# Patient Record
Sex: Female | Born: 1935 | Race: White | Hispanic: No | Marital: Single | Smoking: Never smoker
Health system: Southern US, Community
[De-identification: ages and names within clinical notes are randomized; demographics above are authoritative.]

## PROBLEM LIST (undated history)

## (undated) DIAGNOSIS — Z7901 Long term (current) use of anticoagulants: Secondary | ICD-10-CM

## (undated) DIAGNOSIS — I739 Peripheral vascular disease, unspecified: Secondary | ICD-10-CM

## (undated) DIAGNOSIS — M1990 Unspecified osteoarthritis, unspecified site: Secondary | ICD-10-CM

## (undated) DIAGNOSIS — E871 Hypo-osmolality and hyponatremia: Secondary | ICD-10-CM

## (undated) DIAGNOSIS — I10 Essential (primary) hypertension: Secondary | ICD-10-CM

## (undated) DIAGNOSIS — F0150 Vascular dementia without behavioral disturbance: Secondary | ICD-10-CM

## (undated) DIAGNOSIS — F411 Generalized anxiety disorder: Secondary | ICD-10-CM

## (undated) DIAGNOSIS — E876 Hypokalemia: Secondary | ICD-10-CM

## (undated) DIAGNOSIS — N183 Chronic kidney disease, stage 3 unspecified: Secondary | ICD-10-CM

## (undated) DIAGNOSIS — I2699 Other pulmonary embolism without acute cor pulmonale: Secondary | ICD-10-CM

## (undated) DIAGNOSIS — E785 Hyperlipidemia, unspecified: Secondary | ICD-10-CM

## (undated) DIAGNOSIS — Z86711 Personal history of pulmonary embolism: Secondary | ICD-10-CM

## (undated) DIAGNOSIS — K219 Gastro-esophageal reflux disease without esophagitis: Secondary | ICD-10-CM

## (undated) DIAGNOSIS — I252 Old myocardial infarction: Secondary | ICD-10-CM

## (undated) DIAGNOSIS — E039 Hypothyroidism, unspecified: Secondary | ICD-10-CM

## (undated) DIAGNOSIS — F329 Major depressive disorder, single episode, unspecified: Secondary | ICD-10-CM

## (undated) DIAGNOSIS — Z9181 History of falling: Secondary | ICD-10-CM

## (undated) HISTORY — DX: Unspecified osteoarthritis, unspecified site: M19.90

## (undated) HISTORY — DX: Gastro-esophageal reflux disease without esophagitis: K21.9

## (undated) HISTORY — DX: Major depressive disorder, single episode, unspecified: F32.9

## (undated) HISTORY — DX: Generalized anxiety disorder: F41.1

## (undated) HISTORY — DX: Vascular dementia without behavioral disturbance: F01.50

## (undated) HISTORY — DX: Vascular dementia, unspecified severity, without behavioral disturbance, psychotic disturbance, mood disturbance, and anxiety: F01.50

## (undated) HISTORY — DX: Chronic kidney disease, stage 3 (moderate): N18.3

## (undated) HISTORY — DX: Hypo-osmolality and hyponatremia: E87.1

## (undated) HISTORY — DX: Hyperlipidemia, unspecified: E78.5

## (undated) HISTORY — PX: JOINT REPLACEMENT: SHX530

## (undated) HISTORY — DX: Old myocardial infarction: I25.2

## (undated) HISTORY — DX: Peripheral vascular disease, unspecified: I73.9

## (undated) HISTORY — DX: Personal history of pulmonary embolism: Z86.711

## (undated) HISTORY — DX: Long term (current) use of anticoagulants: Z79.01

## (undated) HISTORY — DX: Other pulmonary embolism without acute cor pulmonale: I26.99

## (undated) HISTORY — DX: Chronic kidney disease, stage 3 unspecified: N18.30

## (undated) HISTORY — DX: Essential (primary) hypertension: I10

## (undated) HISTORY — DX: History of falling: Z91.81

## (undated) HISTORY — DX: Hypokalemia: E87.6

## (undated) HISTORY — DX: Hypothyroidism, unspecified: E03.9

---

## 2009-12-08 ENCOUNTER — Emergency Department (HOSPITAL_BASED_OUTPATIENT_CLINIC_OR_DEPARTMENT_OTHER)
Admission: EM | Admit: 2009-12-08 | Discharge: 2009-12-08 | Payer: Self-pay | Source: Home / Self Care | Admitting: Emergency Medicine

## 2010-04-04 ENCOUNTER — Ambulatory Visit: Payer: Self-pay | Admitting: Diagnostic Radiology

## 2010-04-04 ENCOUNTER — Emergency Department (HOSPITAL_BASED_OUTPATIENT_CLINIC_OR_DEPARTMENT_OTHER): Admission: EM | Admit: 2010-04-04 | Discharge: 2010-04-04 | Payer: Self-pay | Admitting: Emergency Medicine

## 2010-07-22 ENCOUNTER — Emergency Department (HOSPITAL_BASED_OUTPATIENT_CLINIC_OR_DEPARTMENT_OTHER)
Admission: EM | Admit: 2010-07-22 | Discharge: 2010-07-22 | Payer: Self-pay | Source: Home / Self Care | Admitting: Emergency Medicine

## 2010-08-28 ENCOUNTER — Emergency Department (HOSPITAL_BASED_OUTPATIENT_CLINIC_OR_DEPARTMENT_OTHER)
Admission: EM | Admit: 2010-08-28 | Discharge: 2010-08-29 | Disposition: A | Discharge status: Home / Self Care | Payer: MEDICARE | Attending: Emergency Medicine | Admitting: Emergency Medicine

## 2010-08-28 ENCOUNTER — Emergency Department (INDEPENDENT_AMBULATORY_CARE_PROVIDER_SITE_OTHER): Payer: MEDICARE

## 2010-08-28 DIAGNOSIS — E876 Hypokalemia: Secondary | ICD-10-CM | POA: Insufficient documentation

## 2010-08-28 DIAGNOSIS — K219 Gastro-esophageal reflux disease without esophagitis: Secondary | ICD-10-CM | POA: Insufficient documentation

## 2010-08-28 DIAGNOSIS — E785 Hyperlipidemia, unspecified: Secondary | ICD-10-CM | POA: Insufficient documentation

## 2010-08-28 DIAGNOSIS — E8779 Other fluid overload: Secondary | ICD-10-CM | POA: Insufficient documentation

## 2010-08-28 DIAGNOSIS — M810 Age-related osteoporosis without current pathological fracture: Secondary | ICD-10-CM | POA: Insufficient documentation

## 2010-08-28 DIAGNOSIS — E039 Hypothyroidism, unspecified: Secondary | ICD-10-CM | POA: Insufficient documentation

## 2010-08-28 DIAGNOSIS — I10 Essential (primary) hypertension: Secondary | ICD-10-CM | POA: Insufficient documentation

## 2010-08-28 DIAGNOSIS — F0390 Unspecified dementia without behavioral disturbance: Secondary | ICD-10-CM | POA: Insufficient documentation

## 2010-08-28 LAB — BASIC METABOLIC PANEL
Calcium: 9.6 mg/dL (ref 8.4–10.5)
Creatinine, Ser: 1 mg/dL (ref 0.4–1.2)
GFR calc non Af Amer: 54 mL/min — ABNORMAL LOW (ref >60)
Glucose, Bld: 89 mg/dL (ref 70–99)
Sodium: 138 mEq/L (ref 135–145)

## 2010-10-04 LAB — APTT: aPTT: 37 seconds (ref 24–37)

## 2010-10-04 LAB — URINALYSIS, ROUTINE W REFLEX MICROSCOPIC
Bilirubin Urine: NEGATIVE
Leukocytes, UA: NEGATIVE
Nitrite: NEGATIVE
Specific Gravity, Urine: 1.011 (ref 1.005–1.030)
Urobilinogen, UA: 0.2 mg/dL (ref 0.0–1.0)
pH: 6.5 (ref 5.0–8.0)

## 2010-10-04 LAB — DIFFERENTIAL
Basophils Absolute: 0.1 10*3/uL (ref 0.0–0.1)
Basophils Relative: 3 % — ABNORMAL HIGH (ref 0–1)
Eosinophils Absolute: 0 10*3/uL (ref 0.0–0.7)
Monocytes Absolute: 0.4 10*3/uL (ref 0.1–1.0)
Monocytes Relative: 9 % (ref 3–12)
Neutro Abs: 2.9 10*3/uL (ref 1.7–7.7)
Neutrophils Relative %: 66 % (ref 43–77)

## 2010-10-04 LAB — URINE MICROSCOPIC-ADD ON

## 2010-10-04 LAB — BASIC METABOLIC PANEL
BUN: 23 mg/dL (ref 6–23)
CO2: 30 mEq/L (ref 19–32)
Calcium: 9.5 mg/dL (ref 8.4–10.5)
Creatinine, Ser: 0.8 mg/dL (ref 0.4–1.2)
GFR calc non Af Amer: >60 mL/min (ref >60)
Glucose, Bld: 104 mg/dL — ABNORMAL HIGH (ref 70–99)

## 2010-10-04 LAB — URINE CULTURE

## 2010-10-04 LAB — CBC
MCH: 31.1 pg (ref 26.0–34.0)
MCHC: 34.8 g/dL (ref 30.0–36.0)
RDW: 13.7 % (ref 11.5–15.5)

## 2010-10-04 LAB — PROTIME-INR: INR: 1.83 — ABNORMAL HIGH (ref 0.00–1.49)

## 2010-10-08 LAB — CBC
HCT: 40.2 % (ref 36.0–46.0)
Hemoglobin: 14.1 g/dL (ref 12.0–15.0)
MCHC: 34.9 g/dL (ref 30.0–36.0)
Platelets: 275 10*3/uL (ref 150–400)
RDW: 13.1 % (ref 11.5–15.5)

## 2010-10-08 LAB — DIFFERENTIAL
Basophils Absolute: 0.1 10*3/uL (ref 0.0–0.1)
Basophils Relative: 1 % (ref 0–1)
Eosinophils Relative: 2 % (ref 0–5)
Lymphocytes Relative: 18 % (ref 12–46)
Monocytes Absolute: 0.5 10*3/uL (ref 0.1–1.0)

## 2011-01-15 IMAGING — CT CT HEAD W/O CM
4 of 7 series · 15 of 47 positions shown, 16 images · non-contrast
Comparison: None.

CT HEAD

CLINICAL DATA: Fall, pain

CT HEAD WITHOUT CONTRAST
CT CERVICAL SPINE WITHOUT CONTRAST
TECHNIQUE: Multidetector CT imaging of the head and cervical spine
was performed following the standard protocol without intravenous
contrast.  Multiplanar CT image reconstructions of the cervical
spine were also generated.

[Series 3: head 4.8 h37s · axial · 0.46mm/px · z∈[-134,+25]mm · 3 of 32 slices shown, 4 images]
[im 1/32  brain]
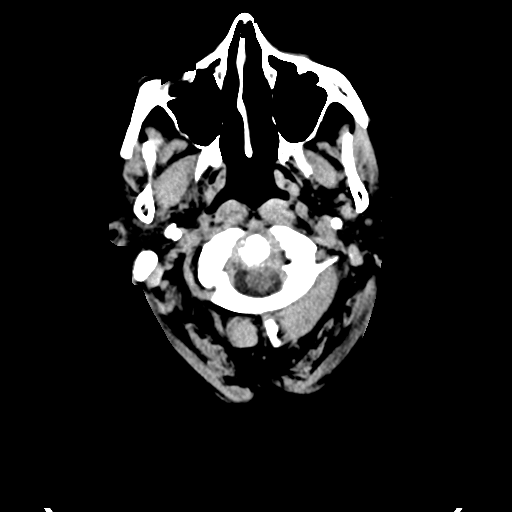
[im 1/32  bone]
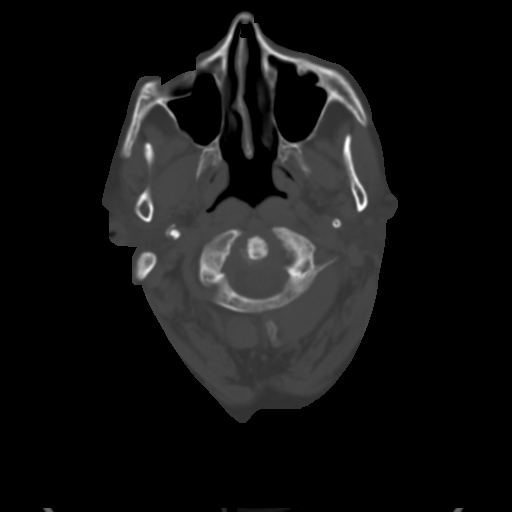
[im 16/32  brain]
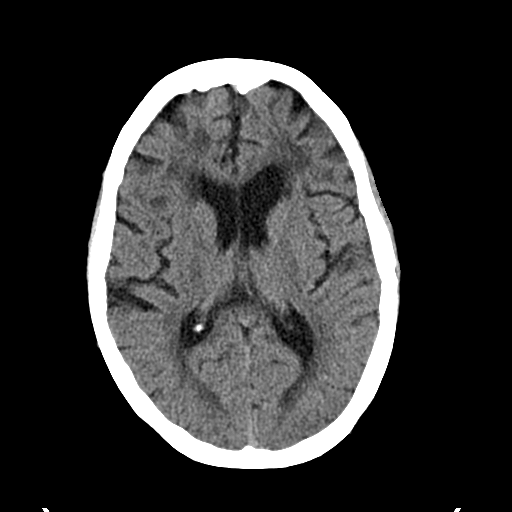
[im 32/32  brain]
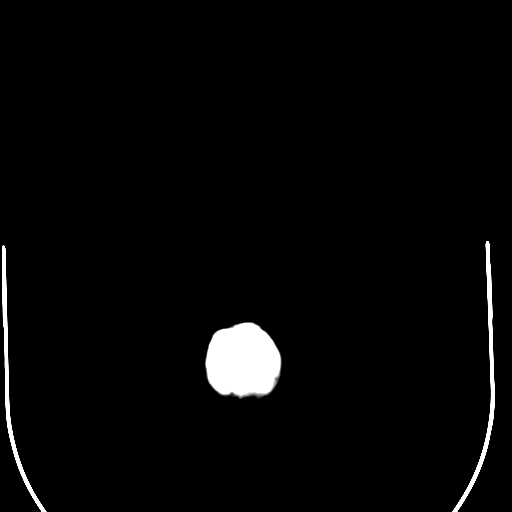

[Series 4: head 2.4 h60s bone · axial · 0.46mm/px · z∈[-112,+3]mm · 6 of 64 slices shown]
[im 10/64  bone]
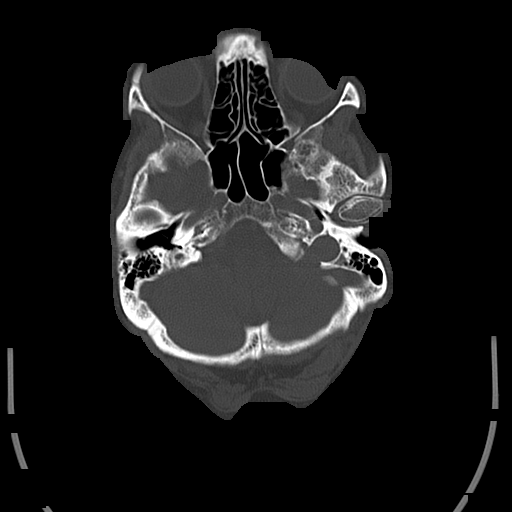
[im 19/64  bone]
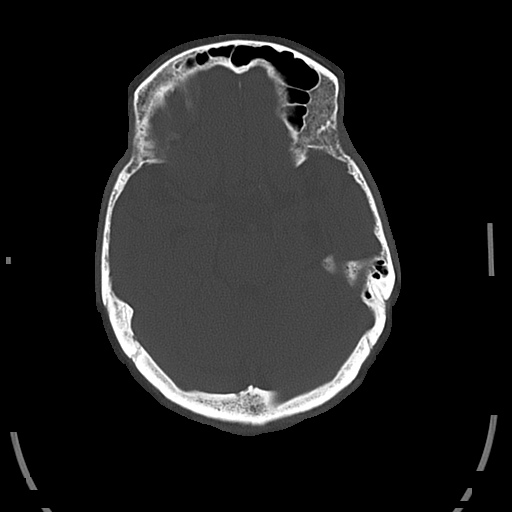
[im 28/64  bone]
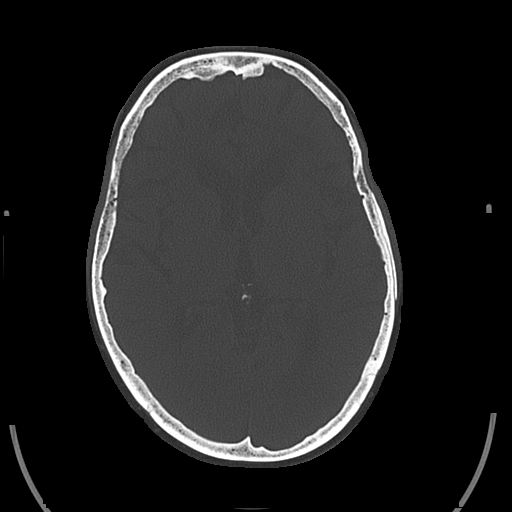
[im 37/64  bone]
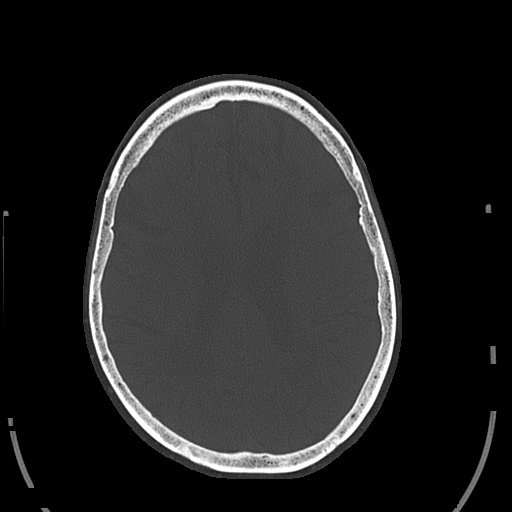
[im 46/64  bone]
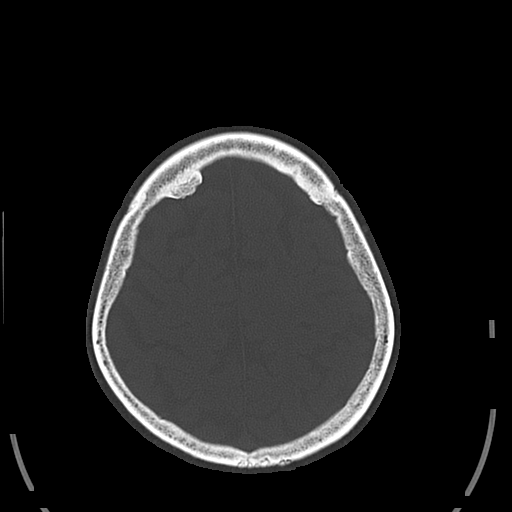
[im 55/64  bone]
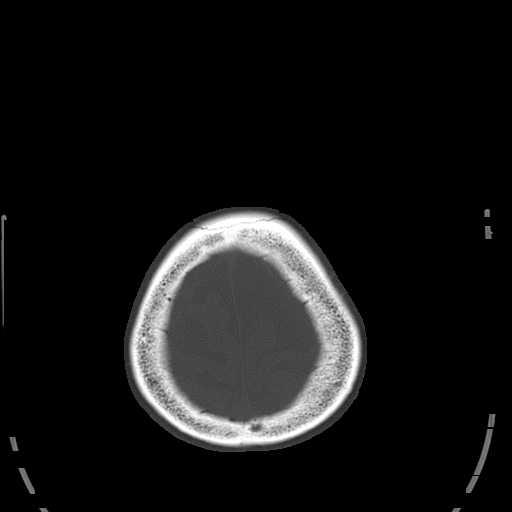

[Series 9: c_spine 2.0 sagittal · sagittal · 0.26mm/px · 3 of 44 slices shown]
[im 15/44  brain]
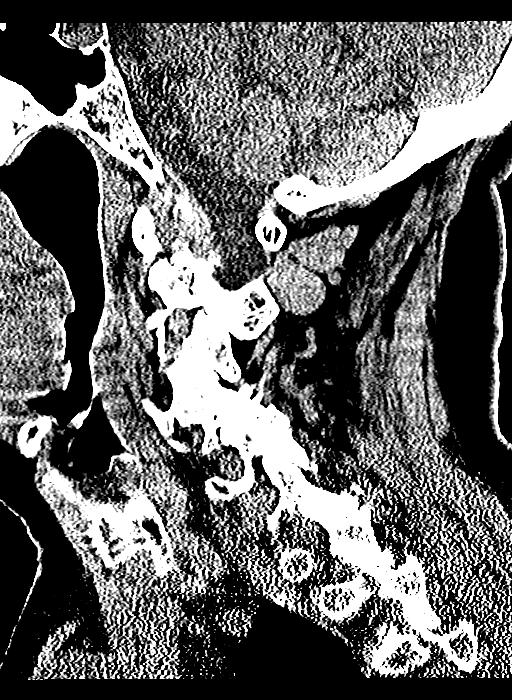
[im 22/44  brain]
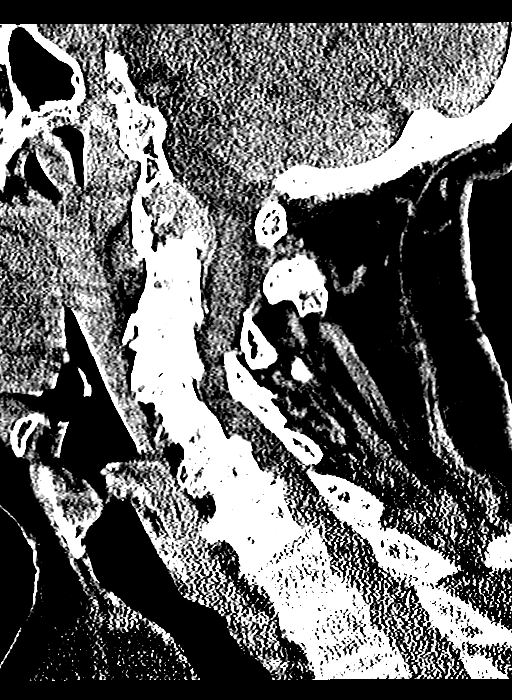
[im 29/44  brain]
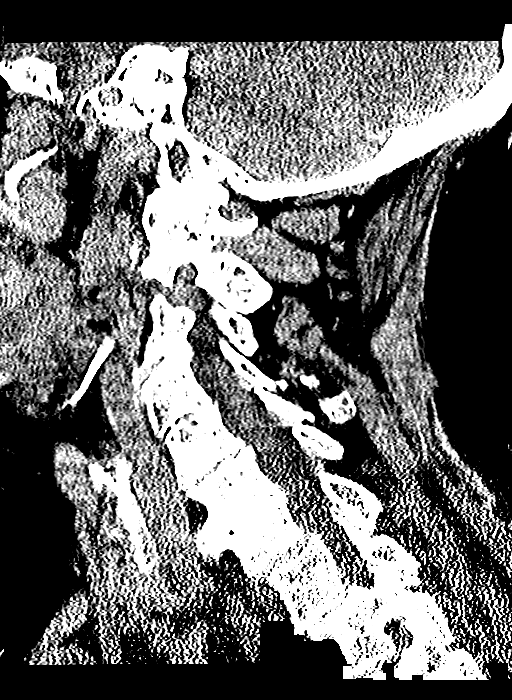

[Series 11: c_spine 2.0 (id) · coronal · 0.24mm/px · 3 of 54 slices shown]
[im 11/54  brain]
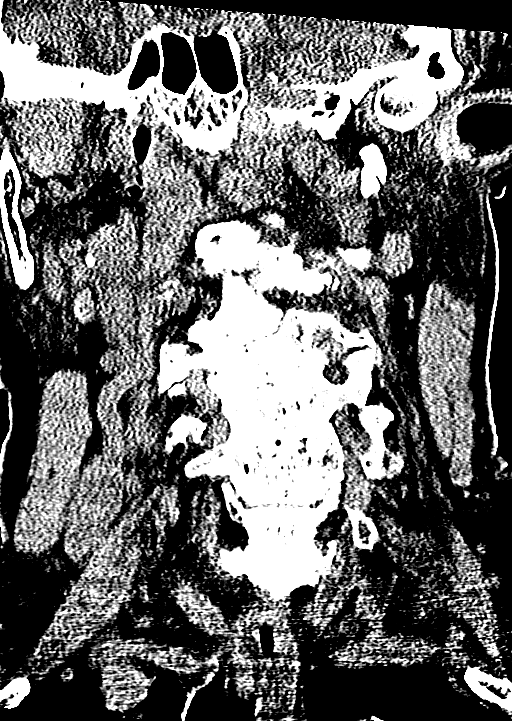
[im 22/54  brain]
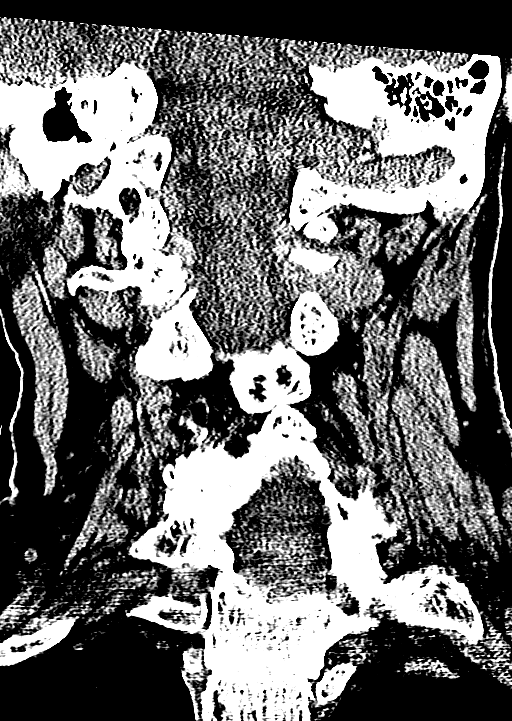
[im 32/54  brain]
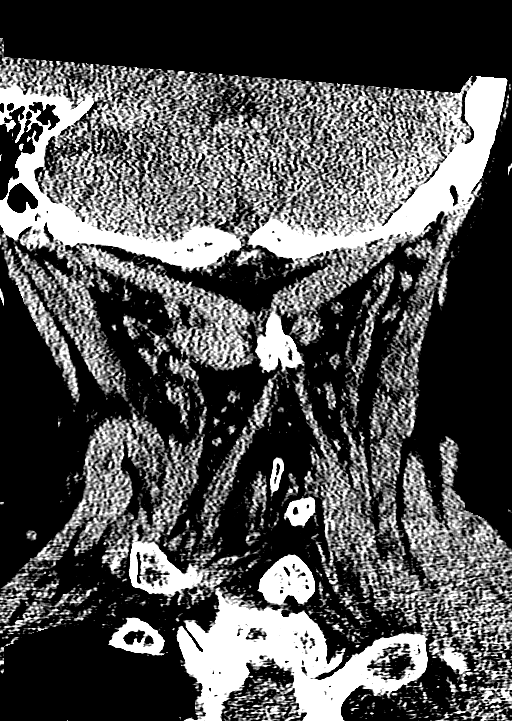

[15 of 47 positions shown; findings below may reference images not displayed]

FINDINGS: Cortical volume loss noted with proportional ventricular
prominence.  Periventricular white matter hypodensity likely
indicates small vessel ischemic change.  No acute hemorrhage, acute
infarction, or mass lesion is identified.  Tiny punctate hypodense
areas within the posterior limbs of the bilateral internal capsules
could reflect small vessel ischemic change or tiny lacunar
infarcts.  No midline shift.  Orbits and paranasal sinuses are
normal in appearance.
IMPRESSION: No acute intracranial finding.

CT CERVICAL SPINE
FINDINGS: C1 through the cervical thoracic junction is visualized
in its entirety.  Minimal loss of vertebral body height is noted at
C7 without definite overt acute compression fracture.  Marked
multilevel disc degenerative change is noted, with leftward
curvature centered at C5.  3 mm retrolisthesis of C3 on C4 noted.
3 mm retrolisthesis of T1 on C7 noted.  Diffuse loss of
intervertebral disc space noted.  Multilevel facet osteoarthritic
change is present. No precervical soft tissue widening is present.
IMPRESSION: Multilevel severe degenerative change, no acute osseous
abnormality.

## 2011-02-21 IMAGING — US US ABDOMEN COMPLETE
1 series · 14 of 25 positions shown · non-contrast
Comparison: None.

CLINICAL DATA: The

ABDOMINAL ULTRASOUND COMPLETE

[Series 1: us abdomen complete · 0.24mm/px · 14 of 66 slices shown]
[im 1/66]
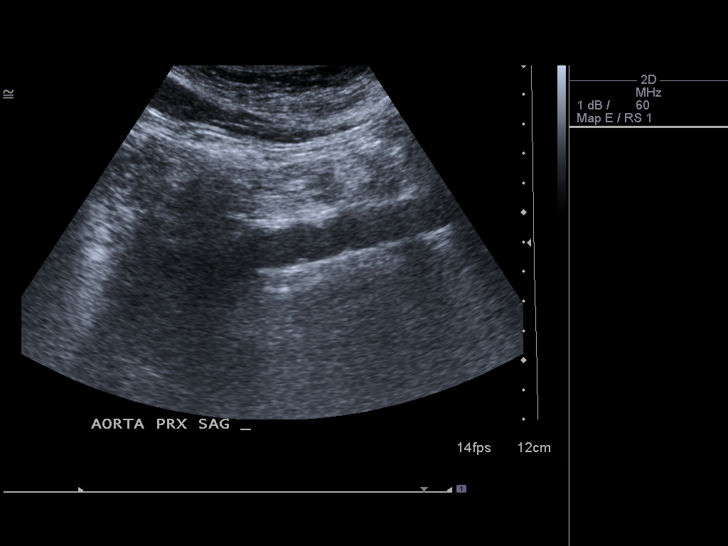
[im 6/66]
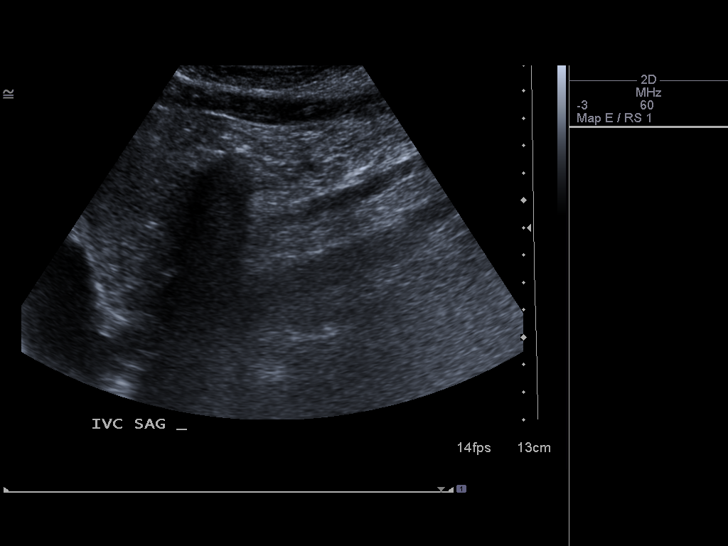
[im 11/66]
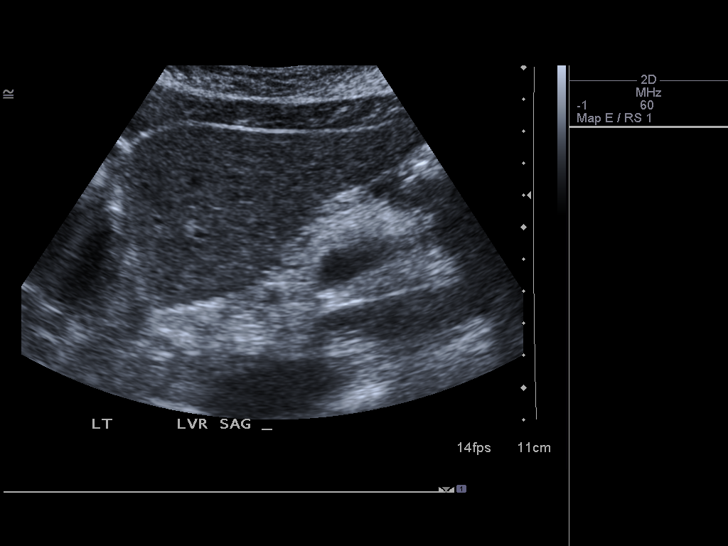
[im 17/66]
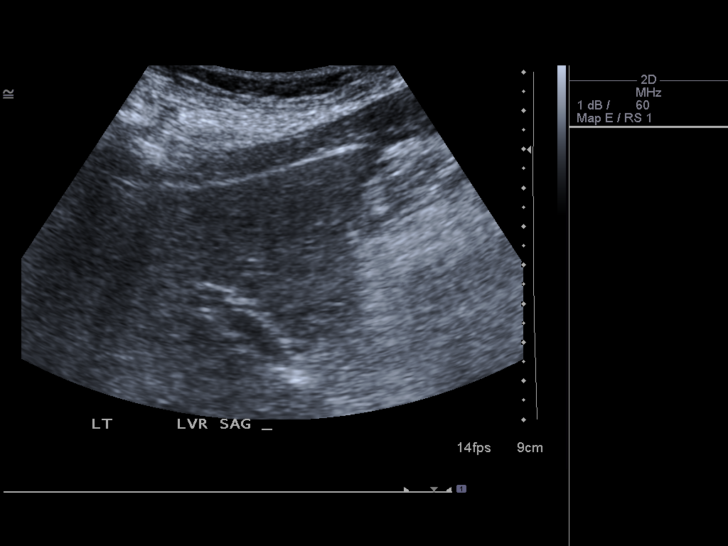
[im 22/66]
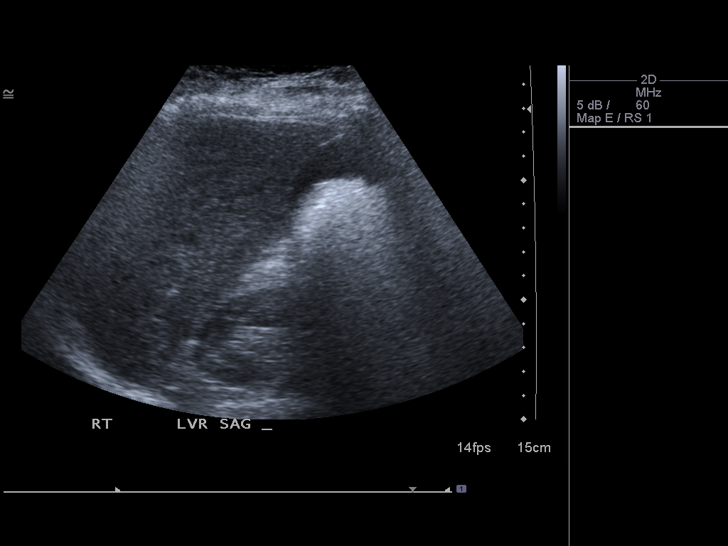
[im 25/66]
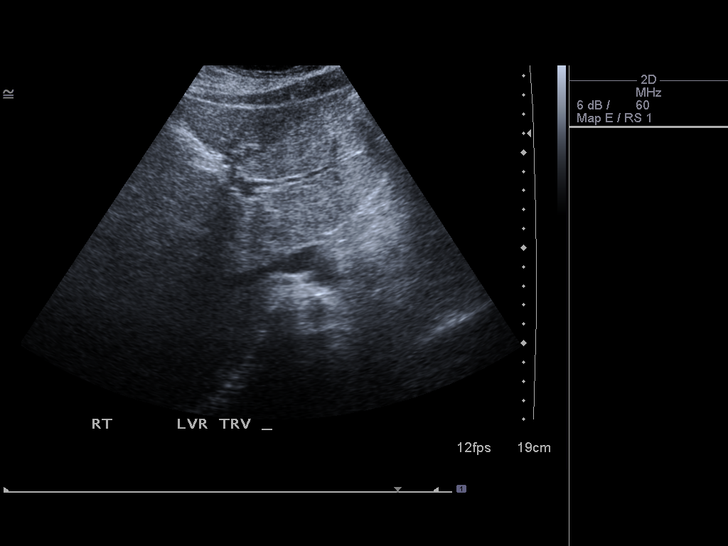
[im 30/66]
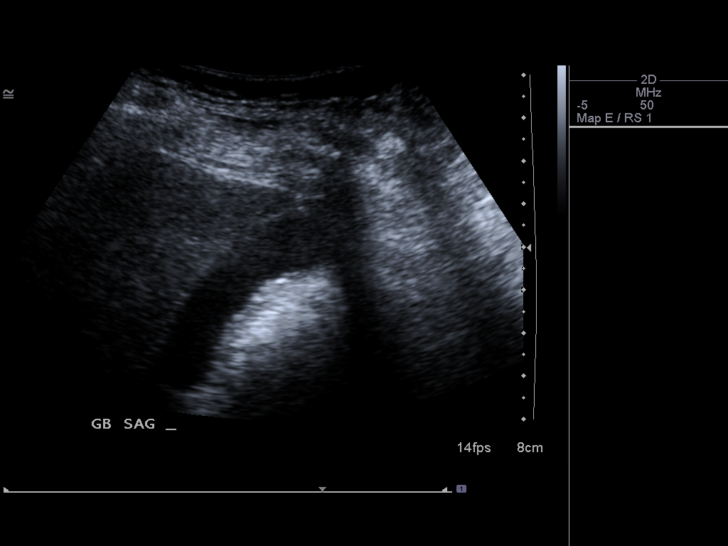
[im 36/66]
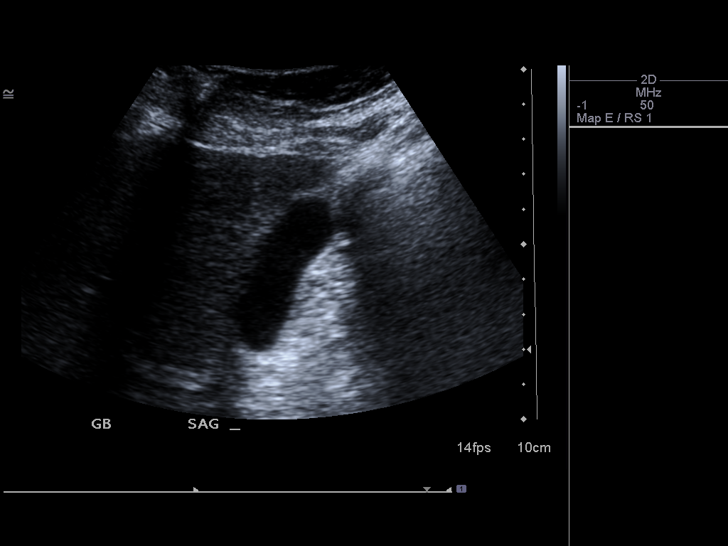
[im 41/66]
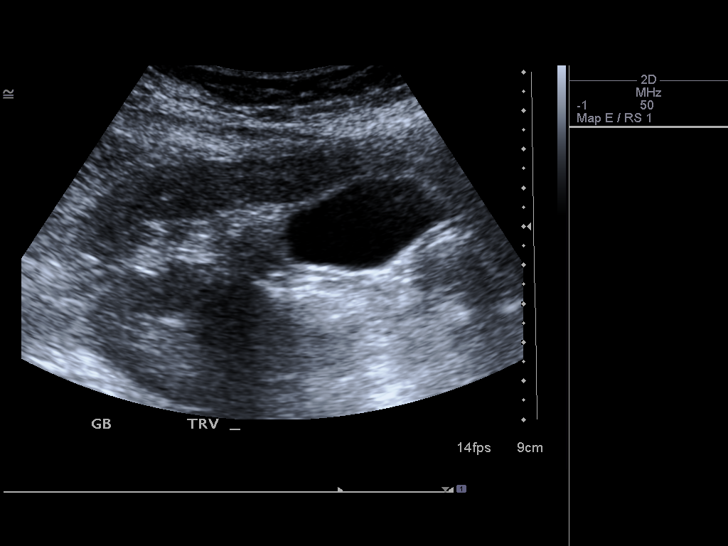
[im 44/66]
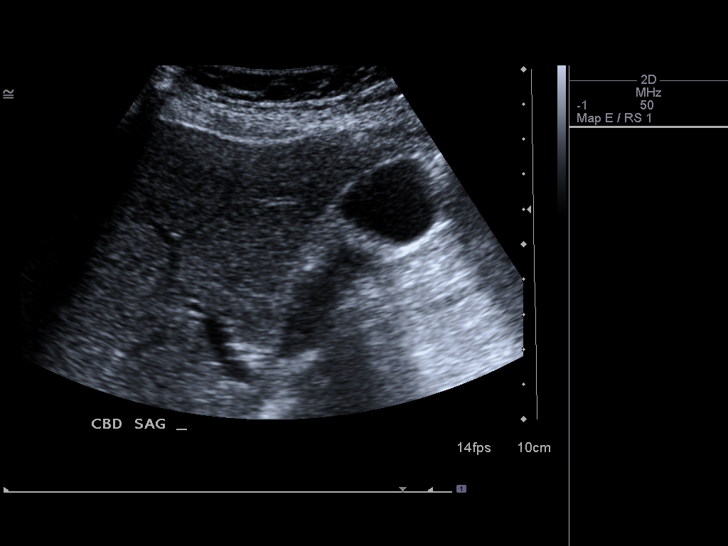
[im 49/66]
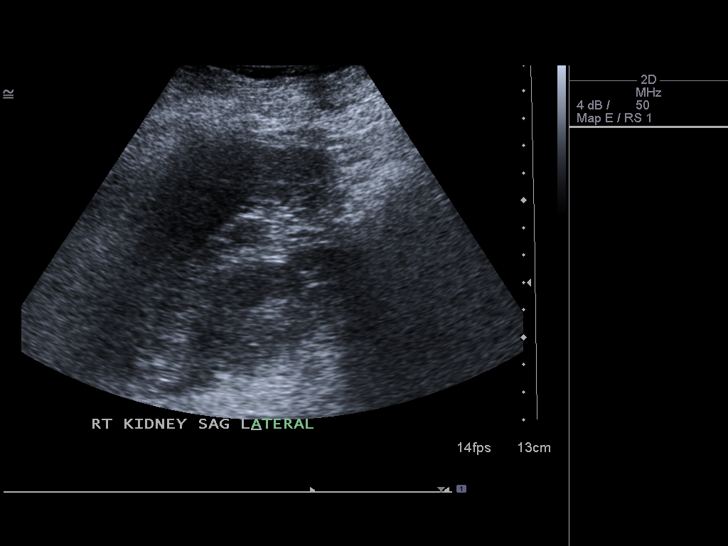
[im 55/66]
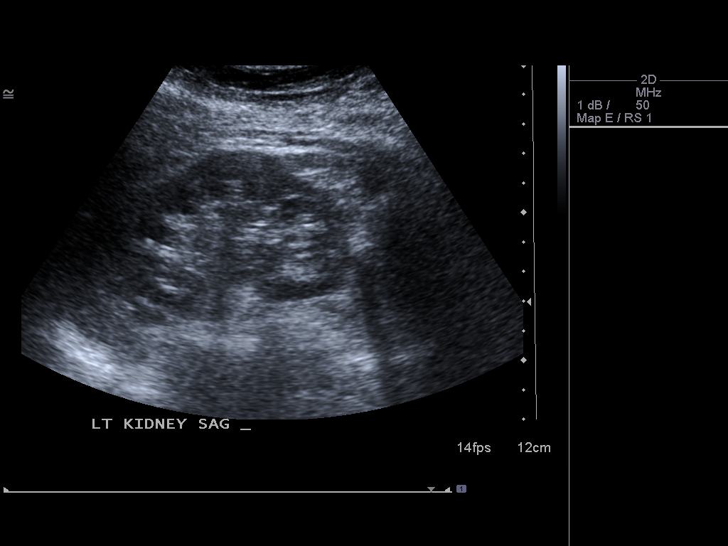
[im 60/66]
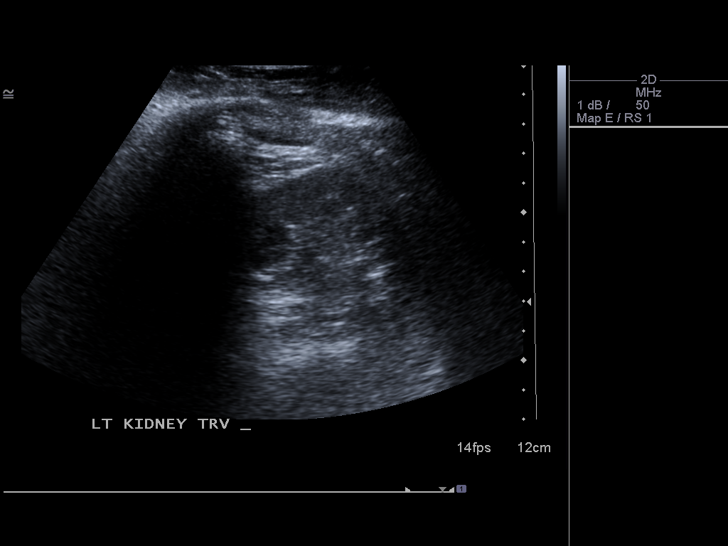
[im 66/66]
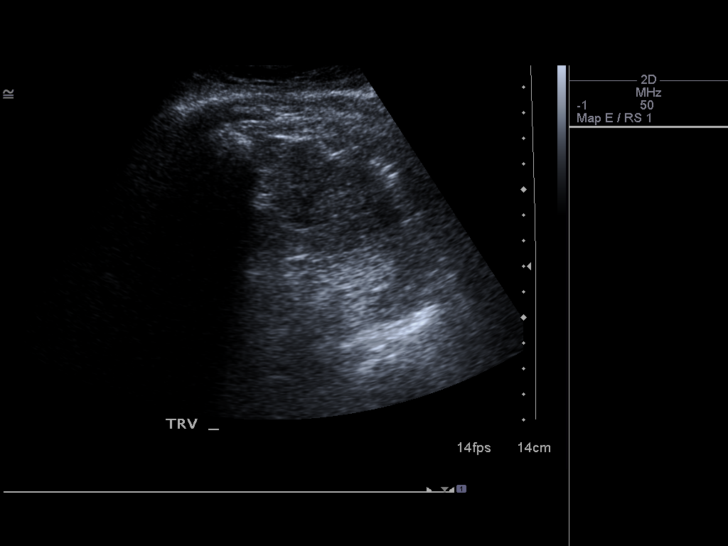

[14 of 25 positions shown; findings below may reference images not displayed]

FINDINGS: Gallbladder:  No gallstones, gallbladder wall thickening, or
pericholecystic fluid.

Common Bile Duct:  Within normal limits in caliber.1.2 mm.

Liver: No focal mass lesion identified.  Within normal limits in
parenchymal echogenicity.

IVC:  Limited visualization due to bowel gas.

Pancreas:  Limited visualization due to bowel gas.Visualized
portion shows no abnormality.

Spleen:  Within normal limits in size and echotexture.

Right kidney:  Normal in size and parenchymal echogenicity.  No
evidence of mass or hydronephrosis.

Left kidney:  Normal in size and parenchymal echogenicity.  No
evidence of mass or hydronephrosis.

Abdominal Aorta:  No aneurysm identified.

No ascites is identified.
IMPRESSION: Negative abdominal ultrasound.

Evaluation of pancreas and inferior vena cava is limited by bowel
gas.

## 2012-10-08 ENCOUNTER — Non-Acute Institutional Stay (SKILLED_NURSING_FACILITY): Payer: Medicare Other | Admitting: Internal Medicine

## 2012-10-08 DIAGNOSIS — Z7901 Long term (current) use of anticoagulants: Secondary | ICD-10-CM

## 2012-10-08 DIAGNOSIS — I2699 Other pulmonary embolism without acute cor pulmonale: Secondary | ICD-10-CM

## 2012-10-12 ENCOUNTER — Non-Acute Institutional Stay (SKILLED_NURSING_FACILITY): Payer: Medicare Other | Admitting: Adult Health

## 2012-10-12 ENCOUNTER — Encounter: Payer: Self-pay | Admitting: Adult Health

## 2012-10-12 DIAGNOSIS — I10 Essential (primary) hypertension: Secondary | ICD-10-CM

## 2012-10-12 DIAGNOSIS — F29 Unspecified psychosis not due to a substance or known physiological condition: Secondary | ICD-10-CM | POA: Insufficient documentation

## 2012-10-12 DIAGNOSIS — Z7901 Long term (current) use of anticoagulants: Secondary | ICD-10-CM

## 2012-10-12 DIAGNOSIS — E871 Hypo-osmolality and hyponatremia: Secondary | ICD-10-CM

## 2012-10-12 DIAGNOSIS — I2699 Other pulmonary embolism without acute cor pulmonale: Secondary | ICD-10-CM

## 2012-10-12 DIAGNOSIS — E039 Hypothyroidism, unspecified: Secondary | ICD-10-CM

## 2012-10-12 DIAGNOSIS — F0150 Vascular dementia without behavioral disturbance: Secondary | ICD-10-CM

## 2012-10-12 DIAGNOSIS — E876 Hypokalemia: Secondary | ICD-10-CM

## 2012-10-12 DIAGNOSIS — K219 Gastro-esophageal reflux disease without esophagitis: Secondary | ICD-10-CM

## 2012-10-12 DIAGNOSIS — F329 Major depressive disorder, single episode, unspecified: Secondary | ICD-10-CM | POA: Insufficient documentation

## 2012-10-12 DIAGNOSIS — N390 Urinary tract infection, site not specified: Secondary | ICD-10-CM

## 2012-10-12 DIAGNOSIS — Z86711 Personal history of pulmonary embolism: Secondary | ICD-10-CM

## 2012-10-12 DIAGNOSIS — B9620 Unspecified Escherichia coli [E. coli] as the cause of diseases classified elsewhere: Secondary | ICD-10-CM | POA: Insufficient documentation

## 2012-10-12 DIAGNOSIS — F32A Depression, unspecified: Secondary | ICD-10-CM

## 2012-10-12 HISTORY — DX: Gastro-esophageal reflux disease without esophagitis: K21.9

## 2012-10-12 HISTORY — DX: Depression, unspecified: F32.A

## 2012-10-12 HISTORY — DX: Long term (current) use of anticoagulants: Z79.01

## 2012-10-12 HISTORY — DX: Personal history of pulmonary embolism: Z86.711

## 2012-10-12 HISTORY — DX: Vascular dementia, unspecified severity, without behavioral disturbance, psychotic disturbance, mood disturbance, and anxiety: F01.50

## 2012-10-12 MED ORDER — WARFARIN SODIUM 1 MG PO TABS: 7.5000 mg | ORAL_TABLET | Freq: Every day | ORAL | Status: DC

## 2012-10-12 NOTE — Progress Notes (Signed)
Patient ID: Tricia Martin, female   DOB: [birth year], 79 y.o.   MRN: [id number]           PROGRESS NOTE  DATE:  10/08/2012  FACILITY: [name]   LEVEL OF CARE: SNF  Acute Visit  CHIEF COMPLAINT:  Manage anticoagulation.    HISTORY OF PRESENT ILLNESS:  Patient had an acute PE.  She was started on anticoagulation.  She denies chest pain, shortness of breath or calf pain.    Anticoagulation.  INR today is 1.7.  On 10/05/2012, INR was 1.5.  Therefore, Coumadin was increased from 6 mg to 7 mg.  She denies any bleeding.    REVIEW OF SYSTEMS: GENERAL: no change in appetite, no fatigue, no weight changes, no fever, chills or weakness RESPIRATORY: no cough, SOB, DOE,, wheezing, hemoptysis CARDIAC: no chest pain, edema or palpitations  PHYSICAL EXAMINATION GENERAL: no acute distress, normal body habitus RESPIRATORY: breathing is even & unlabored, BS CTAB CARDIAC: RRR, no murmur,no extra heart sounds, no edema GI: abdomen soft, normal BS, no masses, no tenderness, no hepatomegaly, no splenomegaly PSYCHIATRIC: the patient is alert & oriented to person, affect & behavior appropriate  ASSESSMENT/PLAN:  PE 415.1.  Stable.    V58.61.  INR is subtherapeutic and unstable.  Continue Coumadin at 7 mg q.d. and check INR on 10/12/2012.      CPT CODE: 40981.

## 2012-10-12 NOTE — Progress Notes (Signed)
Subjective:     Patient ID: Tricia Martin, female   DOB: February 20, 1931, 79 y.o.   MRN: [id number] Chief Complaint  Patient presents with  . Anticoagulation  . Medical Managment of Chronic Issues   HPI  Pulmonary embolus There are no reports of shortness of breath or chest present she is on long term coumadin therapy.   Long term (current) use of anticoagulants Her inr is 1.8 and she is taking coumadin 7 mg daily   No past medical history on file.  Current outpatient prescriptions:acetaminophen (TYLENOL) 650 MG CR tablet, Take 650 mg by mouth 2 (two) times daily., Disp: , Rfl: ;  carbamazepine (TEGRETOL) 200 MG tablet, Take 200 mg by mouth 2 (two) times daily., Disp: , Rfl: ;  diphenoxylate-atropine (LOMOTIL) 2.5-0.025 MG per tablet, Take 1 tablet by mouth 4 (four) times daily as needed for diarrhea or loose stools., Disp: , Rfl:  docusate sodium (COLACE) 100 MG capsule, Take 100 mg by mouth 2 (two) times daily., Disp: , Rfl: ;  famotidine (PEPCID) 20 MG tablet, Take 20 mg by mouth at bedtime., Disp: , Rfl: ;  fish oil-omega-3 fatty acids 1000 MG capsule, Take 1 g by mouth daily., Disp: , Rfl: ;  furosemide (LASIX) 20 MG tablet, Take 20 mg by mouth daily., Disp: , Rfl:  HYDROcodone-acetaminophen (NORCO/VICODIN) 5-325 MG per tablet, Take 1 tablet by mouth every 4 (four) hours as needed for pain., Disp: , Rfl: ;  levothyroxine (SYNTHROID, LEVOTHROID) 75 MCG tablet, Take 75 mcg by mouth daily., Disp: , Rfl: ;  lisinopril (PRINIVIL,ZESTRIL) 10 MG tablet, Take 10 mg by mouth daily., Disp: , Rfl:  LORazepam (ATIVAN) 1 MG tablet, Take 1 mg by mouth every 6 (six) hours as needed for anxiety. Take 1 mg daily and every 6 hours as needed, Disp: , Rfl: ;  memantine (NAMENDA) 10 MG tablet, Take 10 mg by mouth 2 (two) times daily., Disp: , Rfl: ;  Multiple Vitamin (MULTIVITAMIN WITH MINERALS) TABS, Take 1 tablet by mouth daily., Disp: , Rfl: ;  perphenazine (TRILAFON) 2 MG tablet, Take 2 mg by mouth at bedtime.,  Disp: , Rfl:  polyethylene glycol (MIRALAX / GLYCOLAX) packet, Take 17 g by mouth daily., Disp: , Rfl: ;  potassium chloride SA (K-DUR,KLOR-CON) 20 MEQ tablet, Take 20 mEq by mouth daily., Disp: , Rfl: ;  sodium chloride 1 G tablet, Take 1 g by mouth 2 (two) times daily., Disp: , Rfl: ;  venlafaxine XR (EFFEXOR-XR) 75 MG 24 hr capsule, Take 75 mg by mouth daily., Disp: , Rfl: ;  warfarin (COUMADIN) 1 MG tablet, Take 7 mg by mouth daily., Disp: , Rfl:  Review of Systems  Constitutional: Negative for appetite change.  Respiratory: Negative for cough, shortness of breath and wheezing.   Cardiovascular: Negative for chest pain and palpitations.  Gastrointestinal: Negative for abdominal pain and constipation.  Skin: Negative.   Neurological: Negative for headaches.  Psychiatric/Behavioral: Negative.    Filed Vitals:   10/12/12 1951  BP: 104/71  Pulse: 77  Weight: 170 lb (77.111 kg)      Objective:   Physical Exam  Constitutional: She appears well-developed and well-nourished.  Neck: Neck supple.  Cardiovascular: Normal rate, regular rhythm and intact distal pulses.   Pulmonary/Chest: Effort normal and breath sounds normal.  Abdominal: Soft. Bowel sounds are normal.  Musculoskeletal: Normal range of motion.  Neurological: She is alert.  Skin: Skin is warm and dry.  Psychiatric: She has a normal mood and affect.  lab reviewed:  09-24-12: wbc 6.3; hgb 8.9; hct 27.6 ;mcv 84.1 plt 232; glucose 89; bun 36; creat 1.73; k+ 3.7; na++ 143 Alk phos 68; alt <8 ast 11; albumin 3.2  10-12-12: inr: 1.8 on 7 mg coumadin daily      Assessment:    PE: long term anticoagulation therapy     Plan:   will change her coumadin to 7.5 mg daily will check inr in one week.  Will continue to monitor her status

## 2012-10-12 NOTE — Assessment & Plan Note (Signed)
Her inr is 1.8 and she is taking coumadin 7 mg daily

## 2012-10-12 NOTE — Assessment & Plan Note (Signed)
There are no reports of shortness of breath or chest present she is on long term coumadin therapy.

## 2012-11-12 ENCOUNTER — Non-Acute Institutional Stay (SKILLED_NURSING_FACILITY): Payer: Medicare Other | Admitting: Internal Medicine

## 2012-11-12 DIAGNOSIS — R569 Unspecified convulsions: Secondary | ICD-10-CM

## 2012-11-12 DIAGNOSIS — K5900 Constipation, unspecified: Secondary | ICD-10-CM

## 2012-11-12 DIAGNOSIS — I10 Essential (primary) hypertension: Secondary | ICD-10-CM

## 2012-11-12 DIAGNOSIS — E039 Hypothyroidism, unspecified: Secondary | ICD-10-CM

## 2012-11-25 ENCOUNTER — Other Ambulatory Visit: Payer: Self-pay | Admitting: Geriatric Medicine

## 2012-11-25 MED ORDER — LORAZEPAM 1 MG PO TABS: 0.5000 mg | ORAL_TABLET | Freq: Four times a day (QID) | ORAL | Status: DC | PRN

## 2012-11-25 MED ORDER — LORAZEPAM 1 MG PO TABS: 0.5000 mg | ORAL_TABLET | Freq: Every morning | ORAL | Status: DC

## 2012-11-26 ENCOUNTER — Non-Acute Institutional Stay (SKILLED_NURSING_FACILITY): Payer: Medicare Other | Admitting: Internal Medicine

## 2012-11-26 DIAGNOSIS — I2699 Other pulmonary embolism without acute cor pulmonale: Secondary | ICD-10-CM

## 2012-11-26 DIAGNOSIS — Z7901 Long term (current) use of anticoagulants: Secondary | ICD-10-CM

## 2012-11-27 DIAGNOSIS — K5900 Constipation, unspecified: Secondary | ICD-10-CM | POA: Insufficient documentation

## 2012-11-27 DIAGNOSIS — R569 Unspecified convulsions: Secondary | ICD-10-CM | POA: Insufficient documentation

## 2012-11-27 NOTE — Progress Notes (Signed)
Patient ID: Tricia Martin, female   DOB: May 15, 1931, 79 y.o.   MRN: [id number]        PROGRESS NOTE  DATE:  11/12/2012   FACILITY: Nursing Home Location: [facility]  LEVEL OF CARE: SNF (31)  Routine Visit  CHIEF COMPLAINT:  Manage hypothyroidism and seizure disorder.    HISTORY OF PRESENT ILLNESS:  REASSESSMENT OF ONGOING PROBLEM(S):  HYPOTHYROIDISM: The hypothyroidism remains stable. No complications noted from the medications presently being used.  The patient denies fatigue or constipation.  Last TSH 1.648 in 08/2012.    SEIZURE DISORDER: The patient's seizure disorder remains stable. No complications reported from the medications presently being used. Staff do not report any recent seizure activity.   PAST MEDICAL HISTORY : Reviewed.  No changes.  CURRENT MEDICATIONS: Reviewed per [facility]  REVIEW OF SYSTEMS:  GENERAL: no change in appetite, no fatigue, no weight changes, no fever, chills or weakness RESPIRATORY: no cough, SOB, DOE, wheezing, hemoptysis CARDIAC: no chest pain, edema or palpitations GI: no abdominal pain, diarrhea, constipation, heart burn, nausea or vomiting  PHYSICAL EXAMINATION  VS:  T 98.2      P 77     RR 18    BP 91/57    POX %     WT (Lb) 170.4  GENERAL: no acute distress, normal body habitus NECK: supple, trachea midline, no neck masses, no thyroid tenderness, no thyromegaly RESPIRATORY: breathing is even & unlabored, BS CTAB CARDIAC: RRR, no murmur,no extra heart sounds, no edema GI: abdomen soft, normal BS, no masses, no tenderness, no hepatomegaly, no splenomegaly PSYCHIATRIC: the patient is alert & oriented to person, affect & behavior appropriate  LABS/RADIOLOGY: 09/2012:  Glucose 106, total protein 5.7, albumin 3.3, otherwise CMP normal.    CBC normal.  08/2012:  Tegretol level 4.1.  ASSESSMENT/PLAN:  Seizure disorder.  Well controlled.   Hypothyroidism.  Well controlled.     Hypertension.  Well controlled.    Constipation.  Patient denies ongoing symptoms.   Depression.  Continue current medications.   GERD.  Well controlled.    Dementia.  Stable.   CPT CODE: 86578

## 2012-12-01 ENCOUNTER — Other Ambulatory Visit: Payer: Self-pay | Admitting: Geriatric Medicine

## 2012-12-01 MED ORDER — LORAZEPAM 1 MG PO TABS: 1.0000 mg | ORAL_TABLET | Freq: Four times a day (QID) | ORAL | Status: DC | PRN

## 2012-12-07 ENCOUNTER — Encounter: Payer: Self-pay | Admitting: Adult Health

## 2012-12-07 ENCOUNTER — Non-Acute Institutional Stay (SKILLED_NURSING_FACILITY): Payer: Medicare Other | Admitting: Adult Health

## 2012-12-07 DIAGNOSIS — I10 Essential (primary) hypertension: Secondary | ICD-10-CM

## 2012-12-07 DIAGNOSIS — K219 Gastro-esophageal reflux disease without esophagitis: Secondary | ICD-10-CM

## 2012-12-07 DIAGNOSIS — Z7901 Long term (current) use of anticoagulants: Secondary | ICD-10-CM

## 2012-12-07 DIAGNOSIS — I2699 Other pulmonary embolism without acute cor pulmonale: Secondary | ICD-10-CM

## 2012-12-07 DIAGNOSIS — E039 Hypothyroidism, unspecified: Secondary | ICD-10-CM

## 2012-12-07 DIAGNOSIS — F0150 Vascular dementia without behavioral disturbance: Secondary | ICD-10-CM

## 2012-12-07 DIAGNOSIS — E871 Hypo-osmolality and hyponatremia: Secondary | ICD-10-CM

## 2012-12-07 DIAGNOSIS — R609 Edema, unspecified: Secondary | ICD-10-CM

## 2012-12-07 DIAGNOSIS — F32A Depression, unspecified: Secondary | ICD-10-CM

## 2012-12-07 DIAGNOSIS — F329 Major depressive disorder, single episode, unspecified: Secondary | ICD-10-CM

## 2012-12-07 DIAGNOSIS — K5900 Constipation, unspecified: Secondary | ICD-10-CM

## 2012-12-07 DIAGNOSIS — M1990 Unspecified osteoarthritis, unspecified site: Secondary | ICD-10-CM

## 2012-12-07 NOTE — Progress Notes (Signed)
Patient ID: Tricia Martin, female   DOB: 07-18-1931, 79 y.o.   MRN: [id number]  [facility] Allergies  Allergen Reactions  . Ciprofloxacin Hcl   . Codeine   . Duloxetine Hcl   . Hctz (Hydrochlorothiazide)   . Metaxalone   . Morphine And Related   . Penicillins   . Quetiapine Fumarate   . Zofran (Ondansetron Hcl)      Chief Complaint  Patient presents with  . Medical Managment of Chronic Issues    HPI:  She is being seen for the management fo her chronic illnesses; there are no concerns being voiced by th nursing staff at this time. There are no concerns being voiced by the patient as well.    No past medical history on file.  No past surgical history on file.  VITAL SIGNS BP 132/80  Pulse 64  Ht 5\' 6"  (1.676 m)  Wt 175 lb 3.2 oz (79.47 kg)  BMI 28.29 kg/m2   Patient's Medications  New Prescriptions   No medications on file  Previous Medications   ACETAMINOPHEN (TYLENOL) 650 MG CR TABLET    Take 650 mg by mouth 2 (two) times daily.   CARBAMAZEPINE (TEGRETOL) 200 MG TABLET    Take 200 mg by mouth 2 (two) times daily.   DIPHENOXYLATE-ATROPINE (LOMOTIL) 2.5-0.025 MG PER TABLET    Take 1 tablet by mouth 4 (four) times daily as needed for diarrhea or loose stools.   DOCUSATE SODIUM (COLACE) 100 MG CAPSULE    Take 100 mg by mouth 2 (two) times daily.   FAMOTIDINE (PEPCID) 20 MG TABLET    Take 20 mg by mouth at bedtime.   FISH OIL-OMEGA-3 FATTY ACIDS 1000 MG CAPSULE    Take 1 g by mouth 2 (two) times daily.    FUROSEMIDE (LASIX) 20 MG TABLET    Take 20 mg by mouth daily.   HYDROCODONE-ACETAMINOPHEN (NORCO/VICODIN) 5-325 MG PER TABLET    Take 1 tablet by mouth every 4 (four) hours as needed for pain.   LEVOTHYROXINE (SYNTHROID, LEVOTHROID) 75 MCG TABLET    Take 75 mcg by mouth daily.   LISINOPRIL (PRINIVIL,ZESTRIL) 10 MG TABLET    Take 10 mg by mouth daily.   LORAZEPAM (ATIVAN) 1 MG TABLET    Take 1 tablet (1 mg total) by mouth every 6 (six) hours as needed for anxiety.   MEMANTINE (NAMENDA) 10 MG TABLET    Take 10 mg by mouth 2 (two) times daily.   MULTIPLE VITAMIN (MULTIVITAMIN WITH MINERALS) TABS    Take 1 tablet by mouth daily.   PERPHENAZINE (TRILAFON) 2 MG TABLET    Take 2 mg by mouth at bedtime.   POLYETHYLENE GLYCOL (MIRALAX / GLYCOLAX) PACKET    Take 17 g by mouth daily.   POTASSIUM CHLORIDE SA (K-DUR,KLOR-CON) 20 MEQ TABLET    Take 20 mEq by mouth daily.   SODIUM CHLORIDE 1 G TABLET    Take 1 g by mouth 2 (two) times daily.   VENLAFAXINE XR (EFFEXOR-XR) 75 MG 24 HR CAPSULE    Take 75 mg by mouth daily.  Modified Medications   Modified Medication Previous Medication   LORAZEPAM (ATIVAN) 0.5 MG TABLET LORazepam (ATIVAN) 0.5 MG tablet      Take 1 mg by mouth daily.    Take 1/2 tablet by mouth every morning   WARFARIN (COUMADIN) 1 MG TABLET warfarin (COUMADIN) 1 MG tablet      Take 11 mg by mouth daily.    Take  7.5 tablets (7.5 mg total) by mouth daily.  Discontinued Medications   No medications on file    SIGNIFICANT DIAGNOSTIC EXAMS    09-07-12: wbc 4.9; hgb 13.5; hct 38.8; mcv 89.2; plt 210; tsh 1.648; tegretol 4.1 09-21-12: wbc 4.8; hgb 12.6; hct 36.8; mcv 887. plt 271; glucose 106; bun 17; creat 0.85; k+ 3.9 Na++ 135; liver normal albumin 3.3  09-24-12: wbc 6.3; hgb 8.9; hct 27.6 ;mcv 84.1 plt 232; glucose 89; bun 36; creat 1.73; k+ 3.7; na++ 143 Alk phos 68; alt <8 ast 11; albumin 3.2  10-12-12: inr: 1.8 on 7 mg coumadin daily   Review of Systems  Constitutional: Negative for malaise/fatigue.  Respiratory: Negative for cough and shortness of breath.   Cardiovascular: Negative for chest pain.  Gastrointestinal: Negative for heartburn and constipation.  Musculoskeletal: Negative for myalgias and joint pain.  Skin: Negative.   Neurological: Negative for headaches.  Psychiatric/Behavioral: Negative for depression. The patient does not have insomnia.     Physical Exam  Constitutional: She appears well-developed and well-nourished.  Neck: Neck  supple. No thyromegaly present.  Cardiovascular: Normal rate, regular rhythm and intact distal pulses.   Respiratory: Effort normal and breath sounds normal. No respiratory distress.  GI: Soft. Bowel sounds are normal. She exhibits no distension. There is no tenderness.  Musculoskeletal: She exhibits no edema.  Able to move all extremities  Neurological: She is alert.  Skin: Skin is warm and dry.  Psychiatric: She has a normal mood and affect.      ASSESSMENT/ PLAN:  Pulmonary embolus Will continue coumadin therapy and will continue to monitor her status   Long term (current) use of anticoagulants For inr of 2.3 will continue coumadin 11 mg daily and will check inr in 2 weeks.   Essential hypertension, benign Is stable will continue lisinopril 10 mg daily   Unspecified hypothyroidism Is stable will continue synthroid 75 mcg daily   Depression Is stable will continue effexor xr 75 mg daily; ativan 1 mg in the am and every 6 hours as needed;will continue trilafon 2 mg nightly to help stabilize mood.  is on tegretol 200 mg twice daily without known diagnosis of seizure disorder; will lower her tegretol to 100 mg twice daily and will monitor her status; mor than likely this was ordered to help stabilize mood.   Dementia, multi-infarct Is without change will continue namenda 10 mg twice daily and will continue to monitor her status   GERD (gastroesophageal reflux disease) Will continue pepcid 20 mg daily   Hyposmolality and/or hyponatremia Is stable will continue sodium chloride 1 gm twice daily and will continue to monitor her status   Unspecified constipation Will continue miralax daily will continue colace twice daily   Osteoarthritis Is stable will continue tylenol arthritis twice daily and vicodin 5/325 mg every 4 hours as needed   Edema Is stable will continue lasix 20 mg daily with k+ 20 meq daily and will monitor

## 2012-12-16 ENCOUNTER — Non-Acute Institutional Stay (SKILLED_NURSING_FACILITY): Payer: Medicare Other | Admitting: Adult Health

## 2012-12-16 ENCOUNTER — Encounter: Payer: Self-pay | Admitting: Adult Health

## 2012-12-16 DIAGNOSIS — I2699 Other pulmonary embolism without acute cor pulmonale: Secondary | ICD-10-CM

## 2012-12-16 DIAGNOSIS — Z7901 Long term (current) use of anticoagulants: Secondary | ICD-10-CM

## 2012-12-16 NOTE — Progress Notes (Signed)
Patient ID: Tricia Martin, female   DOB: February 06, 1931, 79 y.o.   MRN: [id number]        PROGRESS NOTE  DATE: 11/26/2012  FACILITY:  [name]  LEVEL OF CARE: SNF (31)  Acute Visit  CHIEF COMPLAINT:  Manage anticoagulation.    HISTORY OF PRESENT ILLNESS: I was requested by the staff to assess the patient regarding above problem(s):  PE:  Patient's PE remains stable.  She denies chest pain, shortness of breath, or palpitations.    ANTICOAGULATION:  On 11/26/2012, patient's INR is 2.  On 11/23/2012, INR was 1.6 and Coumadin was increased to 11 mg.  She is on Coumadin and denies any bleeding.   PAST MEDICAL HISTORY : Reviewed.  No changes.  CURRENT MEDICATIONS: Reviewed per [facility]  PHYSICAL EXAMINATION  GENERAL: no acute distress, moderately obese body habitus RESPIRATORY: breathing is even & unlabored, BS CTAB CARDIAC: RRR, no murmur,no extra heart sounds, no edema  ASSESSMENT/PLAN:  PE.  Stable.   V58.61.  INR is therapeutic and stable.  Continue Coumadin at 11 mg q.d. and check INR on 11/30/2012.    CPT CODE: 40981

## 2012-12-16 NOTE — Progress Notes (Signed)
Patient ID: Tricia Martin, female   DOB: September 11, 1930, 79 y.o.   MRN: [id number]  [facility]  Allergies  Allergen Reactions  . Ciprofloxacin Hcl   . Codeine   . Duloxetine Hcl   . Hctz (Hydrochlorothiazide)   . Metaxalone   . Morphine And Related   . Penicillins   . Quetiapine Fumarate   . Zofran (Ondansetron Hcl)      Chief Complaint  Patient presents with  . Acute Visit    anticoagulation management     HPI:  She is being for her anticoagulation management. Her inr has been difficult to manage; she will be changed to xarelto therapy. There are no other concerns being voiced by the nursing staff at this time.    No past medical history on file.  No past surgical history on file.  VITAL SIGNS BP 110/60  Pulse 68  Ht 5\' 6"  (1.676 m)  Wt 175 lb 3.2 oz (79.47 kg)  BMI 28.29 kg/m2   Patient's Medications  New Prescriptions   No medications on file  Previous Medications   ACETAMINOPHEN (TYLENOL) 650 MG CR TABLET    Take 650 mg by mouth 2 (two) times daily.   CARBAMAZEPINE (TEGRETOL) 200 MG TABLET    Take 200 mg by mouth 2 (two) times daily.   DIPHENOXYLATE-ATROPINE (LOMOTIL) 2.5-0.025 MG PER TABLET    Take 1 tablet by mouth 4 (four) times daily as needed for diarrhea or loose stools.   DOCUSATE SODIUM (COLACE) 100 MG CAPSULE    Take 100 mg by mouth 2 (two) times daily.   FAMOTIDINE (PEPCID) 20 MG TABLET    Take 20 mg by mouth at bedtime.   FISH OIL-OMEGA-3 FATTY ACIDS 1000 MG CAPSULE    Take 1 g by mouth 2 (two) times daily.    FUROSEMIDE (LASIX) 20 MG TABLET    Take 20 mg by mouth daily.   HYDROCODONE-ACETAMINOPHEN (NORCO/VICODIN) 5-325 MG PER TABLET    Take 1 tablet by mouth every 4 (four) hours as needed for pain.   LEVOTHYROXINE (SYNTHROID, LEVOTHROID) 75 MCG TABLET    Take 75 mcg by mouth daily.   LISINOPRIL (PRINIVIL,ZESTRIL) 10 MG TABLET    Take 10 mg by mouth daily.   LORAZEPAM (ATIVAN) 1 MG TABLET    Take 1 tablet (1 mg total) by mouth every 6 (six) hours as  needed for anxiety.   MEMANTINE (NAMENDA) 10 MG TABLET    Take 10 mg by mouth 2 (two) times daily.   MULTIPLE VITAMIN (MULTIVITAMIN WITH MINERALS) TABS    Take 1 tablet by mouth daily.   PERPHENAZINE (TRILAFON) 2 MG TABLET    Take 2 mg by mouth at bedtime.   POLYETHYLENE GLYCOL (MIRALAX / GLYCOLAX) PACKET    Take 17 g by mouth daily.   POTASSIUM CHLORIDE SA (K-DUR,KLOR-CON) 20 MEQ TABLET    Take 20 mEq by mouth daily.   SODIUM CHLORIDE 1 G TABLET    Take 1 g by mouth 2 (two) times daily.   VENLAFAXINE XR (EFFEXOR-XR) 75 MG 24 HR CAPSULE    Take 75 mg by mouth daily.  Modified Medications   Modified Medication Previous Medication   LORAZEPAM (ATIVAN) 0.5 MG TABLET LORazepam (ATIVAN) 0.5 MG tablet      Take 1 mg by mouth daily.    Take 1/2 tablet by mouth every morning   WARFARIN (COUMADIN) 1 MG TABLET warfarin (COUMADIN) 1 MG tablet      Take 11 mg by mouth  daily.    Take 7.5 tablets (7.5 mg total) by mouth daily.  Discontinued Medications   No medications on file    SIGNIFICANT DIAGNOSTIC EXAMS    09-07-12: wbc 4.9; hgb 13.5; hct 38.8; mcv 89.2; plt 210; tsh 1.648; tegretol 4.1 09-21-12: wbc 4.8; hgb 12.6; hct 36.8; mcv 887. plt 271; glucose 106; bun 17; creat 0.85; k+ 3.9 Na++ 135; liver normal albumin 3.3  09-24-12: wbc 6.3; hgb 8.9; hct 27.6 ;mcv 84.1 plt 232; glucose 89; bun 36; creat 1.73; k+ 3.7; na++ 143 Alk phos 68; alt <8 ast 11; albumin 3.2  10-12-12: inr: 1.8 on 7 mg coumadin daily   Review of Systems  Constitutional: Negative for malaise/fatigue.  Respiratory: Negative for cough and shortness of breath.   Cardiovascular: Negative for chest pain.  Gastrointestinal: Negative for heartburn and constipation.  Musculoskeletal: Negative for myalgias and joint pain.  Skin: Negative.   Neurological: Negative for headaches.  Psychiatric/Behavioral: Negative for depression. The patient does not have insomnia.     Physical Exam  Constitutional: She appears well-developed and  well-nourished.  Neck: Neck supple. No thyromegaly present.  Cardiovascular: Normal rate, regular rhythm and intact distal pulses.   Respiratory: Effort normal and breath sounds normal. No respiratory distress.  GI: Soft. Bowel sounds are normal. She exhibits no distension. There is no tenderness.  Musculoskeletal: She exhibits no edema.  Able to move all extremities  Neurological: She is alert.  Skin: Skin is warm and dry.  Psychiatric: She has a normal mood and affect.      ASSESSMENT/ PLAN:  Pulmonary embolus Will change her coumadin therapy to xarelto therapy and will monitor her status   Long term (current) use of anticoagulants For her inr of 1.2 due to the difficulty of managing her inr will change her to xarelto 15 mg twice daily for 3 weeks then 20 mg dialy and will monitor her status

## 2012-12-29 ENCOUNTER — Non-Acute Institutional Stay (SKILLED_NURSING_FACILITY): Payer: Medicare Other | Admitting: Internal Medicine

## 2012-12-29 DIAGNOSIS — I10 Essential (primary) hypertension: Secondary | ICD-10-CM

## 2012-12-29 DIAGNOSIS — R569 Unspecified convulsions: Secondary | ICD-10-CM

## 2012-12-29 DIAGNOSIS — K5900 Constipation, unspecified: Secondary | ICD-10-CM

## 2012-12-29 DIAGNOSIS — E039 Hypothyroidism, unspecified: Secondary | ICD-10-CM

## 2012-12-29 NOTE — Progress Notes (Signed)
Patient ID: Tricia Martin, female   DOB: 12-05-1930, 79 y.o.   MRN: [id number]        PROGRESS NOTE  DATE:  12/29/2012   FACILITY: Nursing Home Location: [facility]  LEVEL OF CARE: SNF (31)  Routine Visit  CHIEF COMPLAINT:  Manage hypothyroidism and seizure disorder.    HISTORY OF PRESENT ILLNESS:  REASSESSMENT OF ONGOING PROBLEM(S):  HYPOTHYROIDISM: The hypothyroidism remains stable. No complications noted from the medications presently being used.  The patient denies fatigue or constipation.  Last TSH 1.648 in 08/2012.    SEIZURE DISORDER: The patient's seizure disorder remains stable. No complications reported from the medications presently being used. Staff do not report any recent seizure activity.   PAST MEDICAL HISTORY : Reviewed.  No changes.  CURRENT MEDICATIONS: Reviewed per [facility]  REVIEW OF SYSTEMS:  GENERAL: no change in appetite, no fatigue, no weight changes, no fever, chills or weakness RESPIRATORY: no cough, SOB, DOE, wheezing, hemoptysis CARDIAC: no chest pain, edema or palpitations GI: no abdominal pain, diarrhea, constipation, heart burn, nausea or vomiting  PHYSICAL EXAMINATION  VS:  T 97.8.     P 78     RR 20    BP 130/76    POX %     WT (Lb) 169.6  GENERAL: no acute distress, normal body habitus NECK: supple, trachea midline, no neck masses, no thyroid tenderness, no thyromegaly RESPIRATORY: breathing is even & unlabored, BS CTAB CARDIAC: RRR, no murmur,no extra heart sounds, no edema GI: abdomen soft, normal BS, no masses, no tenderness, no hepatomegaly, no splenomegaly PSYCHIATRIC: the patient is alert & oriented to person, affect & behavior appropriate  LABS/RADIOLOGY: 09/2012:  Glucose 106, total protein 5.7, albumin 3.3, otherwise CMP normal.    CBC normal.  08/2012:  Tegretol level 4.1.  ASSESSMENT/PLAN:  Seizure disorder.  Well controlled. Tegretol was discontinued.   Hypothyroidism.  Well controlled.      Hypertension.  Well controlled.   Constipation.  Patient denies ongoing symptoms.   Depression.  Continue current medications.   GERD.  Well controlled.    Dementia.  Stable.   CPT CODE: 47829

## 2013-01-26 ENCOUNTER — Non-Acute Institutional Stay (SKILLED_NURSING_FACILITY): Payer: Medicare Other | Admitting: Internal Medicine

## 2013-01-26 DIAGNOSIS — E039 Hypothyroidism, unspecified: Secondary | ICD-10-CM

## 2013-01-26 DIAGNOSIS — K219 Gastro-esophageal reflux disease without esophagitis: Secondary | ICD-10-CM

## 2013-01-26 DIAGNOSIS — R569 Unspecified convulsions: Secondary | ICD-10-CM

## 2013-01-26 DIAGNOSIS — I10 Essential (primary) hypertension: Secondary | ICD-10-CM

## 2013-01-28 NOTE — Progress Notes (Signed)
Patient ID: Tricia Martin, female   DOB: 02-01-1931, 79 y.o.   MRN: [id number]        PROGRESS NOTE  DATE:  01/26/2013   FACILITY: Nursing Home Location: [facility]  LEVEL OF CARE: SNF (31)  Routine Visit  CHIEF COMPLAINT:  Manage hypothyroidism and seizure disorder.    HISTORY OF PRESENT ILLNESS:  REASSESSMENT OF ONGOING PROBLEM(S):  HYPOTHYROIDISM: The hypothyroidism remains stable. No complications noted from the medications presently being used.  The patient denies fatigue or constipation.  Last TSH 1.648 in 08/2012.    SEIZURE DISORDER: The patient's seizure disorder remains stable. Staff do not report any recent seizure activity. Tegretol was discontinued.  PAST MEDICAL HISTORY : Reviewed.  No changes.  CURRENT MEDICATIONS: Reviewed per [facility]  REVIEW OF SYSTEMS:  GENERAL: no change in appetite, no fatigue, no weight changes, no fever, chills or weakness RESPIRATORY: no cough, SOB, DOE, wheezing, hemoptysis CARDIAC: no chest pain, edema or palpitations GI: no abdominal pain, diarrhea, constipation, heart burn, nausea or vomiting  PHYSICAL EXAMINATION  VS:  T 98.     P 62     RR 18    BP 112/76    POX %     WT (Lb) 170.6  GENERAL: no acute distress, normal body habitus NECK: supple, trachea midline, no neck masses, no thyroid tenderness, no thyromegaly RESPIRATORY: breathing is even & unlabored, BS CTAB CARDIAC: RRR, no murmur,no extra heart sounds, no edema GI: abdomen soft, normal BS, no masses, no tenderness, no hepatomegaly, no splenomegaly PSYCHIATRIC: the patient is alert & oriented to person, affect & behavior appropriate  LABS/RADIOLOGY:  09/2012:  Glucose 106, total protein 5.7, albumin 3.3, otherwise CMP normal.    CBC normal.  08/2012:  Tegretol level 4.1.  ASSESSMENT/PLAN:  Seizure disorder.  Stable off tegretol.  Hypothyroidism.  Well controlled.     Hypertension.  Well controlled.   Constipation.  Patient denies  ongoing symptoms.   Depression.  Continue current medications.   GERD.  Well controlled.    Dementia.  Stable.   CPT CODE: [id number]

## 2013-02-02 ENCOUNTER — Non-Acute Institutional Stay (SKILLED_NURSING_FACILITY): Payer: Medicare Other | Admitting: Internal Medicine

## 2013-02-02 DIAGNOSIS — F411 Generalized anxiety disorder: Secondary | ICD-10-CM

## 2013-02-02 DIAGNOSIS — R5383 Other fatigue: Secondary | ICD-10-CM

## 2013-02-02 DIAGNOSIS — F32A Depression, unspecified: Secondary | ICD-10-CM

## 2013-02-02 DIAGNOSIS — F329 Major depressive disorder, single episode, unspecified: Secondary | ICD-10-CM

## 2013-02-02 DIAGNOSIS — F3289 Other specified depressive episodes: Secondary | ICD-10-CM

## 2013-02-02 DIAGNOSIS — R5381 Other malaise: Secondary | ICD-10-CM

## 2013-02-03 ENCOUNTER — Other Ambulatory Visit: Payer: Self-pay | Admitting: Geriatric Medicine

## 2013-02-03 MED ORDER — LORAZEPAM 0.5 MG PO TABS: ORAL_TABLET | ORAL | Status: DC

## 2013-02-04 DIAGNOSIS — M1990 Unspecified osteoarthritis, unspecified site: Secondary | ICD-10-CM | POA: Insufficient documentation

## 2013-02-04 DIAGNOSIS — R609 Edema, unspecified: Secondary | ICD-10-CM | POA: Insufficient documentation

## 2013-02-04 HISTORY — DX: Unspecified osteoarthritis, unspecified site: M19.90

## 2013-02-04 NOTE — Assessment & Plan Note (Signed)
Is stable will continue synthroid 75 mcg daily

## 2013-02-04 NOTE — Assessment & Plan Note (Signed)
Is stable will continue tylenol arthritis twice daily and vicodin 5/325 mg every 4 hours as needed

## 2013-02-04 NOTE — Assessment & Plan Note (Signed)
Is stable will continue lisinopril 10 mg daily

## 2013-02-04 NOTE — Assessment & Plan Note (Signed)
Is stable will continue lasix 20 mg daily with k+ 20 meq daily and will monitor

## 2013-02-04 NOTE — Assessment & Plan Note (Signed)
Is stable will continue effexor xr 75 mg daily; ativan 1 mg in the am and every 6 hours as needed;will continue trilafon 2 mg nightly to help stabilize mood.  is on tegretol 200 mg twice daily without known diagnosis of seizure disorder; will lower her tegretol to 100 mg twice daily and will monitor her status; mor than likely this was ordered to help stabilize mood.

## 2013-02-04 NOTE — Assessment & Plan Note (Signed)
Will continue miralax daily will continue colace twice daily

## 2013-02-04 NOTE — Assessment & Plan Note (Signed)
Is stable will continue sodium chloride 1 gm twice daily and will continue to monitor her status

## 2013-02-04 NOTE — Assessment & Plan Note (Addendum)
Will change her coumadin therapy to xarelto therapy and will monitor her status

## 2013-02-04 NOTE — Assessment & Plan Note (Signed)
For her inr of 1.2 due to the difficulty of managing her inr will change her to xarelto 15 mg twice daily for 3 weeks then 20 mg dialy and will monitor her status

## 2013-02-04 NOTE — Assessment & Plan Note (Signed)
For inr of 2.3 will continue coumadin 11 mg daily and will check inr in 2 weeks.

## 2013-02-04 NOTE — Assessment & Plan Note (Signed)
Is without change will continue namenda 10 mg twice daily and will continue to monitor her status

## 2013-02-04 NOTE — Assessment & Plan Note (Signed)
Will continue pepcid 20 mg daily  

## 2013-02-04 NOTE — Assessment & Plan Note (Signed)
Will continue coumadin therapy and will continue to monitor her status

## 2013-02-25 DIAGNOSIS — R5383 Other fatigue: Secondary | ICD-10-CM | POA: Insufficient documentation

## 2013-02-25 DIAGNOSIS — F411 Generalized anxiety disorder: Secondary | ICD-10-CM

## 2013-02-25 HISTORY — DX: Generalized anxiety disorder: F41.1

## 2013-02-25 NOTE — Progress Notes (Signed)
Patient ID: Tricia Martin, female   DOB: 26-Feb-1931, 79 y.o.   MRN: [id number]        PROGRESS NOTE  DATE: 02/02/2013  FACILITY:  [name]  LEVEL OF CARE: SNF (31)  Acute Visit  CHIEF COMPLAINT:  Manage lethargy, anxiety, and depression.    HISTORY OF PRESENT ILLNESS: I was requested by the staff to assess the patient regarding above problem(s):  LETHARGY:  Patient is complaining of feeling sleepy all the time and not wanting to get up.  She cannot identify precipitating or alleviating factors.  There are no other associated signs and symptoms.  Symptoms are constant.  DEPRESSION: Pharmacy consultant recommends a dose reduction of her Effexor.  The depression remains stable. Patient denies ongoing feelings of sadness, insomnia, anedhonia or lack of appetite. No complications reported from the medications currently being used. Staff do not report behavioral problems.   ANXIETY: Pharmacy consultant recommends a dose reduction of her Ativan.  The anxiety remains stable. Patient denies ongoing anxiety or irritability. No complications reported from the medications currently being used.  PAST MEDICAL HISTORY : Reviewed.  No changes.  CURRENT MEDICATIONS: Reviewed per [facility]  REVIEW OF SYSTEMS:  GENERAL: no change in appetite, no fatigue, no weight changes, no fever, chills or weakness RESPIRATORY: no cough, SOB, DOE,, wheezing, hemoptysis CARDIAC: no chest pain, edema or palpitations GI: no abdominal pain, diarrhea, constipation, heart burn, nausea or vomiting  PHYSICAL EXAMINATION  GENERAL: no acute distress, moderately obese body habitus EYES: conjunctivae normal, sclerae normal, normal eye lids NECK: supple, trachea midline, no neck masses, no thyroid tenderness, no thyromegaly LYMPHATICS: no LAN in the neck, no supraclavicular LAN RESPIRATORY: breathing is even & unlabored, BS CTAB CARDIAC: RRR, no murmur,no extra heart sounds, no edema GI: abdomen  soft, normal BS, no masses, no tenderness, no hepatomegaly, no splenomegaly PSYCHIATRIC: the patient is alert & oriented to person, decreased affect and mood, lethargic  ASSESSMENT/PLAN:  Lethargy.  Ongoing problem.  Likely from psychiatric medications.  We will taper off perphenazine.  Also see #2 and #3 discussions below.    Depression.  Stable.  Decrease Effexor XR to 150 mg q.d.   Anxiety.  Stable.  Decrease Ativan to 0.25 mg q.a.m.   CPT CODE: 40981

## 2013-03-31 ENCOUNTER — Non-Acute Institutional Stay (SKILLED_NURSING_FACILITY): Payer: Medicare Other | Admitting: Internal Medicine

## 2013-03-31 DIAGNOSIS — I10 Essential (primary) hypertension: Secondary | ICD-10-CM

## 2013-03-31 DIAGNOSIS — K5900 Constipation, unspecified: Secondary | ICD-10-CM

## 2013-03-31 DIAGNOSIS — E039 Hypothyroidism, unspecified: Secondary | ICD-10-CM

## 2013-03-31 DIAGNOSIS — R569 Unspecified convulsions: Secondary | ICD-10-CM

## 2013-03-31 NOTE — Progress Notes (Signed)
Patient ID: Tricia Martin, female   DOB: 1931-01-18, 79 y.o.   MRN: [id number]        PROGRESS NOTE  DATE:  03/31/2013   FACILITY: Nursing Home Location: [facility]  LEVEL OF CARE: SNF (31)  Routine Visit  CHIEF COMPLAINT:  Manage hypothyroidism and seizure disorder.    HISTORY OF PRESENT ILLNESS:  REASSESSMENT OF ONGOING PROBLEM(S):  HYPOTHYROIDISM: The hypothyroidism remains stable. No complications noted from the medications presently being used.  The patient denies fatigue or constipation.  Last TSH 1.648 in 08/2012.    SEIZURE DISORDER: The patient's seizure disorder remains stable. Staff do not report any recent seizure activity. Tegretol was discontinued.  PAST MEDICAL HISTORY : Reviewed.  No changes.  CURRENT MEDICATIONS: Reviewed per [facility]  REVIEW OF SYSTEMS:  GENERAL: no change in appetite, no fatigue, no weight changes, no fever, chills or weakness RESPIRATORY: no cough, SOB, DOE, wheezing, hemoptysis CARDIAC: no chest pain, edema or palpitations GI: no abdominal pain, diarrhea, constipation, heart burn, nausea or vomiting  PHYSICAL EXAMINATION  VS:  T 98.     P 80     RR 18    BP 122/72    POX %     WT (Lb) 170.  GENERAL: no acute distress, normal body habitus NECK: supple, trachea midline, no neck masses, no thyroid tenderness, no thyromegaly RESPIRATORY: breathing is even & unlabored, BS CTAB CARDIAC: RRR, no murmur,no extra heart sounds, no edema GI: abdomen soft, normal BS, no masses, no tenderness, no hepatomegaly, no splenomegaly PSYCHIATRIC: the patient is alert & oriented to person, affect & behavior appropriate  LABS/RADIOLOGY:  6-14 Tegretol level less than 0.3  09/2012:  Glucose 106, total protein 5.7, albumin 3.3, otherwise CMP normal.    CBC normal.  08/2012:  Tegretol level 4.1.  ASSESSMENT/PLAN:  Seizure disorder.  Stable off tegretol.  Hypothyroidism.  Well controlled. Check TSH    Hypertension.  Well  controlled.   Constipation.  Patient denies ongoing symptoms.   Depression.  Continue current medications.   GERD.  Well controlled.    Dementia.  Stable.   Check CBC and CMP  CPT CODE: 08657

## 2013-05-10 ENCOUNTER — Non-Acute Institutional Stay (SKILLED_NURSING_FACILITY): Payer: Medicare Other | Admitting: Internal Medicine

## 2013-05-10 DIAGNOSIS — K5900 Constipation, unspecified: Secondary | ICD-10-CM

## 2013-05-10 DIAGNOSIS — E039 Hypothyroidism, unspecified: Secondary | ICD-10-CM

## 2013-05-10 DIAGNOSIS — R569 Unspecified convulsions: Secondary | ICD-10-CM

## 2013-05-10 DIAGNOSIS — I10 Essential (primary) hypertension: Secondary | ICD-10-CM

## 2013-05-10 NOTE — Progress Notes (Signed)
Patient ID: Tricia Martin, female   DOB: 02/07/1931, 79 y.o.   MRN: [id number]        PROGRESS NOTE  DATE:  05/10/2013   FACILITY: Nursing Home Location: [facility]  LEVEL OF CARE: SNF (31)  Routine Visit  CHIEF COMPLAINT:  Manage hypothyroidism and seizure disorder.    HISTORY OF PRESENT ILLNESS:  REASSESSMENT OF ONGOING PROBLEM(S):  HYPOTHYROIDISM: The hypothyroidism remains stable. No complications noted from the medications presently being used.  The patient denies fatigue or constipation.  Last TSH 1.648 in 08/2012.  In 9-14 TSH 1.003.    SEIZURE DISORDER: The patient's seizure disorder remains stable. Staff do not report any recent seizure activity. Tegretol was discontinued.  PAST MEDICAL HISTORY : Reviewed.  No changes.  CURRENT MEDICATIONS: Reviewed per [facility]  REVIEW OF SYSTEMS:  GENERAL: no change in appetite, no fatigue, no weight changes, no fever, chills or weakness RESPIRATORY: no cough, SOB, DOE, wheezing, hemoptysis CARDIAC: no chest pain, edema or palpitations GI: no abdominal pain, diarrhea, constipation, heart burn, nausea or vomiting  PHYSICAL EXAMINATION  VS:  T 97.1.     P 66     RR 18    BP 118/78    POX %     WT (Lb) 167.8  GENERAL: no acute distress, normal body habitus NECK: supple, trachea midline, no neck masses, no thyroid tenderness, no thyromegaly RESPIRATORY: breathing is even & unlabored, BS CTAB CARDIAC: RRR, no murmur,no extra heart sounds, no edema GI: abdomen soft, normal BS, no masses, no tenderness, no hepatomegaly, no splenomegaly PSYCHIATRIC: the patient is alert & oriented to person, affect & behavior appropriate  LABS/RADIOLOGY:  9-14 cbc nl, bun 25, tp 5.5, alb 3.4 ow cmp nl  6-14 Tegretol level less than 0.3  09/2012:  Glucose 106, total protein 5.7, albumin 3.3, otherwise CMP normal.    CBC normal.  08/2012:  Tegretol level 4.1.  ASSESSMENT/PLAN:  Seizure disorder.  Stable off  tegretol.  Hypothyroidism.  Well controlled.   Hypertension.  Well controlled.   Constipation.  Patient denies ongoing symptoms.   Depression.  Continue current medications.   GERD.  Well controlled.    Dementia.  Stable.   CPT CODE: 40981

## 2013-06-08 ENCOUNTER — Other Ambulatory Visit: Payer: Self-pay | Admitting: *Deleted

## 2013-06-08 MED ORDER — LORAZEPAM 1 MG PO TABS: ORAL_TABLET | ORAL | Status: DC

## 2013-06-14 ENCOUNTER — Non-Acute Institutional Stay (SKILLED_NURSING_FACILITY): Payer: Medicare Other | Admitting: Internal Medicine

## 2013-06-14 ENCOUNTER — Encounter: Payer: Self-pay | Admitting: Internal Medicine

## 2013-06-14 DIAGNOSIS — E039 Hypothyroidism, unspecified: Secondary | ICD-10-CM

## 2013-06-14 DIAGNOSIS — K5900 Constipation, unspecified: Secondary | ICD-10-CM

## 2013-06-14 DIAGNOSIS — F329 Major depressive disorder, single episode, unspecified: Secondary | ICD-10-CM

## 2013-06-14 DIAGNOSIS — I10 Essential (primary) hypertension: Secondary | ICD-10-CM

## 2013-06-14 NOTE — Progress Notes (Signed)
Patient ID: Tricia Martin, female   DOB: 16-Dec-1930, 79 y.o.   MRN: [id number]        PROGRESS NOTE  DATE:  06/14/2013   FACILITY: Nursing Home Location: [facility]  LEVEL OF CARE: SNF (31)  Routine Visit  CHIEF COMPLAINT:  Manage hypothyroidism and hypertension.    HISTORY OF PRESENT ILLNESS:  REASSESSMENT OF ONGOING PROBLEM(S):  HYPOTHYROIDISM: The hypothyroidism remains stable. No complications noted from the medications presently being used.  The patient denies fatigue or constipation.  Last TSH 1.648 in 08/2012.  In 9-14 TSH 1.003.    HTN: Pt 's HTN remains stable.  Denies CP, sob, DOE, pedal edema, headaches, dizziness or visual disturbances.  No complications from the medications currently being used.  Last BP : 134/76  PAST MEDICAL HISTORY : Reviewed.  No changes.  CURRENT MEDICATIONS: Reviewed per [facility]  REVIEW OF SYSTEMS:  GENERAL: no change in appetite, no fatigue, no weight changes, no fever, chills or weakness RESPIRATORY: no cough, SOB, DOE, wheezing, hemoptysis CARDIAC: no chest pain, edema or palpitations GI: no abdominal pain, diarrhea, constipation, heart burn, nausea or vomiting  PHYSICAL EXAMINATION  VS:  T 97.1.     P 66     RR 18    BP 134/76    POX %     WT (Lb) 169  GENERAL: no acute distress, normal body habitus NECK: supple, trachea midline, no neck masses, no thyroid tenderness, no thyromegaly RESPIRATORY: breathing is even & unlabored, BS CTAB CARDIAC: RRR, no murmur,no extra heart sounds, no edema GI: abdomen soft, normal BS, no masses, no tenderness, no hepatomegaly, no splenomegaly PSYCHIATRIC: the patient is alert & oriented to person, affect & behavior appropriate  LABS/RADIOLOGY:  9-14 cbc nl, bun 25, tp 5.5, alb 3.4 ow cmp nl  6-14 Tegretol level less than 0.3  09/2012:  Glucose 106, total protein 5.7, albumin 3.3, otherwise CMP normal.    CBC normal.  08/2012:  Tegretol level  4.1.  ASSESSMENT/PLAN:   Hypothyroidism.  Well controlled.   Hypertension.  Well controlled.   Constipation.  Patient denies ongoing symptoms.   Depression.  Continue current medications.   GERD.  Well controlled.    Dementia.  Stable.   CPT CODE: 56213

## 2013-08-09 ENCOUNTER — Non-Acute Institutional Stay (SKILLED_NURSING_FACILITY): Payer: Medicare Other | Admitting: Internal Medicine

## 2013-08-09 DIAGNOSIS — E039 Hypothyroidism, unspecified: Secondary | ICD-10-CM

## 2013-08-09 DIAGNOSIS — K5900 Constipation, unspecified: Secondary | ICD-10-CM

## 2013-08-09 DIAGNOSIS — F329 Major depressive disorder, single episode, unspecified: Secondary | ICD-10-CM

## 2013-08-09 DIAGNOSIS — F32A Depression, unspecified: Secondary | ICD-10-CM

## 2013-08-09 DIAGNOSIS — F3289 Other specified depressive episodes: Secondary | ICD-10-CM

## 2013-08-09 DIAGNOSIS — I10 Essential (primary) hypertension: Secondary | ICD-10-CM

## 2013-08-13 NOTE — Progress Notes (Signed)
Patient ID: Tricia PentonBetty Cuda, female   DOB: 09-10-1930, 80 y.o.   MRN: [id number]         PROGRESS NOTE  DATE:  08/09/2013   FACILITY: Nursing Home Location: [facility]  LEVEL OF CARE: SNF (31)  Routine Visit  CHIEF COMPLAINT:  Manage hypothyroidism and hypertension.    HISTORY OF PRESENT ILLNESS:  REASSESSMENT OF ONGOING PROBLEM(S):  HYPOTHYROIDISM: The hypothyroidism remains stable. No complications noted from the medications presently being used.  The patient denies fatigue or constipation.  Last TSH 1.648 in 08/2012.  In 9-14 TSH 1.003.    HTN: Pt 's HTN remains stable.  Denies CP, sob, DOE, pedal edema, headaches, dizziness or visual disturbances.  No complications from the medications currently being used.  Last BP : 134/76, 102/70  PAST MEDICAL HISTORY : Reviewed.  No changes.  CURRENT MEDICATIONS: [facility]  REVIEW OF SYSTEMS:  GENERAL: no change in appetite, no fatigue, no weight changes, no fever, chills or weakness RESPIRATORY: no cough, SOB, DOE, wheezing, hemoptysis CARDIAC: no chest pain, edema or palpitations GI: no abdominal pain, diarrhea, constipation, heart burn, nausea or vomiting  PHYSICAL EXAMINATION  VS:  T 97.4     P 70     RR 18    BP 102/70    POX %     WT (Lb) 169  GENERAL: no acute distress, normal body habitus NECK: supple, trachea midline, no neck masses, no thyroid tenderness, no thyromegaly RESPIRATORY: breathing is even & unlabored, BS CTAB CARDIAC: RRR, no murmur,no extra heart sounds, no edema GI: abdomen soft, normal BS, no masses, no tenderness, no hepatomegaly, no splenomegaly PSYCHIATRIC: the patient is alert & oriented to person, affect & behavior appropriate  LABS/RADIOLOGY:  9-14 cbc nl, bun 25, tp 5.5, alb 3.4 ow cmp nl  6-14 Tegretol level less than 0.3  09/2012:  Glucose 106, total protein 5.7, albumin 3.3, otherwise CMP normal.    CBC normal.  08/2012:  Tegretol level  4.1.  ASSESSMENT/PLAN:   Hypothyroidism.  Well controlled.   Hypertension.  Well controlled.   Constipation.  Patient denies ongoing symptoms.   Depression.  Continue current medications.   GERD.  Well controlled.    Dementia.  Stable.   CPT CODE: [id number]

## 2013-08-17 ENCOUNTER — Other Ambulatory Visit: Payer: Self-pay | Admitting: *Deleted

## 2013-08-17 MED ORDER — LORAZEPAM 0.5 MG PO TABS: ORAL_TABLET | ORAL | Status: DC

## 2013-09-27 ENCOUNTER — Other Ambulatory Visit: Payer: Self-pay | Admitting: *Deleted

## 2013-09-27 MED ORDER — LORAZEPAM 0.5 MG PO TABS: ORAL_TABLET | ORAL | Status: DC

## 2013-09-27 NOTE — Telephone Encounter (Signed)
[facility] 

## 2013-11-10 ENCOUNTER — Encounter: Payer: Self-pay | Admitting: Internal Medicine

## 2013-11-10 ENCOUNTER — Non-Acute Institutional Stay (SKILLED_NURSING_FACILITY): Payer: Medicare Other | Admitting: Internal Medicine

## 2013-11-10 DIAGNOSIS — K219 Gastro-esophageal reflux disease without esophagitis: Secondary | ICD-10-CM

## 2013-11-10 DIAGNOSIS — I2699 Other pulmonary embolism without acute cor pulmonale: Secondary | ICD-10-CM

## 2013-11-10 DIAGNOSIS — I10 Essential (primary) hypertension: Secondary | ICD-10-CM

## 2013-11-10 DIAGNOSIS — F29 Unspecified psychosis not due to a substance or known physiological condition: Secondary | ICD-10-CM

## 2013-11-10 DIAGNOSIS — E039 Hypothyroidism, unspecified: Secondary | ICD-10-CM

## 2013-11-10 DIAGNOSIS — M1990 Unspecified osteoarthritis, unspecified site: Secondary | ICD-10-CM

## 2013-11-10 DIAGNOSIS — R609 Edema, unspecified: Secondary | ICD-10-CM

## 2013-11-10 DIAGNOSIS — F0150 Vascular dementia without behavioral disturbance: Secondary | ICD-10-CM

## 2013-11-10 NOTE — Progress Notes (Signed)
Patient ID: Tricia Martin, female   DOB: 1931/02/10, 80 y.o.   MRN: [id number]                         This is a routine visit.  Level care skilled.  Facility AF                  Chief Complaint   Patient presents with   .  Medical Managment of Chronic Issues        HPI:   She is being seen for the management fo her chronic illnesses; there are no concerns being voiced by th nursing staff at this time. There are no concerns being voiced by the patient as well. She does have a history of pulmonary embolism and was recently switched from Coumadin to Xarelto--apparently she has tolerated this well.  \ She does have a history of multi-infarct dementia and continues on Namenda apparently she is doing relatively well in this setting  Her other medical issues appear to be relatively stable and again nursing staff has not really report any concerns  I do note occasional systolic blood pressures in the 59X I got 774 systolically over 60 tonight-systolics appear to run from the high 90s-to 120s-130's   does not complaining of any dizziness or syncopal type episodes--                    Labs.  04/02/2013.  WBC 3.6 hemoglobin 13.3 platelets 209.  Sodium 138 potassium 4 BUN 25 creatinine 0.98.  Liver function tests within normal limits except albumin of 3.4.  TSH-1.003  Chol 203 HDL 53 LDL 135 triglycerides 74    09-07-12: wbc 4.9; hgb 13.5; hct 38.8; mcv 89.2; plt 210; tsh 1.648; tegretol 4.1 09-21-12: wbc 4.8; hgb 12.6; hct 36.8; mcv 887. plt 271; glucose 106; bun 17; creat 0.85; k+ 3.9 Na++ 135; liver normal albumin 3.3   09-24-12: wbc 6.3; hgb 8.9; hct 27.6 ;mcv 84.1 plt 232; glucose 89; bun 36; creat 1.73; k+ 3.7; na++ 143 Alk phos 68; alt <8 ast 11; albumin 3.2   10-12-12: inr: 1.8 on 7 mg coumadin daily    Review of Systems  Constitutional: Negative for malaise/fatigue.  Respiratory: Negative for cough and shortness of breath.     Cardiovascular: Negative for chest pain.  Gastrointestinal: Negative for heartburn and constipation.  Musculoskeletal: Negative for myalgias and joint pain.  Skin: Negative.for rash or itching   Neurological: Negative for headaches.  Psychiatric/Behavioral: Negative for depression. The patient does not have insomnia.       Physical Exam  T- 97.8 pulse 80 respirations 18 blood pressure 106/60 Constitutional: She appears well-developed and well-nourished. Pleasant lying in bed comfortably  Eyes-pupils appear equal round react to light she has prescription lenses.  Visual acuity appears grossly intact.  Oropharynx clear mucous membranes moist  Cardiovascular: Normal rate, regular rhythm no significant lower extremity edema   Respiratory: Effort normal and breath sounds normal. No respiratory distress.  GI: Soft. Bowel sounds are normal. She exhibits no distension. There is no tenderness. Bowel sounds are positive  Musculoskeletal: She exhibits no edema. --Moves all extremities x4 limited exam since patient is in bed Able to move all extremities  Neurological: She is aler== no lateralizing findings t.  Skin: Skin is warm and dry.  Psychiatric: She has a normal mood and affect. She is oriented to self city state but could not tell me the year knew  it was Apr[place] her. birth date but said it was [birth year] instead of [birth year]          ASSESSMENT/ PLAN:   Pulmonary embolus Continues on Xarelto--has been stable     .    Essential hypertension, benign On lisinopril 10 mg we'll reduce this to 5 mg it appears her systolics at times is somewhat low although apparently she's asymptomatic also check blood pressures 3 times a day with log in provider book for review Also hold Lasix for systolic blood pressure less than 100    Unspecified hypothyroidism Is stable will continue synthroid 75 mcg daily-update TSH    Depression Is stable will continue effexor xr 75 mg daily; and  low-dose Ativan 0.25 mg once a day as needed for anxiety    Dementia, multi-infarct Is without change will continue namenda 10 mg twice daily and will continue to monitor her status--her dementia appears to be moderate mild    GERD (gastroesophageal reflux disease) Will continue pepcid 20 mg daily    Hyposmolality and/or hyponatremia Is stable will continue sodium chloride 1 gm twice daily and will continue to monitor her status   Unspecified constipation Will continue miralax daily will continue colace twice daily    Osteoarthritis Is stable will continue tylenol arthritis twice daily and vicodin 5/325 mg every 4 hours as needed    Edema Is stable will continue lasix 20 mg daily with k+ 20 meq daily and will monitor--.  Question hyperlipidemia-LDL is mildly elevated however  with patient's relatively advanced age and comorbidities would be hesitant to be real aggressive here  [phone]

## 2013-11-16 ENCOUNTER — Non-Acute Institutional Stay (SKILLED_NURSING_FACILITY): Payer: Medicare Other | Admitting: Internal Medicine

## 2013-11-16 DIAGNOSIS — K1370 Unspecified lesions of oral mucosa: Secondary | ICD-10-CM

## 2013-11-16 DIAGNOSIS — K1379 Other lesions of oral mucosa: Secondary | ICD-10-CM

## 2013-11-17 DIAGNOSIS — K1379 Other lesions of oral mucosa: Secondary | ICD-10-CM | POA: Insufficient documentation

## 2013-11-17 NOTE — Progress Notes (Signed)
Patient ID: Tricia PentonBetty Martin, female   DOB: 06/01/1931, 80 y.o.   MRN: [id number]            PROGRESS NOTE  DATE: 11/16/2013     [facility]  LEVEL OF CARE: SNF (31)  Acute Visit  CHIEF COMPLAINT:  Manage mouth pain.    HISTORY OF PRESENT ILLNESS: I was requested by the staff to assess the patient regarding above problem(s):    Staff report that patient is complaining of upper gum pain.  Per patient, her dentures do not fit well.  She denies any lesions.    PAST MEDICAL HISTORY : Reviewed.  No changes/see problem list  CURRENT MEDICATIONS: Reviewed per MAR/see medication list  PHYSICAL EXAMINATION  VS:  T 98.4       P 76      RR 18      BP 110/60       WT (Lb) 156.6       GENERAL: no acute distress, normal body habitus MOUTH/THROAT: lips without lesions,no lesions in the mouth,tongue is without lesions,uvula elevates in midline    PSYCHIATRIC: the patient is alert & oriented to person, affect & behavior appropriate  ASSESSMENT/PLAN:    Mouth pain.  Secondary to ill-fitting dentures.  Request dental consult.    CPT CODE: [id number]       Angela CoxGayani Y Chandelle Harkey, MD [facility] [phone]

## 2013-11-23 ENCOUNTER — Other Ambulatory Visit: Payer: Self-pay | Admitting: *Deleted

## 2013-11-23 MED ORDER — HYDROCODONE-ACETAMINOPHEN 5-325 MG PO TABS: 1.0000 | ORAL_TABLET | ORAL | Status: DC | PRN

## 2013-11-23 NOTE — Telephone Encounter (Signed)
[facility] 

## 2013-12-15 ENCOUNTER — Non-Acute Institutional Stay (SKILLED_NURSING_FACILITY): Payer: Medicare Other | Admitting: Internal Medicine

## 2013-12-15 DIAGNOSIS — I2699 Other pulmonary embolism without acute cor pulmonale: Secondary | ICD-10-CM

## 2013-12-15 DIAGNOSIS — M1990 Unspecified osteoarthritis, unspecified site: Secondary | ICD-10-CM

## 2013-12-15 DIAGNOSIS — K219 Gastro-esophageal reflux disease without esophagitis: Secondary | ICD-10-CM

## 2013-12-15 DIAGNOSIS — I10 Essential (primary) hypertension: Secondary | ICD-10-CM

## 2013-12-15 DIAGNOSIS — R609 Edema, unspecified: Secondary | ICD-10-CM

## 2013-12-15 DIAGNOSIS — F0390 Unspecified dementia without behavioral disturbance: Secondary | ICD-10-CM

## 2013-12-15 DIAGNOSIS — E039 Hypothyroidism, unspecified: Secondary | ICD-10-CM

## 2013-12-15 NOTE — Progress Notes (Signed)
Patient ID: Tricia Martin, female   DOB: 08/20/1930, 80 y.o.   MRN: [id number]   This is a routine visit.  Level care skilled.  Facility [facility]   Chief Complaint  Patient presents with  . Medical Managment of Chronic Issues   HPI:  She is being seen for the management fo her chronic illnesses; there are no concerns being voiced by th nursing staff at this time. There are no concerns being voiced by the patient as well Apparently  yesterday complaining of a sore throat but she says this has gone away  . She does have a history of pulmonary embolism and was recently switched from Coumadin to Xarelto--apparently she has tolerated this well.  \ She does have a history of multi-infarct dementia and continues on Namenda apparently she is doing relatively well in this setting  Her other medical issues appear to be relatively stable and again nursing staff has not really report any concerns  I do note occasional systolic blood pressures in the 90s when I saw her last month however this does not appear to be frequent --did write orders to hold her Lasix for a low systolic but it appears that somehow Lasix has been discontinued nonetheless she appears to be stable in this regard blood pressure on exam today was 100/70 manually recent blood pressure is 111/74-148/78-I see one 88/56 although this does not appear to be the norm and I suspect this was a machine reading  She continues to deny any dizziness or syncopal type episodes or palpitations or chest pain.   Labs  11/04/2013.  Sodium 138 potassium 4.1 BUN 18 creatinine 1.0.  Liver function tests within normal limits.  WBC 5.4 hemoglobin 13.4 platelets 219.  11/04/2013.  Cholesterol 203-HDL53.  LDL 135.    04/02/2013.  WBC 3.6 hemoglobin 13.3 platelets 209.  Sodium 138 potassium 4 BUN 25 creatinine 0.98.  Liver function tests within normal limits except albumin of 3.4.  TSH-1.003  Chol 203 HDL 53 LDL 135 triglycerides 74  09-07-12: wbc  4.9; hgb 13.5; hct 38.8; mcv 89.2; plt 210; tsh 1.648; tegretol 4.1  09-21-12: wbc 4.8; hgb 12.6; hct 36.8; mcv 887. plt 271; glucose 106; bun 17; creat 0.85; k+ 3.9  Na++ 135; liver normal albumin 3.3  09-24-12: wbc 6.3; hgb 8.9; hct 27.6 ;mcv 84.1 plt 232; glucose 89; bun 36; creat 1.73; k+ 3.7; na++ 143  Alk phos 68; alt <8 ast 11; albumin 3.2  10-12-12: inr: 1.8 on 7 mg coumadin daily   Review of Systems  Constitutional: Negative for malaise/fatigue. Throat-negative for sore throat  Respiratory: Negative for cough and shortness of breath.  Cardiovascular: Negative for chest pain.  Gastrointestinal: Negative for heartburn and constipation.  Musculoskeletal: Negative for myalgias and joint pain.  Skin: Negative.for rash or itching  Neurological: Negative for headaches.  Psychiatric/Behavioral: Negative for depression. The patient does not have insomnia .  Physical Exam  Temperature 97.8 pulse 70 blood pressure 100/70 respirations 18.   Constitutional: She appears well-developed and well-nourished. Pleasant sitting in her  Eyes-pupils appear equal round react to light she has prescription lenses.  Visual acuity appears grossly intact.  Oropharynx clear mucous membranes moist Neck-could not appreciate any adenopathy  Cardiovascular: Normal rate, regular rhythm no significant lower extremity edema--has an occasional irregular beat  Respiratory: Effort normal and breath sounds normal. No respiratory distress.  GI: Soft. Bowel sounds are normal. She exhibits no distension. There is no tenderness. Bowel sounds are positive  Musculoskeletal: She exhibits  minmal edema. --Moves all extremities x4   Able to move all extremities -- Neurological: She is alert no lateralizing findings t.  Skin: Skin is warm and dry.  Psychiatric: She has a normal mood and affect. Pleasant and interactive appropriate   ASSESSMENT/ PLAN:  Pulmonary embolus  Continues on Xarelto--has been stable  .  Essential  hypertension, benign  On lisinopril 5 mg a day---this was reduced secondary to low blood pressure readings---appears stable  But will have  to be  monitored...  Lasix has been discontinued--I do note she still is on potassium we'll hold this for now and obtain a BMP to ensure stability--I suspect she may not need potassium since she is not on Lasix   Unspecified hypothyroidism  Is stable will continue synthroid 75 mcg daily-update TSH   Depression  Is stable will continue effexor xr 75 mg daily; and low-dose Ativan 0.25 mg once a day as needed for anxiety   Dementia, multi-infarct  Is without change will continue namenda 10 mg twice daily and will continue to monitor her status--her dementia appears to be moderate mild   GERD (gastroesophageal reflux disease)  Will continue pepcid 20 mg daily   Hyposmolality and/or hyponatremia  Is stable will continue sodium chloride 1 gm twice daily and will continue to monitor her status --will update BMP  Unspecified constipation  Will continue miralax daily will continue colace twice daily   Osteoarthritis  Is stable will continue tylenol arthritis twice daily and vicodin 5/325 mg every 4 hours as needed   Edema  This appears stable despite the Lasix being discontinued-continue to monitor .  Question hyperlipidemia-LDL has been mildly elevated however with patient's relatively advanced age and comorbidities would be hesitant to be real aggressive here   [phone]

## 2013-12-16 ENCOUNTER — Encounter: Payer: Self-pay | Admitting: Internal Medicine

## 2014-02-03 ENCOUNTER — Encounter: Payer: Self-pay | Admitting: Internal Medicine

## 2014-02-03 ENCOUNTER — Non-Acute Institutional Stay (SKILLED_NURSING_FACILITY): Payer: Medicare Other | Admitting: Internal Medicine

## 2014-02-03 DIAGNOSIS — I2699 Other pulmonary embolism without acute cor pulmonale: Secondary | ICD-10-CM

## 2014-02-03 DIAGNOSIS — F329 Major depressive disorder, single episode, unspecified: Secondary | ICD-10-CM

## 2014-02-03 DIAGNOSIS — E039 Hypothyroidism, unspecified: Secondary | ICD-10-CM

## 2014-02-03 DIAGNOSIS — I499 Cardiac arrhythmia, unspecified: Secondary | ICD-10-CM

## 2014-02-03 DIAGNOSIS — F0150 Vascular dementia without behavioral disturbance: Secondary | ICD-10-CM

## 2014-02-03 DIAGNOSIS — R609 Edema, unspecified: Secondary | ICD-10-CM

## 2014-02-03 DIAGNOSIS — F32A Depression, unspecified: Secondary | ICD-10-CM

## 2014-02-03 DIAGNOSIS — F3289 Other specified depressive episodes: Secondary | ICD-10-CM

## 2014-02-03 DIAGNOSIS — I10 Essential (primary) hypertension: Secondary | ICD-10-CM

## 2014-02-03 NOTE — Progress Notes (Signed)
Patient ID: Tricia Martin, female   DOB: 1931-01-28, 80 y.o.   MRN: [id number]    This is a routine visit.  Level care skilled.  Facility [facility]   Chief Complaint  Patient presents with  . Medical Managment of Chronic Issues   HPI:  She is being seen for the management fo her chronic illnesses; --nursing staff has left a note about possibly some increased pedal edema.  Otherwise no acute issues have been noted by nursing staff recently  . She does have a history of pulmonary embolism and was recently switched from Coumadin to Xarelto--apparently she has tolerated this well.  \ She does have a history of multi-infarct dementia and continues on Namenda apparently she is doing relatively well in this setting  Her other medical issues appear to be relatively stable and again nursing staff has not really report any concerns --and according to nursing today of the pedal edema is largely gone it was thought this was more dependent edema I do not really see any significant edema today    She continues to deny any dizziness or syncopal type episodes or palpitations or chest pain-she ambulates about the facility at baseline and spends most of her day out of the room participating in activities which is certainly encouraged .  Labs 12/20/2013.  Sodium 137 potassium 3.9 BUN 17 creatinine 0.8.  12/15/2013-TSH-1.263  11/04/2013.  WBC 5.4 hemoglobin 13.4 platelets 219.    11/04/2013.  Sodium 138 potassium 4.1 BUN 18 creatinine 1.0.  Liver function tests within normal limits.  WBC 5.4 hemoglobin 13.4 platelets 219 .  11/04/2013.  Cholesterol 203-HDL53.  LDL 135 .  04/02/2013.  WBC 3.6 hemoglobin 13.3 platelets 209.  Sodium 138 potassium 4 BUN 25 creatinine 0.98.  Liver function tests within normal limits except albumin of 3.4.  TSH-1.003  Chol 203 HDL 53 LDL 135 triglycerides 74   09-07-12: wbc 4.9; hgb 13.5; hct 38.8; mcv 89.2; plt 210; tsh 1.648; tegretol 4.1  09-21-12: wbc 4.8; hgb  12.6; hct 36.8; mcv 887. plt 271; glucose 106; bun 17; creat 0.85; k+ 3.9  Na++ 135; liver normal albumin 3.3  09-24-12: wbc 6.3; hgb 8.9; hct 27.6 ;mcv 84.1 plt 232; glucose 89; bun 36; creat 1.73; k+ 3.7; na++ 143  Alk phos 68; alt <8 ast 11; albumin 3.2  10-12-12: inr: 1.8 on 7 mg coumadin daily   Review of Systems  Constitutional: Negative for malaise/fatigue.  Throat-negative for sore throat  Respiratory: Negative for cough and shortness of breath.  Cardiovascular: Negative for chest pain.  Gastrointestinal: Negative for heartburn and constipation.  Musculoskeletal: Negative for myalgias and joint pain.  Skin: Negative.for rash or itching  Neurological: Negative for headaches.  Psychiatric/Behavioral: Negative for depression. The patient does not have insomnia--she continues to be pleasant and engaged  .  Physical Exam  . T- 97.9 pulse is 68 respirations 18 blood pressure--130/80 this was taken manually today previous blood pressures 138/79-115/78 I see one listed 89/62 although this appears to be quite abnormal Constitutional: She appears well-developed and well-nourished. Pleasant   Eyes-pupils appear equal round react to light she has prescription lenses.  Visual acuity appears grossly intact.  Oropharynx clear mucous membranes moist  Neck-could not appreciate any adenopathy  Cardiovascular: Largely regular rate and rhythm but has a skipped beat it appears about every fifth beat t  Respiratory: Effort normal and breath sounds normal. No respiratory distress.  GI: Soft. Bowel sounds are normal. She exhibits no distension. There is no tenderness.  Bowel sounds are positive  Musculoskeletal: She exhibits minmal edema--I do not really see much pedal edema at all. --Moves all extremities x4  Able to move all extremities --  Neurological: She is alert no lateralizing findings t.  Skin: Skin is warm and dry.  Psychiatric: She has a normal mood and affect. Pleasant and interactive  appropriate   ASSESSMENT/ PLAN :  Pulmonary embolus  Continues on Xarelto--has been stable  .  Essential hypertension, benign  On lisinopril 5 mg a day---this was reduced secondary to low blood pressure readings---appears stable But will have to be monitored...   Edema- at this point does not appear to be an issue she had been on low-dose Lasix this was discontinued secondary to low blood pressures nonetheless this appears to be stable  Unspecified hypothyroidism  Is stable will continue synthroid 75 mcg daily  Depression  Is stable will continue effexor xr 75 mg daily; and low-dose Ativan 0.25 mg once a day as needed for anxiety    Dementia, multi-infarct  Is without change will continue namenda 10 mg twice daily and will continue to monitor her status--her dementia appears to be moderate mild   GERD (gastroesophageal reflux disease)  Will continue pepcid 20 mg daily   Hyposmolality and/or hyponatremia  Is stable will continue sodium chloride 1 gm twice daily and will continue to monitor her status --will update BMP   Unspecified constipation  Will continue miralax daily will continue colace twice daily   Osteoarthritis  Is stable will continue tylenol arthritis twice daily and vicodin 5/325 mg every 4 hours as needed    .  Question hyperlipidemia-LDL has been mildly elevated however with patient's relatively advanced age and comorbidities would be hesitant to be real aggressive here--we'll update a lipid panel  Question arrhythmia-she has some occasional skipped beats certainly does not appear to be symptomatic-will order an EKG to give [place] a little more insight   [phone]

## 2014-03-14 ENCOUNTER — Encounter: Payer: Self-pay | Admitting: Internal Medicine

## 2014-03-14 ENCOUNTER — Non-Acute Institutional Stay (SKILLED_NURSING_FACILITY): Payer: Medicare Other | Admitting: Internal Medicine

## 2014-03-14 DIAGNOSIS — I2699 Other pulmonary embolism without acute cor pulmonale: Secondary | ICD-10-CM

## 2014-03-14 DIAGNOSIS — R112 Nausea with vomiting, unspecified: Secondary | ICD-10-CM | POA: Insufficient documentation

## 2014-03-14 DIAGNOSIS — F3289 Other specified depressive episodes: Secondary | ICD-10-CM

## 2014-03-14 DIAGNOSIS — M150 Primary generalized (osteo)arthritis: Secondary | ICD-10-CM

## 2014-03-14 DIAGNOSIS — F32A Depression, unspecified: Secondary | ICD-10-CM

## 2014-03-14 DIAGNOSIS — F0150 Vascular dementia without behavioral disturbance: Secondary | ICD-10-CM

## 2014-03-14 DIAGNOSIS — F329 Major depressive disorder, single episode, unspecified: Secondary | ICD-10-CM

## 2014-03-14 DIAGNOSIS — I10 Essential (primary) hypertension: Secondary | ICD-10-CM

## 2014-03-14 DIAGNOSIS — K219 Gastro-esophageal reflux disease without esophagitis: Secondary | ICD-10-CM

## 2014-03-14 DIAGNOSIS — E039 Hypothyroidism, unspecified: Secondary | ICD-10-CM

## 2014-03-14 DIAGNOSIS — M159 Polyosteoarthritis, unspecified: Secondary | ICD-10-CM

## 2014-03-14 NOTE — Progress Notes (Signed)
Patient ID: Tricia Martin, female   DOB: [birth year], 80 y.o.   MRN: [id number]   This is a routine visit.  Level care skilled.  Facility [facility]   Chief Complaint  Patient presents with  . Medical Managment of Chronic Issues -- history of pulmonary embolism-dementia-hypothyroidism hypertension-acute visit secondary to episode of nausea and vomiting - HPI:   Patient is a pleasant elderly resident has been quite stable for some time.  Apparently earlier today she had an episode of vomiting and complained of a headache-she says she has not vomited this afternoon apparently-she said the headache is gone now.  She denies any chest pain or shortness of breath with this-she just says at the time her stomach felt upset but not overtly painful--she says her stomach continues to feel a bit queasy but not really painful  Her vital signs have been  Regards to other issues   . She does have a history of pulmonary embolism and was recently switched from Coumadin to Xarelto--apparently she has tolerated this well.  _0 She does have a history of multi-infarct dementia and continues on Namenda apparently she is doing relatively well in this setting      She continues to deny any dizziness or syncopal type episodes or palpitations or chest pain-she ambulates about the facility at baseline    .  Labs 02/04/2014.  Sodium 139 potassium 4 BUN 19 creatinine 0.9.  WBC 4.1 hemoglobin 12.1 platelets 212.  12/15/2013.  VXY-8.016.  11/04/2013.  Liver function tests within normal limits except albumin of 3.0   12/20/2013.  Sodium 137 potassium 3.9 BUN 17 creatinine 0.8.  12/15/2013-TSH-1.263  11/04/2013.  WBC 5.4 hemoglobin 13.4 platelets 219.  11/04/2013.  Sodium 138 potassium 4.1 BUN 18 creatinine 1.0.  Liver function tests within normal limits.  WBC 5.4 hemoglobin 13.4 platelets 219  .  11/04/2013.  Cholesterol 203-HDL53.  LDL 135  .  04/02/2013.  WBC 3.6 hemoglobin 13.3 platelets 209.    Sodium 138 potassium 4 BUN 25 creatinine 0.98.  Liver function tests within normal limits except albumin of 3.4.  TSH-1.003  Chol 203 HDL 53 LDL 135 triglycerides 74  09-07-12: wbc 4.9; hgb 13.5; hct 38.8; mcv 89.2; plt 210; tsh 1.648; tegretol 4.1  09-21-12: wbc 4.8; hgb 12.6; hct 36.8; mcv 887. plt 271; glucose 106; bun 17; creat 0.85; k+ 3.9  Na++ 135; liver normal albumin 3.3  09-24-12: wbc 6.3; hgb 8.9; hct 27.6 ;mcv 84.1 plt 232; glucose 89; bun 36; creat 1.73; k+ 3.7; na++ 143  Alk phos 68; alt <8 ast 11; albumin 3.2    Review of Systems  Constitutional: Feels somewhat fatigued-.  Throat-negative for sore throat  Respiratory: Negative for cough and shortness of breath.  Cardiovascular: Negative for chest pain.  Gastrointestinal: Positive for nausea and vomiting earlier today--not overtly complain of abdominal pain however.  Musculoskeletal: Negative for myalgias and joint pain.  Skin: Negative.for rash or itching  Neurological: Negative for headaches currently although she did have a headache earlier today.  Psychiatric/Behavioral: Negative for depression. The patient does not have insomnia--she continues to be pleasant and engaged  .  Physical Exam   Temperature 97.0 pulse 68 respirations 20 blood pressure 142/85-per chart review appears blood pressures run 110/67-147/75-I do not see consistent systolic elevations however Constitutional: She appears well-developed and well-nourished. Pleasant--lying comfortably in bed  skin-is warm and dry  Eyes-pupils appear equal round react to light she has prescription lenses.  Visual acuity appears grossly intact.  Oropharynx clear  mucous membranes moist   Cardiovascular--regular rate and rhythm without murmur gallop or rub-minimal lower extremity edema  Respiratory: Effort normal and breath sounds normal. No respiratory distress.  GI: Soft. Bowel sounds are normal. She exhibits no distension. There is no tenderness. Bowel sounds are  positive  Musculoskeletal: She exhibits minmal edema--I. --Moves all extremities x4  Able to move all extremities --has good grip strength bilaterally-  Neurological: She is alert-- no lateralizing findings--her speech is clear t.  Skin: Skin is warm and dry.  Psychiatric: She has a normal mood and affect. Pleasant and interactive appropriate   ASSESSMENT/ PLAN  Episode of nausea and vomiting earlier today-this may be a virus-however will obtain lab work including a CBC with differential and metabolic panel to assess liver function tests as well as also will order a UA C&S she does have a history of UTIs in the past-also monitor vital signs pulse ox every shift for 72 hours-also will check a TSH  Clear liquid diet x24 hours and titrate up as tolerated :  Pulmonary embolus  Continues on Xarelto--has been stable  .  Essential hypertension, benign  On lisinopril 5 mg a day---this was reduced secondary to low blood pressure readings---appears stable But will have to be monitored.. .  Edema- at this point does not appear to be an issue she had been on low-dose Lasix this was discontinued secondary to low blood pressures nonetheless this appears to be stable   Unspecified hypothyroidism  Is stable will continue synthroid 75 mcg daily--update a TSH   Depression  Is stable will continue effexor xr 75 mg daily; and low-dose Ativan 0.25 mg once a day as needed for anxiety   Dementia, multi-infarct  Is without change will continue namenda 10 mg twice daily and will continue to monitor her status--her dementia appears to be moderate mild   GERD (gastroesophageal reflux disease)  Will continue pepcid 20 mg daily   Hyposmolality and/or hyponatremia  Is stable will continue sodium chloride 1 gm twice daily and will continue to monitor her status --will update BMP   Unspecified constipation  Will continue miralax daily will continue colace twice daily   Osteoarthritis  Is stable will continue  tylenol arthritis twice daily and vicodin 5/325 mg every 4 hours as needed  .  Question hyperlipidemia-LDL has been mildly elevated however with patient's relatively advanced age and comorbidities would be hesitant to be real aggressive here-     [phone]

## 2014-05-10 ENCOUNTER — Non-Acute Institutional Stay (SKILLED_NURSING_FACILITY): Payer: Medicare Other | Admitting: Internal Medicine

## 2014-05-10 DIAGNOSIS — E871 Hypo-osmolality and hyponatremia: Secondary | ICD-10-CM

## 2014-05-10 DIAGNOSIS — I10 Essential (primary) hypertension: Secondary | ICD-10-CM

## 2014-05-10 DIAGNOSIS — F0150 Vascular dementia without behavioral disturbance: Secondary | ICD-10-CM

## 2014-05-10 DIAGNOSIS — R609 Edema, unspecified: Secondary | ICD-10-CM

## 2014-05-10 DIAGNOSIS — E038 Other specified hypothyroidism: Secondary | ICD-10-CM

## 2014-05-10 DIAGNOSIS — R509 Fever, unspecified: Secondary | ICD-10-CM

## 2014-05-10 DIAGNOSIS — I2699 Other pulmonary embolism without acute cor pulmonale: Secondary | ICD-10-CM

## 2014-05-10 DIAGNOSIS — R059 Cough, unspecified: Secondary | ICD-10-CM

## 2014-05-10 DIAGNOSIS — R05 Cough: Secondary | ICD-10-CM

## 2014-05-10 NOTE — Progress Notes (Signed)
Patient ID: Tricia Martin, female   DOB: [birth year], 80 y.o.   MRN: [id number]   This is a routine--acute visit.  Level care skilled.  Facility [facility]   Chief Complaint  Patient presents with  . Medical Managment of Chronic Issues -- history of pulmonary embolism-dementia-hypothyroidism hypertension-acute visit secondary low-grade temperature  -  HPI:  Patient is a pleasant elderly resident has been quite stable for some time.  Apparently yesterday when she was noted to have an intermittent fever as high as 100.4-she said she just didn't feel that well yesterday she says she feels a bit better today.  She has been afebrile vital signs are stable-does not really complain of any pain abdominal Y. or dysuria or shortness of breath or chest congestion-says her appetite hasn't been that great the last couple days  Nursing staff has noted a dry cough   Regards to other issues  . She does have a history of pulmonary embolism and was r switched from Coumadin to Xarelto--apparently she has tolerated this well.  \ She does have a history of multi-infarct dementia and continues on Namenda apparently she is doing relatively well in this setting  She continues to deny any dizziness or syncopal type episodes or palpitations or chest pain-she ambulates about the facility at baseline---  .  Labs 03/14/2014.  WBC 5.4 hemoglobin 12.2 platelets 231.  Sodium 136 potassium 3.8 BUN 17 creatinine 0.68.  Liver function tests within normal limits.    02/04/2014.  Sodium 139 potassium 4 BUN 19 creatinine 0.9.  WBC 4.1 hemoglobin 12.1 platelets 212.  12/15/2013.  QMG-8.676.  11/04/2013.  Liver function tests within normal limits except albumin of 3.0  12/20/2013.  Sodium 137 potassium 3.9 BUN 17 creatinine 0.8.  12/15/2013-TSH-1.263  11/04/2013.  WBC 5.4 hemoglobin 13.4 platelets 219.  11/04/2013.  Sodium 138 potassium 4.1 BUN 18 creatinine 1.0.  Liver function tests within normal limits.  WBC 5.4  hemoglobin 13.4 platelets 219  .  11/04/2013.  Cholesterol 203-HDL53.  LDL 135  .  04/02/2013.  WBC 3.6 hemoglobin 13.3 platelets 209.  Sodium 138 potassium 4 BUN 25 creatinine 0.98.  Liver function tests within normal limits except albumin of 3.4.  TSH-1.003  Chol 203 HDL 53 LDL 135 triglycerides 74  09-07-12: wbc 4.9; hgb 13.5; hct 38.8; mcv 89.2; plt 210; tsh 1.648; tegretol 4.1  09-21-12: wbc 4.8; hgb 12.6; hct 36.8; mcv 887. plt 271; glucose 106; bun 17; creat 0.85; k+ 3.9  Na++ 135; liver normal albumin 3.3  09-24-12: wbc 6.3; hgb 8.9; hct 27.6 ;mcv 84.1 plt 232; glucose 89; bun 36; creat 1.73; k+ 3.7; na++ 143  Alk phos 68; alt <8 ast 11; albumin 3.2   Review of Systems  Constitutional: Feels somewhat fatigued-otherwise no complaints.  Throat-negative for sore throat  Respiratory: Positive  dry cough-negative for shortness of breath.  Cardiovascular: Negative for chest pain.  Gastrointestinal: Not complaining of any abdominal discomfort nausea or vomiting Musculoskeletal: Negative for myalgias and joint pain.  Skin: Negative.for rash or itching  Neurological: Negative for headaches .  Psychiatric/Behavioral: Negative for depression. The patient does not have insomnia--she continues to be pleasant and engaged  .  Physical Exam   Temperature 98.8 pulse 66 respirations 18 blood pressure 112/72-134/76 most recently--O2 saturation has been in the 90s Constitutional: She appears well-developed and well-nourished. Pleasant--lying comfortably in bed  skin-is warm and dry  Eyes-pupils appear equal round react to light .  Visual acuity appears grossly intact.  Oropharynx clear mucous  membranes moist  Cardiovascular--regular rate and rhythm without murmur gallop or rub-minimal lower extremity edema  Respiratory: Effort normal and breath sounds normal with possibly slightly reduced breath sounds on the right. No respiratory distress.  GI: Soft. Bowel sounds are normal. She exhibits no  distension. There is no tenderness. Bowel sounds are positive  GU-could not appreciate any suprapubic tenderness Musculoskeletal: She exhibits minmal edema--I. --Moves all extremities x4  Able to move all extremities -and appears at baseline  Neurological: She is alert-- no lateralizing findings--her speech is clear t.  Skin: Skin is warm and dry.  Psychiatric: She has a normal mood and affect. Pleasant and interactive appropriate   ASSESSMENT/ PLAN   Intermittent low-grade temperature  With cough-have concerns about respiratory issue-Will check a chest x-ray and monitor vital signs pulse ox Q. shift x72 hours-also will start Mucinex 600 mg twice a day for 5 days and monitor this  Also will update blood work including a CBC with differential and basic metabolic panel-.  Also appears that time she does have a history of UTIs we'll order a UA C&S as well :  Pulmonary embolus  Continues on Xarelto--has been stable  .  Essential hypertension, benign  On lisinopril 5 mg a day---this was reduced secondary to low blood pressure readings---appears stable But will have to be monitored..  .  Edema- at this point does not appear to be an issue she had been on low-dose Lasix this was discontinued secondary to low blood pressures nonetheless this appears to be stable   Unspecified hypothyroidism  Is stable will continue synthroid 75 mcg daily--most recent TSH in August was satisfactory at 2.29  Depression  Is stable will continue effexor xr 75 mg daily; and low-dose Ativan 0.25 mg once a day as needed for anxiety   Dementia, multi-infarct  Is without change will continue namenda 10 mg twice daily and will continue to monitor her status--her dementia appears to be moderate mild   GERD (gastroesophageal reflux disease)  Will continue pepcid 20 mg daily   Hyposmolality and/or hyponatremia  Is stable will continue sodium chloride 1 gm twice daily and will continue to monitor her status --will  update BMP   Unspecified constipation  Will continue miralax daily will continue colace twice daily   Osteoarthritis  Is stable will continue tylenol arthritis twice daily and vicodin 5/325 mg every 4 hours as needed  .  Question hyperlipidemia-LDL has been mildly elevated however with patient's relatively advanced age and comorbidities would be hesitant to be real aggressive here-    [phone]

## 2014-06-07 ENCOUNTER — Non-Acute Institutional Stay (SKILLED_NURSING_FACILITY): Payer: Medicare Other | Admitting: Internal Medicine

## 2014-06-07 ENCOUNTER — Encounter: Payer: Self-pay | Admitting: Internal Medicine

## 2014-06-07 DIAGNOSIS — F329 Major depressive disorder, single episode, unspecified: Secondary | ICD-10-CM

## 2014-06-07 DIAGNOSIS — M159 Polyosteoarthritis, unspecified: Secondary | ICD-10-CM

## 2014-06-07 DIAGNOSIS — E038 Other specified hypothyroidism: Secondary | ICD-10-CM

## 2014-06-07 DIAGNOSIS — E871 Hypo-osmolality and hyponatremia: Secondary | ICD-10-CM

## 2014-06-07 DIAGNOSIS — I2699 Other pulmonary embolism without acute cor pulmonale: Secondary | ICD-10-CM

## 2014-06-07 DIAGNOSIS — F0150 Vascular dementia without behavioral disturbance: Secondary | ICD-10-CM

## 2014-06-07 DIAGNOSIS — M150 Primary generalized (osteo)arthritis: Secondary | ICD-10-CM

## 2014-06-07 DIAGNOSIS — F32A Depression, unspecified: Secondary | ICD-10-CM

## 2014-06-07 DIAGNOSIS — I10 Essential (primary) hypertension: Secondary | ICD-10-CM

## 2014-06-07 NOTE — Progress Notes (Signed)
Patient ID: Tricia Martin, female   DOB: [birth year], 80 y.o.   MRN: [id number]    Diagnoses     Other specified fever - Primary    ICD-9-CM: 780.60 ICD-10-CM: R50.9    Cough     ICD-9-CM: 786.2 ICD-10-CM: R05    Pulmonary embolus     ICD-9-CM: 415.19 ICD-10-CM: I26.99    Dementia, multi-infarct, without behavioral disturbance     ICD-9-CM: 290.40 ICD-10-CM: F01.50    Essential hypertension, benign     ICD-9-CM: 401.1 ICD-10-CM: I10    Other specified hypothyroidism     ICD-9-CM: 244.8 ICD-10-CM: E03.8    Hyposmolality and/or hyponatremia     ICD-9-CM: 276.1 ICD-10-CM: E87.1    Edema     ICD-9-CM: 782.3 ICD-10-CM: R60.9       Reason for Visit     Reason for Visit History       Progress Notes      Wille Celeste, PA-C at 05/10/2014 4:14 PM     Status: Signed       Expand All Collapse All   Patient ID: Tricia Martin, female DOB: [birth year], 80 y.o. MRN: [id number]   This is a routine--acute visit.  Level care skilled.  Facility [facility]   Chief Complaint  Patient presents with  . Medical Managment of Chronic Issues -- history of pulmonary embolism-dementia-hypothyroidism hypertension-acute visit secondary low-grade temperature  -  HPI:  Patient is a pleasant elderly resident has been quite stable for some time.  Apparently yesterday when she was noted to have an intermittent fever as high as 100.4-she said she just didn't feel that well yesterday she says she feels a bit better today.  She has been afebrile vital signs are stable-does not really complain of any pain abdominal Y. or dysuria or shortness of breath or chest congestion-says her appetite hasn't been that great the last couple days  Nursing staff has noted a dry cough  Regards to other issues  . She does have a history of pulmonary embolism and was r switched from Coumadin to Xarelto--apparently she has tolerated this well.  \ She  does have a history of multi-infarct dementia and continues on Namenda apparently she is doing relatively well in this setting  She continues to deny any dizziness or syncopal type episodes or palpitations or chest pain-she ambulates about the facility at baseline---  .  Labs 03/14/2014.  WBC 5.4 hemoglobin 12.2 platelets 231.  Sodium 136 potassium 3.8 BUN 17 creatinine 0.68.  Liver function tests within normal limits.   02/04/2014.  Sodium 139 potassium 4 BUN 19 creatinine 0.9.  WBC 4.1 hemoglobin 12.1 platelets 212.  12/15/2013.  LKT-6.256.  11/04/2013.  Liver function tests within normal limits except albumin of 3.0  12/20/2013.  Sodium 137 potassium 3.9 BUN 17 creatinine 0.8.  12/15/2013-TSH-1.263  11/04/2013.  WBC 5.4 hemoglobin 13.4 platelets 219.  11/04/2013.  Sodium 138 potassium 4.1 BUN 18 creatinine 1.0.  Liver function tests within normal limits.  WBC 5.4 hemoglobin 13.4 platelets 219  .  11/04/2013.  Cholesterol 203-HDL53.  LDL 135  .  04/02/2013.  WBC 3.6 hemoglobin 13.3 platelets 209.  Sodium 138 potassium 4 BUN 25 creatinine 0.98.  Liver function tests within normal limits except albumin of 3.4.  TSH-1.003  Chol 203 HDL 53 LDL 135 triglycerides 74  09-07-12: wbc 4.9; hgb 13.5; hct 38.8; mcv 89.2; plt 210; tsh 1.648; tegretol 4.1  09-21-12: wbc 4.8; hgb 12.6; hct 36.8; mcv 887. plt 271; glucose 106; bun 17; creat  0.85; k+ 3.9  Na++ 135; liver normal albumin 3.3  09-24-12: wbc 6.3; hgb 8.9; hct 27.6 ;mcv 84.1 plt 232; glucose 89; bun 36; creat 1.73; k+ 3.7; na++ 143  Alk phos 68; alt <8 ast 11; albumin 3.2  Review of Systems  Constitutional: Feels somewhat fatigued-otherwise no complaints.  Throat-negative for sore throat  Respiratory: Positive dry cough-negative for shortness of breath.  Cardiovascular: Negative for chest pain.  Gastrointestinal: Not complaining of any abdominal discomfort nausea or  vomiting Musculoskeletal: Negative for myalgias and joint pain.  Skin: Negative.for rash or itching  Neurological: Negative for headaches .  Psychiatric/Behavioral: Negative for depression. The patient does not have insomnia--she continues to be pleasant and engaged  .  Physical Exam   Temperature 98.8 pulse 66 respirations 18 blood pressure 112/72-134/76 most recently--O2 saturation has been in the 90s Constitutional: She appears well-developed and well-nourished. Pleasant--lying comfortably in bed  skin-is warm and dry  Eyes-pupils appear equal round react to light .  Visual acuity appears grossly intact.  Oropharynx clear mucous membranes moist  Cardiovascular--regular rate and rhythm without murmur gallop or rub-minimal lower extremity edema  Respiratory: Effort normal and breath sounds normal with possibly slightly reduced breath sounds on the right. No respiratory distress.  GI: Soft. Bowel sounds are normal. She exhibits no distension. There is no tenderness. Bowel sounds are positive  GU-could not appreciate any suprapubic tenderness Musculoskeletal: She exhibits minmal edema--I. --Moves all extremities x4  Able to move all extremities -and appears at baseline  Neurological: She is alert-- no lateralizing findings--her speech is clear t.  Skin: Skin is warm and dry.  Psychiatric: She has a normal mood and affect. Pleasant and interactive appropriate  ASSESSMENT/ PLAN   Intermittent low-grade temperature With cough-have concerns about respiratory issue-Will check a chest x-ray and monitor vital signs pulse ox Q. shift x72 hours-also will start Mucinex 600 mg twice a day for 5 days and monitor this  Also will update blood work including a CBC with differential and basic metabolic panel-.  Also appears that time she does have a history of UTIs we'll order a UA C&S as well :  Pulmonary embolus  Continues on Xarelto--has been stable  .  Essential  hypertension, benign  On lisinopril 5 mg a day---this was reduced secondary to low blood pressure readings---appears stable But will have to be monitored..  .  Edema- at this point does not appear to be an issue she had been on low-dose Lasix this was discontinued secondary to low blood pressures nonetheless this appears to be stable   Unspecified hypothyroidism  Is stable will continue synthroid 75 mcg daily--most recent TSH in August was satisfactory at 2.29  Depression  Is stable will continue effexor xr 75 mg daily; and low-dose Ativan 0.25 mg once a day as needed for anxiety   Dementia, multi-infarct  Is without change will continue namenda 10 mg twice daily and will continue to monitor her status--her dementia appears to be moderate mild  GERD (gastroesophageal reflux disease)  Will continue pepcid 20 mg daily   Hyposmolality and/or hyponatremia  Is stable will continue sodium chloride 1 gm twice daily and will continue to monitor her status --will update BMP  Unspecified constipation  Will continue miralax daily will continue colace twice daily   Osteoarthritis  Is stable will continue tylenol arthritis twice daily and vicodin 5/325 mg every 4 hours as needed  .  Question hyperlipidemia-LDL has been mildly elevated however with patient's relatively advanced age  and comorbidities would be hesitant to be real aggressive here-    [id number]               Therapy Notes     No notes of this type exist for this encounter.     Not recorded        Other Encounter Related Information     Allergies & Medications    Problem List    History    Patient-Entered Questionnaires      Level of Service     PR SBSQ NURSING FACIL CARE/DAY NEW PROBLEM 25 MIN [41282]         All Flowsheet Templates (all recorded)     Nursing Home Patient Info     All Charges for This Encounter     Code Description Service Date Service Provider  Modifiers Qty    [place] PROBLEM 25 MIN 05/10/2014 Wille Celeste, PA-C  1      Routing History     There are no sent or routed communications associated with this encounter.     AVS Reports     No AVS Snapshots are available for this encounter.     Smoking Cessation Audit Trail       Diabetic Foot Exam    No data filed     Diabetic Foot Form - Detailed    No data filed     Diabetic Foot Exam - Simple    No data filed     Guarantor Account: Dotty, Gonzalo ([id number])     Relation to Patient: Account Type Service Area    Self Personal/Family [place]       Coverages for This Account     Coverage ID Payor Plan Insurance ID    [id number] [place] [id number]    [place] [id number] T        Guarantor Account: Lissie, Hinesley ([id number])     Relation to Patient: Account Type Service Area    Self Personal/Family [place] for This Account     Coverage ID Payor Plan Insurance ID    [phone] [place] [id number]    [place] [id number] T        Guarantor Account: Bobette, Leyh ([id number])     Relation to Patient: Account Type Service Area    Self Personal/Family [facility]       This encounter was created in error - please disregard.

## 2014-06-07 NOTE — Progress Notes (Signed)
Patient ID: Tricia Martin, female   DOB: 11-25-1930, 80 y.o.   MRN: [id number]   This is a routine visit.  Level care skilled.  Facility [facility].  Chief complaint-medical management of chronic issues including pulmonary embolism-dementia-hypothyroidism-hypertension-.  History of present illness.  Patient's a very pleasant elderly resident who has been quite stable.  I did see her last month for intermittent fever with cough chest x-ray was nondiagnostic and urine appear to be fairly benign this has resolved.  She has no complaints night does not complain of any chest pain or shortness of breath pressures are somewhat variable I see as low as 97/61-I did take it manually tonight and got 142/94 appears to be in this range I do not see consistent lows or highs--she is on lisinopril 5 mg a day this was recently reduced secondary to some low blood pressure readings.  Apparently she does have a good appetite.  In regards to history of pulmonary embolus she is on Xarelto--  Tonight she is pleasant conversant appears to be fairly up-to-date on current events-I suspect her dementia is quite mild.  Family medical social history as been reviewed per most recent progress note 05/10/2014.  Medications have been reviewed per MAR.  Review of systems.  In general no complaints of fever or chills.  Respiratory no complaints of cough or shortness of breath. Head ears eyes nose mouth and throatno sore throat or visual changes she does have prescription lenses.  Cardiac does not complain of any chest pain has some mild dependent edema she says resolves when she goes to bed.  GI does not complain of any abdominal pain nausea or vomiting or constipation.  GU-is complaining of some increased frequency does not really complain of burning however  Musculoskeletal no complaints of joint pain.  Neurologic is not complaining of any headache or dizziness.  In psych does not complain of depression  she does have a history of this but this appears to be quite well controlled.  Physical exam.  She is afebrile pulse 80 respirations 18 blood pressure as noted in history of present illness I got 142/94 manually this evening.  In general this is a very pleasant elderly female in no distress sitting comfortably on the side of her bed.  Her skin is warm and dry.  Eyes she has prescription lenses pupils appear reactive to light visual acuity appears intact.  Oropharynx is clear mucous membranes moist.  Chest is clear to auscultation no labored breathing.  Heart is regular rate and rhythm without murmur gallop or rub has mild lower extremity edema she says this resolves when she has her legs elevated.  Abdomen is soft nontender with positive bowel sounds.  GU could not really appreciate any suprapubic distention or tenderness.  musculoskeletal-moves all extremities 4 I do not note any deformities.  Neurologic do not see any lateralizing findings cranial nerves are intact her speech is clear.  Psych she appears grossly alert and oriented very pleasant appropriate was able to discuss current events.--including politics in the recent a [place] and par[place].  Labs.  05/11/2014.  Sodium 135 potassium 3.8 BUN 19 creatinine 0.8.  WBC 4.2 hemoglobin 12.1 platelets 184.    March 16 2014.  WBC 5.4 hemoglobin 12.2 platelets 231.  Liver function tests within normal limits   TSH 2 .29.  02/04/2014.  Cholesterol 203 HDL 61 LDL 129 triglycerides 67    Assessment and plan.  #1-history of pulmonary embolism-this appears stable she continues on  Xarelto  #2-history hypothyroidism she is on Synthroid we will update a TSH most recent one was within normal limits.  #3 multi-infarct dementia-this appears to be quite mild she is on Namenda she appears to be doing very well in this setting.  #4-history of GERD she is stable on Pepcid.  #5 history of hypoosmolality and/or  hyponatremia-she is on sodium chloride 2 times a day will check a metabolic panel to ensure stability.  #6-history of osteoarthritis this appears to be well controlled on the Tylenol arthritis.  #7-history of urinary frequency-will check a urinalysis and culture.  #8-hypertension-appears she has somewhat variable blood pressures at this point will monitor  #9-hyperlipidemia?- secondary to patient's relatively advanced age will be somewhat conservative here-LDL  mildlyt elevated as well as her HDL at this point will monitor \  #2-history of depression-this appears to be quite well controlled on Effexor     [phone]

## 2014-09-28 ENCOUNTER — Encounter: Payer: Self-pay | Admitting: Internal Medicine

## 2014-09-28 ENCOUNTER — Non-Acute Institutional Stay (SKILLED_NURSING_FACILITY): Payer: Medicare Other | Admitting: Internal Medicine

## 2014-09-28 DIAGNOSIS — F411 Generalized anxiety disorder: Secondary | ICD-10-CM | POA: Diagnosis not present

## 2014-09-28 DIAGNOSIS — I10 Essential (primary) hypertension: Secondary | ICD-10-CM | POA: Diagnosis not present

## 2014-09-28 DIAGNOSIS — F32A Depression, unspecified: Secondary | ICD-10-CM

## 2014-09-28 DIAGNOSIS — E038 Other specified hypothyroidism: Secondary | ICD-10-CM | POA: Diagnosis not present

## 2014-09-28 DIAGNOSIS — Z7901 Long term (current) use of anticoagulants: Secondary | ICD-10-CM

## 2014-09-28 DIAGNOSIS — E034 Atrophy of thyroid (acquired): Secondary | ICD-10-CM | POA: Diagnosis not present

## 2014-09-28 DIAGNOSIS — F329 Major depressive disorder, single episode, unspecified: Secondary | ICD-10-CM

## 2014-09-28 DIAGNOSIS — F0151 Vascular dementia with behavioral disturbance: Secondary | ICD-10-CM

## 2014-09-28 DIAGNOSIS — I2699 Other pulmonary embolism without acute cor pulmonale: Secondary | ICD-10-CM | POA: Diagnosis not present

## 2014-09-28 DIAGNOSIS — F01518 Vascular dementia, unspecified severity, with other behavioral disturbance: Secondary | ICD-10-CM

## 2014-09-28 NOTE — Assessment & Plan Note (Signed)
For PE prophylaxis;Plan- contiue xarelto

## 2014-09-28 NOTE — Assessment & Plan Note (Signed)
Xarelto as prophylaxis;Plan - continue xarelto

## 2014-09-28 NOTE — Assessment & Plan Note (Signed)
Stable on scheduled and prn ativan

## 2014-09-28 NOTE — Assessment & Plan Note (Signed)
Chronic and stable ;followed by psych;Plan - continue effexor 150 mg daily

## 2014-09-28 NOTE — Assessment & Plan Note (Signed)
Chronic ans stable without major declines; Plan - continue namenda 10 mg BID

## 2014-09-28 NOTE — Progress Notes (Signed)
MRN: [id number] Name: Tricia PentonBetty Nowakowski  Sex: female Age: 81 y.o. DOB: 1930-11-13  [facility] Facility/Room:417W Level Of Care: SNF Provider: Merrilee SeashoreALEXANDER, Dinita Migliaccio D Emergency Contacts: Extended Emergency Contact Information Primary Emergency Contact: Culclasure,[name]  [place] Home Phone: [phone] Relation: Other  Code Status: FULL  Allergies: Ciprofloxacin hcl; Codeine; Duloxetine hcl; Hctz; Metaxalone; Morphine and related; Penicillins; Quetiapine fumarate; and Zofran  Chief Complaint  Patient presents with  . Medical Management of Chronic Issues    HPI: Patient is 81 y.o. female who is being seen for routine issues.  Past Medical History  Diagnosis Date  . Hypertension   . Hyperlipidemia   . PE (pulmonary embolism)   . Hypothyroid   . OA (osteoarthritis)     Past Surgical History  Procedure Laterality Date  . Joint replacement      B knees      Medication List       This list is accurate as of: 09/28/14  7:04 PM.  Always use your most recent med list.               acetaminophen 650 MG CR tablet  Commonly known as:  TYLENOL  Take 650 mg by mouth 2 (two) times daily.     diphenoxylate-atropine 2.5-0.025 MG per tablet  Commonly known as:  LOMOTIL  Take 1 tablet by mouth 4 (four) times daily as needed for diarrhea or loose stools.     docusate sodium 100 MG capsule  Commonly known as:  COLACE  Take 100 mg by mouth 2 (two) times daily.     famotidine 20 MG tablet  Commonly known as:  PEPCID  Take 20 mg by mouth at bedtime.     fish oil-omega-3 fatty acids 1000 MG capsule  Take 1 g by mouth 2 (two) times daily.     HYDROcodone-acetaminophen 5-325 MG per tablet  Commonly known as:  NORCO/VICODIN  Take 1 tablet by mouth every 4 (four) hours as needed.     levothyroxine 75 MCG tablet  Commonly known as:  SYNTHROID, LEVOTHROID  Take 75 mcg by mouth daily.     lisinopril 10 MG tablet  Commonly known as:  PRINIVIL,ZESTRIL  Take 5  mg by mouth daily.     LORazepam 0.5 MG tablet  Commonly known as:  ATIVAN  Take 1/2 tablet by mouth every morning as needed for anxiety     memantine 10 MG tablet  Commonly known as:  NAMENDA  Take 10 mg by mouth 2 (two) times daily.     multivitamin with minerals Tabs tablet  Take 1 tablet by mouth daily.     polyethylene glycol packet  Commonly known as:  MIRALAX / GLYCOLAX  Take 17 g by mouth daily.     rivaroxaban 20 MG Tabs tablet  Commonly known as:  XARELTO  Take 20 mg by mouth daily with supper.     sodium chloride 1 G tablet  Take 1 g by mouth 2 (two) times daily.     venlafaxine XR 75 MG 24 hr capsule  Commonly known as:  EFFEXOR-XR  Take 150 mg by mouth daily.        No orders of the defined types were placed in this encounter.    Immunization History  Administered Date(s) Administered  . Influenza-Unspecified 05/20/2014    History  Substance Use Topics  . Smoking status: Never Smoker   . Smokeless tobacco: Not on file  . Alcohol Use: No  Review of Systems  DATA OBTAINED: from patient; no c/o ; expressed destre to work with PT for walking GENERAL:  no fevers, fatigue, appetite changes SKIN: No itching, rash HEENT: No complaint RESPIRATORY: No cough, wheezing, SOB CARDIAC: No chest pain, palpitations, lower extremity edema  GI: No abdominal pain, No N/V/D or constipation, No heartburn or reflux  GU: No dysuria, frequency or urgency, or incontinence  MUSCULOSKELETAL: No unrelieved bone/joint pain NEUROLOGIC: No headache, dizziness  PSYCHIATRIC: No overt anxiety or sadness  Filed Vitals:   09/28/14 1823  BP: 128/71  Pulse: 61  Temp: 97 F (36.1 C)  Resp: 20    Physical Exam  GENERAL APPEARANCE: Alert, conversant, No acute distress  SKIN: No diaphoresis rash, or wounds HEENT: Unremarkable RESPIRATORY: Breathing is even, unlabored. Lung sounds are clear   CARDIOVASCULAR: Heart RRR no murmurs, rubs or gallops. No peripheral edema   GASTROINTESTINAL: Abdomen is soft, non-tender, not distended w/ normal bowel sounds.  GENITOURINARY: Bladder non tender, not distended  MUSCULOSKELETAL: No abnormal joints or musculature NEUROLOGIC: Cranial nerves 2-12 grossly intact. Moves all extremities PSYCHIATRIC: Mild dementia, forgetful, no behavioral issues  Patient Active Problem List   Diagnosis Date Noted  . Fever 05/10/2014  . Nausea with vomiting 03/14/2014  . Mouth pain 11/17/2013  . Anxiety state 02/25/2013  . Lethargy 02/25/2013  . Osteoarthritis 02/04/2013  . Edema 02/04/2013  . Other convulsions 11/27/2012  . Unspecified constipation 11/27/2012  . Pulmonary embolus 10/12/2012  . Long term current use of anticoagulant therapy 10/12/2012  . UTI (urinary tract infection) 10/12/2012  . Essential hypertension, benign 10/12/2012  . Depression 10/12/2012  . Hypothyroidism 10/12/2012  . Dementia, multi-infarct 10/12/2012  . GERD (gastroesophageal reflux disease) 10/12/2012  . Psychosis 10/12/2012  . Hypokalemia 10/12/2012  . Hyposmolality and/or hyponatremia 10/12/2012       Assessment and Plan  Essential hypertension, benign Controlled on low dose lisinopril;Plan - continue lisinopril   Pulmonary embolus Xarelto as prophylaxis;Plan - continue xarelto   Dementia, multi-infarct Chronic ans stable without major declines; Plan - continue namenda 10 mg BID   Hypothyroidism Chronic and stable;paln - continue levothyroxine 75 mcg   Depression Chronic and stable ;followed by psych;Plan - continue effexor 150 mg daily   Anxiety state Stable on scheduled and prn ativan   Long term current use of anticoagulant therapy For PE prophylaxis;Plan- contiue xarelto     Margit Hanks, MD

## 2014-09-28 NOTE — Assessment & Plan Note (Signed)
Chronic and stable;paln - continue levothyroxine 75 mcg

## 2014-09-28 NOTE — Assessment & Plan Note (Signed)
Controlled on low dose lisinopril;Plan - continue lisinopril

## 2014-10-31 ENCOUNTER — Non-Acute Institutional Stay (SKILLED_NURSING_FACILITY): Payer: Medicare Other | Admitting: Internal Medicine

## 2014-10-31 ENCOUNTER — Encounter: Payer: Self-pay | Admitting: Internal Medicine

## 2014-10-31 DIAGNOSIS — I2699 Other pulmonary embolism without acute cor pulmonale: Secondary | ICD-10-CM | POA: Diagnosis not present

## 2014-10-31 DIAGNOSIS — E038 Other specified hypothyroidism: Secondary | ICD-10-CM

## 2014-10-31 DIAGNOSIS — F0150 Vascular dementia without behavioral disturbance: Secondary | ICD-10-CM | POA: Diagnosis not present

## 2014-10-31 DIAGNOSIS — I10 Essential (primary) hypertension: Secondary | ICD-10-CM | POA: Diagnosis not present

## 2014-10-31 NOTE — Progress Notes (Signed)
Patient ID: Tricia PentonBetty Martin, female   DOB: [birth year], 81 y.o.   MRN: [id number] MRN: [id number] Name: Tricia Martin  Sex: female Age: 81 y.o. DOB: [birth year]  [facility] Facility[facility]:417W Level Of Care: SNF Provider: Roena MaladyLASSEN, Latoiya Maradiaga C Emergency Contacts: Extended Emergency Contact Information Primary Emergency Contact: Culclasure,[name]  [place] Home Phone: [phone] Relation: Other  Code Status: FULL  Allergies: Ciprofloxacin hcl; Codeine; Duloxetine hcl; Hctz; Metaxalone; Morphine and related; Penicillins; Quetiapine fumarate; and Zofran  Chief Complaint  Patient presents with  . Medical Management of Chronic Issues    HPI: Patient is 81 y.o. female who is being seen for routine issues.--Nursing staff does not report any recent issues-patient has no complaints this evening she continues to be pleasant and interactive  Past Medical History  Diagnosis Date  . Hypertension   . Hyperlipidemia   . PE (pulmonary embolism)   . Hypothyroid   . OA (osteoarthritis)     Past Surgical History  Procedure Laterality Date  . Joint replacement      B knees      Medication List       This list is accurate as of: 10/31/14  8:54 PM.  Always use your most recent med list.               acetaminophen 650 MG CR tablet  Commonly known as:  TYLENOL  Take 650 mg by mouth 2 (two) times daily.     diphenoxylate-atropine 2.5-0.025 MG per tablet  Commonly known as:  LOMOTIL  Take 1 tablet by mouth 4 (four) times daily as needed for diarrhea or loose stools.     docusate sodium 100 MG capsule  Commonly known as:  COLACE  Take 100 mg by mouth 2 (two) times daily.     famotidine 20 MG tablet  Commonly known as:  PEPCID  Take 20 mg by mouth at bedtime.     fish oil-omega-3 fatty acids 1000 MG capsule  Take 1 g by mouth 2 (two) times daily.     HYDROcodone-acetaminophen 5-325 MG per tablet  Commonly known as:  NORCO/VICODIN  Take 1 tablet by mouth every 4  (four) hours as needed.     levothyroxine 75 MCG tablet  Commonly known as:  SYNTHROID, LEVOTHROID  Take 75 mcg by mouth daily.     lisinopril 10 MG tablet  Commonly known as:  PRINIVIL,ZESTRIL  Take 5 mg by mouth daily.     LORazepam 0.5 MG tablet  Commonly known as:  ATIVAN  Take 1/2 tablet by mouth every morning as needed for anxiety     memantine 10 MG tablet  Commonly known as:  NAMENDA  Take 10 mg by mouth 2 (two) times daily.     multivitamin with minerals Tabs tablet  Take 1 tablet by mouth daily.     polyethylene glycol packet  Commonly known as:  MIRALAX / GLYCOLAX  Take 17 g by mouth daily.     rivaroxaban 20 MG Tabs tablet  Commonly known as:  XARELTO  Take 20 mg by mouth daily with supper.     sodium chloride 1 G tablet  Take 1 g by mouth 2 (two) times daily.     venlafaxine XR 75 MG 24 hr capsule  Commonly known as:  EFFEXOR-XR  Take 150 mg by mouth daily.        No orders of the defined types were placed in this encounter.    Immunization History  Administered  Date(s) Administered  . Influenza-Unspecified 05/20/2014    History  Substance Use Topics  . Smoking status: Never Smoker   . Smokeless tobacco: Not on file  . Alcohol Use: No    Review of Systems  DATA OBTAINED: from patient; no c/o ; e GENERAL:  no fevers, fatigue, appetite changes SKIN: No itching, rash HEENT: No complaint RESPIRATORY: No cough, wheezing, SOB CARDIAC: No chest pain, palpitations, lower extremity edema  GI: No abdominal pain, No N/V/D or constipation, No heartburn or reflux  GU: No dysuria, frequency or urgency, or incontinence  MUSCULOSKELETAL: No unrelieved bone/joint pain NEUROLOGIC: No headache, dizziness  PSYCHIATRIC: No overt anxiety or sadness  Filed Vitals:   10/31/14 2051  BP: 116/68  Pulse: 84  Temp: 98.4 F (36.9 C)  Resp: 18    Physical Exam  GENERAL APPEARANCE: Alert, conversant, No acute distress eating comfortably in her wheelchair   SKIN: No diaphoresis rash, or wounds HEENT: Unremarkable--oropharynx clear mucous membranes moist-she has prescription lenses visual acuity appears grossly intact RESPIRATORY: Breathing is even, unlabored. Lung sounds are clear   CARDIOVASCULAR: Heart RRR no murmurs, rubs or gallops. No peripheral edema  GASTROINTESTINAL: Abdomen is soft, non-tender, not distended w/ normal bowel sounds--it is obese.  GENITOURINARY: Bladder non tender, not distended  MUSCULOSKELETAL: No abnormal joints or musculature--ambulates in a wheelchair NEUROLOGIC: Cranial nerves 2-12 grossly intact. Moves all extremities PSYCHIATRIC: Mild dementia, forgetful, no behavioral issues--continues to be very pleasant  Patient Active Problem List   Diagnosis Date Noted  . Fever 05/10/2014  . Nausea with vomiting 03/14/2014  . Mouth pain 11/17/2013  . Anxiety state 02/25/2013  . Lethargy 02/25/2013  . Osteoarthritis 02/04/2013  . Edema 02/04/2013  . Other convulsions 11/27/2012  . Unspecified constipation 11/27/2012  . Pulmonary embolus 10/12/2012  . Long term current use of anticoagulant therapy 10/12/2012  . UTI (urinary tract infection) 10/12/2012  . Essential hypertension, benign 10/12/2012  . Depression 10/12/2012  . Hypothyroidism 10/12/2012  . Dementia, multi-infarct 10/12/2012  . GERD (gastroesophageal reflux disease) 10/12/2012  . Psychosis 10/12/2012  . Hypokalemia 10/12/2012  . Hyposmolality and/or hyponatremia 10/12/2012       Assessment and Plan  #1-hypertension this appears stable she is on low dose lisinopril.  Recent blood pressures 116/68-110/76.  #2 dementia-this appears to be quite stable she does well in this setting-she is on Namenda.  #3 history of pulmonary embolism she continues on Xarelto for anticoagulation will update CBC.  #4-hypothyroidism-she continues on Synthroid we'll update TSH.  #5-history depression this appears to be quite stable she is on Venlafaxine 35 mg  daily-she is followed by psychiatric services.  #6 history of GERD she continues on Pepcid this appears to be stable as well.  Of note we will again update a CBC and TSH for updated values also will obtain a metabolic panel comprehensive she does have a history of hypertension would like to ensure stability here  [id number]   No problem-specific assessment & plan notes found for this encounter.   Deborra Phegley C,

## 2014-11-01 LAB — CBC AND DIFFERENTIAL
HEMATOCRIT: 38 % (ref 36–46)
HEMOGLOBIN: 12.3 g/dL (ref 12.0–16.0)
PLATELETS: 188 10*3/uL (ref 150–399)
WBC: 5 10*3/mL

## 2014-11-01 LAB — BASIC METABOLIC PANEL
BUN: 24 mg/dL — AB (ref 4–21)
CREATININE: 1 mg/dL (ref <=1.1)
Glucose: 86 mg/dL
Potassium: 4.1 mmol/L (ref 3.4–5.3)
SODIUM: 146 mmol/L (ref 137–147)

## 2014-11-01 LAB — HEPATIC FUNCTION PANEL
ALK PHOS: 65 U/L (ref 25–125)
ALT: 10 U/L (ref 7–35)
AST: 17 U/L (ref 13–35)
BILIRUBIN, TOTAL: 0.2 mg/dL

## 2014-11-01 LAB — TSH: TSH: 1.01 u[IU]/mL (ref <=5.90)

## 2014-12-06 ENCOUNTER — Encounter: Payer: Self-pay | Admitting: *Deleted

## 2015-01-26 ENCOUNTER — Non-Acute Institutional Stay (SKILLED_NURSING_FACILITY): Payer: Medicare Other | Admitting: Internal Medicine

## 2015-01-26 ENCOUNTER — Encounter: Payer: Self-pay | Admitting: Internal Medicine

## 2015-01-26 DIAGNOSIS — I10 Essential (primary) hypertension: Secondary | ICD-10-CM

## 2015-01-26 DIAGNOSIS — E038 Other specified hypothyroidism: Secondary | ICD-10-CM

## 2015-01-26 DIAGNOSIS — Z7901 Long term (current) use of anticoagulants: Secondary | ICD-10-CM | POA: Diagnosis not present

## 2015-01-26 DIAGNOSIS — F0150 Vascular dementia without behavioral disturbance: Secondary | ICD-10-CM | POA: Diagnosis not present

## 2015-01-26 DIAGNOSIS — I2699 Other pulmonary embolism without acute cor pulmonale: Secondary | ICD-10-CM | POA: Diagnosis not present

## 2015-01-26 NOTE — Progress Notes (Signed)
Patient ID: Tricia Martin, female   DOB: 10-Feb-1931, 81 y.o.   MRN: [id number]  MRN: [id number] Name: Tricia Martin  Sex: female Age: 81 y.o. DOB: 10-Feb-1931  PSC #: Tricia Martin Facility/Room:417W Level Of Care: SNF Provider: Roena MaladyLASSEN, Wilkins Elpers C Emergency Contacts: Extended Emergency Contact Information Primary Emergency Contact: Martin,Tricia  [place] Home Phone: [phone] Relation: Other  Code Status: FULL  Allergies: Ciprofloxacin hcl; Codeine; Duloxetine hcl; Hctz; Metaxalone; Morphine and related; Olmesartan; Penicillins; Quetiapine fumarate; and Zofran  Chief Complaint  Patient presents with  . Medical Management of Chronic Issues    HPI: Patient is 81 y.o. female who is being seen for routine issues.--Nursing staff does not report any recent issues-patient has no complaints this evening she continues to be pleasant and interactive  Past Medical History  Diagnosis Date  . Hypertension   . Hyperlipidemia   . PE (pulmonary embolism)   . Hypothyroid   . OA (osteoarthritis)     Past Surgical History  Procedure Laterality Date  . Joint replacement      B knees      Medication List       This list is accurate as of: 01/26/15 11:59 PM.  Always use your most recent med list.               acetaminophen 650 MG CR tablet  Commonly known as:  TYLENOL  Take 650 mg by mouth 2 (two) times daily.     diphenoxylate-atropine 2.5-0.025 MG per tablet  Commonly known as:  LOMOTIL  Take 1 tablet by mouth 4 (four) times daily as needed for diarrhea or loose stools.     docusate sodium 100 MG capsule  Commonly known as:  COLACE  Take 100 mg by mouth 2 (two) times daily.     famotidine 20 MG tablet  Commonly known as:  PEPCID  Take 20 mg by mouth at bedtime. For reflux     fish oil-omega-3 fatty acids 1000 MG capsule  Take 1 g by mouth 2 (two) times daily. For hyperlipidemia     HYDROcodone-acetaminophen 5-325 MG per tablet  Commonly known as:   NORCO/VICODIN  Take 1 tablet by mouth every 4 (four) hours as needed.     levothyroxine 75 MCG tablet  Commonly known as:  SYNTHROID, LEVOTHROID  Take 75 mcg by mouth daily. For hypothyroidism     lisinopril 10 MG tablet  Commonly known as:  PRINIVIL,ZESTRIL  Take 5 mg by mouth daily. For HTN     LORazepam 0.5 MG tablet  Commonly known as:  ATIVAN  Take 1/2 tablet by mouth every morning as needed for anxiety     memantine 10 MG tablet  Commonly known as:  NAMENDA  Take 10 mg by mouth 2 (two) times daily. For dementia     multivitamin with minerals Tabs tablet  Take 1 tablet by mouth daily.     polyethylene glycol packet  Commonly known as:  MIRALAX / GLYCOLAX  Take 17 g by mouth daily.     rivaroxaban 20 MG Tabs tablet  Commonly known as:  XARELTO  Take 20 mg by mouth daily with supper.     sodium chloride 1 G tablet  Take 1 g by mouth 2 (two) times daily. For sodium supplement     venlafaxine XR 75 MG 24 hr capsule  Commonly known as:  EFFEXOR-XR  Take 75 mg by mouth daily. For depressive disorder        No  orders of the defined types were placed in this encounter.    Immunization History  Administered Date(s) Administered  . Influenza-Unspecified 05/20/2014    History  Substance Use Topics  . Smoking status: Never Smoker   . Smokeless tobacco: Not on file  . Alcohol Use: No    Review of Systems  DATA OBTAINED: from patient; no c/o ;  GENERAL:  no fevers, fatigue, appetite changes SKIN: No itching, rash HEENT: No complaint RESPIRATORY: No cough, wheezing, SOB CARDIAC: No chest pain, palpitations, lower extremity edema  GI: No abdominal pain, No N/V/D or constipation, No heartburn or reflux  GU: No dysuria, frequency or urgency, or incontinence  MUSCULOSKELETAL: No unrelieved bone/joint pain NEUROLOGIC: No headache, dizziness  PSYCHIATRIC: No overt anxiety or sadness  Filed Vitals:   01/26/15 1728  BP: 106/62  Pulse: 60  Resp: 20    Physical  Exam  GENERAL APPEARANCE: Alert, conversant, No acute distress eating comfortably in her wheelchair  SKIN: No diaphoresis rash, or wounds HEENT: Unremarkable--oropharynx clear mucous membranes moist-she has prescription lenses visual acuity appears grossly intact RESPIRATORY: Breathing is even, unlabored. Lung sounds are clear   CARDIOVASCULAR: Heart RRR no murmurs, rubs or gallops. No peripheral edema  GASTROINTESTINAL: Abdomen is soft, non-tender, not distended w/ normal bowel sounds--it is obese.  GENITOURINARY: Bladder non tender, not distended  MUSCULOSKELETAL: No abnormal joints or musculature--ambulates in a wheelchair NEUROLOGIC: Cranial nerves 2-12 grossly intact. Moves all extremities PSYCHIATRIC: Mild dementia, forgetful, no behavioral issues--continues to be very pleasant talkative interactive  Patient Active Problem List   Diagnosis Date Noted  . Fever 05/10/2014  . Nausea with vomiting 03/14/2014  . Mouth pain 11/17/2013  . Anxiety state 02/25/2013  . Lethargy 02/25/2013  . Osteoarthritis 02/04/2013  . Edema 02/04/2013  . Other convulsions 11/27/2012  . Unspecified constipation 11/27/2012  . Pulmonary embolus 10/12/2012  . Long term current use of anticoagulant therapy 10/12/2012  . UTI (urinary tract infection) 10/12/2012  . Essential hypertension, benign 10/12/2012  . Depression 10/12/2012  . Hypothyroidism 10/12/2012  . Dementia, multi-infarct 10/12/2012  . GERD (gastroesophageal reflux disease) 10/12/2012  . Psychosis 10/12/2012  . Hypokalemia 10/12/2012  . Hyposmolality and/or hyponatremia 10/12/2012     Labs.  11/01/2014.  Sodium 146 potassium 4.1 BUN 24 creatinine 1.03.  Liver function tests within normal limits except albumen of 3.47.  WBC 5.0 hemoglobin 12.3 platelets 188.  TSH-1.01    Assessment and Plan  #1-hypertension this appears stable she is on low dose lisinopril.  Recent blood pressures 108/70-106/62-this appears to be relatively  baseline patient is quite adamant she she does not want her blood pressure medication reduced this appears to be stable.  #2 dementia-this appears to be quite stable she does well in this setting-she is on Namenda.  #3 history of pulmonary embolism she continues on Xarelto for anticoagulation will update CBC.  #4-hypothyroidism-she continues on Synthroid TSH in April was within normal limits at 1.0  #5-history depression this appears to be quite stable she is on Venlafaxine y-she is followed by psychiatric services.  #6 history of GERD she continues on Pepcid this appears to be stable as well.  Of note we will  update a CBC and BMP for updated values    [id number]     Carthel Castille C,

## 2015-05-22 ENCOUNTER — Encounter: Payer: Self-pay | Admitting: Internal Medicine

## 2015-05-22 ENCOUNTER — Non-Acute Institutional Stay (SKILLED_NURSING_FACILITY): Payer: Medicare Other | Admitting: Internal Medicine

## 2015-05-22 DIAGNOSIS — I2782 Chronic pulmonary embolism: Secondary | ICD-10-CM

## 2015-05-22 DIAGNOSIS — K219 Gastro-esophageal reflux disease without esophagitis: Secondary | ICD-10-CM

## 2015-05-22 DIAGNOSIS — B001 Herpesviral vesicular dermatitis: Secondary | ICD-10-CM | POA: Diagnosis not present

## 2015-05-22 DIAGNOSIS — E038 Other specified hypothyroidism: Secondary | ICD-10-CM

## 2015-05-22 DIAGNOSIS — F329 Major depressive disorder, single episode, unspecified: Secondary | ICD-10-CM

## 2015-05-22 DIAGNOSIS — F0150 Vascular dementia without behavioral disturbance: Secondary | ICD-10-CM

## 2015-05-22 DIAGNOSIS — F32A Depression, unspecified: Secondary | ICD-10-CM

## 2015-05-22 NOTE — Progress Notes (Signed)
Patient ID: Tricia Martin, female   DOB: February 07, 1931, 81 y.o.   MRN: [id number]   MRN: [id number] Name: Tricia Martin  Sex: female Age: 81 y.o. DOB: 28-Dec-1930  [facility] Facility/Room:417W Level Of Care: SNF Provider: Roena Martin Emergency Contacts: Extended Emergency Contact Information Primary Emergency Contact: Tricia Martin,Tricia Martin  [place] Home Phone: [phone] Relation: Other  Code Status: FULL  Allergies: Ciprofloxacin hcl; Codeine; Duloxetine hcl; Hctz; Metaxalone; Morphine and related; Olmesartan; Penicillins; Quetiapine fumarate; and Zofran  Chief Complaint  Patient presents with  . Medical Management of Chronic Issues    HPI: Patient is 81 y.o. female who is being seen for routine issues.--Nursing staff does not report any recent issues other than a cold sore appearing lesion on her right lower lip-patient has no complaints today--she continues to be pleasant and interactive  Past Medical History  Diagnosis Date  . Hypertension   . Hyperlipidemia   . PE (pulmonary embolism)   . Hypothyroid   . OA (osteoarthritis)     Past Surgical History  Procedure Laterality Date  . Joint replacement      B knees      Medication List       This list is accurate as of: 05/22/15 11:59 PM.  Always use your most recent med list.               acetaminophen 650 MG CR tablet  Commonly known as:  TYLENOL  Take 650 mg by mouth 2 (two) times daily.     diphenoxylate-atropine 2.5-0.025 MG tablet  Commonly known as:  LOMOTIL  Take 1 tablet by mouth 4 (four) times daily as needed for diarrhea or loose stools.     docusate sodium 100 MG capsule  Commonly known as:  COLACE  Take 100 mg by mouth 2 (two) times daily.     famotidine 20 MG tablet  Commonly known as:  PEPCID  Take 20 mg by mouth at bedtime. For reflux     fish oil-omega-3 fatty acids 1000 MG capsule  Take 1 g by mouth 2 (two) times daily. For hyperlipidemia     HYDROcodone-acetaminophen 5-325 MG tablet  Commonly known as:  NORCO/VICODIN  Take 1 tablet by mouth every 4 (four) hours as needed.     levothyroxine 75 MCG tablet  Commonly known as:  SYNTHROID, LEVOTHROID  Take 75 mcg by mouth daily. For hypothyroidism     lisinopril 10 MG tablet  Commonly known as:  PRINIVIL,ZESTRIL  Take 5 mg by mouth daily. For HTN     LORazepam 0.5 MG tablet  Commonly known as:  ATIVAN  Take 1/2 tablet by mouth every morning as needed for anxiety     memantine 10 MG tablet  Commonly known as:  NAMENDA  Take 10 mg by mouth 2 (two) times daily. For dementia     multivitamin with minerals Tabs tablet  Take 1 tablet by mouth daily.     polyethylene glycol packet  Commonly known as:  MIRALAX / GLYCOLAX  Take 17 g by mouth daily.     rivaroxaban 20 MG Tabs tablet  Commonly known as:  XARELTO  Take 20 mg by mouth daily with supper.     sodium chloride 1 G tablet  Take 1 g by mouth 2 (two) times daily. For sodium supplement     venlafaxine XR 75 MG 24 hr capsule  Commonly known as:  EFFEXOR-XR  Take 75 mg by mouth daily. For depressive disorder  Immunization History  Administered Date(s) Administered  . Influenza-Unspecified 05/20/2014    Social History  Substance Use Topics  . Smoking status: Never Smoker   . Smokeless tobacco: Not on file  . Alcohol Use: No    Review of Systems  DATA OBTAINED: from patient; no c/o ;  GENERAL:  no fevers, fatigue, appetite changes SKIN: No itching, rash HEENT: Only complaint is a cold sore appearing lesion on her lower right lip RESPIRATORY: No cough, wheezing, SOB CARDIAC: No chest pain, palpitations, lower extremity edema  GI: No abdominal pain, No N/V/D or constipation, No heartburn or reflux  GU: No dysuria, frequency or urgency, or incontinence  MUSCULOSKELETAL: No unrelieved bone/joint pain NEUROLOGIC: No headache, dizziness  PSYCHIATRIC: No overt anxiety or sadness  Filed Vitals:    05/22/15 1914  BP: 125/75  Pulse: 80  Resp: 18    Physical Exam  GENERAL APPEARANCE: Alert, conversant, No acute distresssitting comfortably in her wheelchair  SKIN: No diaphoresis rash, or wounds HEENT:--oropharynx clear mucous membranes moist-she has prescription lenses visual acuity appears grossly intact--.  On lower right lip there is a small blister cold sore type looking lesion  RESPIRATORY: Breathing is even, unlabored. Lung sounds are clear   CARDIOVASCULAR: Heart RRR no murmurs, rubs or gallops. No peripheral edema  GASTROINTESTINAL: Abdomen is soft, non-tender, not distended w/ normal bowel sounds--it is obese.  GENITOURINARY: Bladder non tender, not distended  MUSCULOSKELETAL: No abnormal joints or musculature--ambulates in a wheelchair NEUROLOGIC: Cranial nerves 2-12 grossly intact. Moves all extremities PSYCHIATRIC: Mild dementia, forgetful, no behavioral issues--continues to be very pleasant talkative interactive  Patient Active Problem List   Diagnosis Date Noted  . Fever 05/10/2014  . Nausea with vomiting 03/14/2014  . Mouth pain 11/17/2013  . Anxiety state 02/25/2013  . Lethargy 02/25/2013  . Osteoarthritis 02/04/2013  . Edema 02/04/2013  . Other convulsions 11/27/2012  . Unspecified constipation 11/27/2012  . Pulmonary embolus (HCC) 10/12/2012  . Long term current use of anticoagulant therapy 10/12/2012  . UTI (urinary tract infection) 10/12/2012  . Essential hypertension, benign 10/12/2012  . Depression 10/12/2012  . Hypothyroidism 10/12/2012  . Dementia, multi-infarct 10/12/2012  . GERD (gastroesophageal reflux disease) 10/12/2012  . Psychosis 10/12/2012  . Hypokalemia 10/12/2012  . Hyposmolality and/or hyponatremia 10/12/2012     Labs  01/31/2015.  WBC 4.0 hemoglobin 12.5 platelets 183.  Sodium 136 potassium 4.4 BUN 21 creatinine 0.8.  Marland Kitchen  11/01/2014.  Sodium 146 potassium 4.1 BUN 24 creatinine 1.03.  Liver function tests within normal  limits except albumen of 3.47.  WBC 5.0 hemoglobin 12.3 platelets 188.  TSH-1.01    Assessment and Plan  #1-hypertension this appears stable she is on low dose lisinopril.  Recent blood pressures 125/75-131/89-111/66 -this appears to be relatively baseline--she is on lisinopril 5 mg daily  .  #2 dementia-this appears to be quite stable she does well in this setting-she is on Namenda.  #3 history of pulmonary embolism she continues on Xarelto for anticoagulation will update CBC.  #4-hypothyroidism-she continues on Synthroid TSH in April was within normal limits at 1.0--will update this  #5-history depression this appears to be quite stable she is on Venlafaxine y-she is followed by psychiatric services.  #6 history of GERD she continues on Pepcid this appears to be stable as well.  Of note we will  update a CBC and BMP for updated values--as well as a TSH.  #7 history of what appears to be herpes blister on her right lower lip will  treat with Abreva and monitor    [id number]     Danielly Ackerley C,

## 2015-07-13 ENCOUNTER — Encounter: Payer: Self-pay | Admitting: Internal Medicine

## 2015-07-13 ENCOUNTER — Non-Acute Institutional Stay (SKILLED_NURSING_FACILITY): Payer: Medicare Other | Admitting: Internal Medicine

## 2015-07-13 DIAGNOSIS — I10 Essential (primary) hypertension: Secondary | ICD-10-CM

## 2015-07-13 DIAGNOSIS — M150 Primary generalized (osteo)arthritis: Secondary | ICD-10-CM | POA: Diagnosis not present

## 2015-07-13 DIAGNOSIS — F0150 Vascular dementia without behavioral disturbance: Secondary | ICD-10-CM

## 2015-07-13 DIAGNOSIS — I2782 Chronic pulmonary embolism: Secondary | ICD-10-CM

## 2015-07-13 DIAGNOSIS — E038 Other specified hypothyroidism: Secondary | ICD-10-CM | POA: Diagnosis not present

## 2015-07-13 DIAGNOSIS — M159 Polyosteoarthritis, unspecified: Secondary | ICD-10-CM

## 2015-07-13 NOTE — Progress Notes (Signed)
Patient ID: Tricia Martin, female   DOB: Mar 10, 1931, 81 y.o.   MRN: [id number]    MRN: [id number] Name: Tricia Martin  Sex: female Age: 81 y.o. DOB: [birth year]  [facility] Facility/Room:417W Level Of Care: SNF Provider: Roena Martin Emergency Contacts: Extended Emergency Contact Information Primary Emergency Contact: Martin,Tricia  [place] Home Phone: [phone] Relation: Other  Code Status: FULL  Allergies: Ciprofloxacin hcl; Codeine; Duloxetine hcl; Hctz; Metaxalone; Morphine and related; Olmesartan; Penicillins; Quetiapine fumarate; and Zofran  Chief Complaint  Patient presents with  . Medical Management of Chronic Issues   including hypertension hyperlipidemia history of pulmonary embolism hypothyroidism and osteoarthritis dementia  HPI: Patient is 81 y.o. female who is being seen for routine issues.--Nursing staff does not report any recent issues -patient has no complaints today--she continues to be pleasant and interactive She is on lisinopril for hypertension recent blood pressures 112/68-101/62-119/76.  She is on Synthroid for hypothyroidism 75 g TSH back in November was 1.23.  She also has a history of pulmonary embolism she is on Xarelto has not been an issue during her stay here  In regards to dementia this appears to be stable--I would say mild-to-moderate she is on Namenda  Past Medical History  Diagnosis Date  . Hypertension   . Hyperlipidemia   . PE (pulmonary embolism)   . Hypothyroid   . OA (osteoarthritis)     Past Surgical History  Procedure Laterality Date  . Joint replacement      B knees      Medication List       This list is accurate as of: 07/13/15 11:59 PM.  Always use your most recent med list.               acetaminophen 650 MG CR tablet  Commonly known as:  TYLENOL  Take 650 mg by mouth 2 (two) times daily.     diphenoxylate-atropine 2.5-0.025 MG tablet  Commonly known as:  LOMOTIL  Take  1 tablet by mouth 4 (four) times daily as needed for diarrhea or loose stools.     docusate sodium 100 MG capsule  Commonly known as:  COLACE  Take 100 mg by mouth 2 (two) times daily.     famotidine 20 MG tablet  Commonly known as:  PEPCID  Take 20 mg by mouth at bedtime. For reflux     fish oil-omega-3 fatty acids 1000 MG capsule  Take 1 g by mouth 2 (two) times daily. For hyperlipidemia     levothyroxine 75 MCG tablet  Commonly known as:  SYNTHROID, LEVOTHROID  Take 75 mcg by mouth daily. For hypothyroidism     lisinopril 10 MG tablet  Commonly known as:  PRINIVIL,ZESTRIL  Take 5 mg by mouth daily. For HTN     LORazepam 0.5 MG tablet  Commonly known as:  ATIVAN  Take 1/2 tablet by mouth every morning as needed for anxiety     memantine 10 MG tablet  Commonly known as:  NAMENDA  Take 10 mg by mouth 2 (two) times daily. For dementia     multivitamin with minerals Tabs tablet  Take 1 tablet by mouth daily.     polyethylene glycol packet  Commonly known as:  MIRALAX / GLYCOLAX  Take 17 g by mouth daily.     rivaroxaban 20 MG Tabs tablet  Commonly known as:  XARELTO  Take 20 mg by mouth daily with supper.     sodium chloride 1 G tablet  Take 1 g by mouth 2 (two) times daily. For sodium supplement     venlafaxine XR 75 MG 24 hr capsule  Commonly known as:  EFFEXOR-XR  Take 75 mg by mouth daily. For depressive disorder          Immunization History  Administered Date(s) Administered  . Influenza-Unspecified 05/20/2014    Social History  Substance Use Topics  . Smoking status: Never Smoker   . Smokeless tobacco: Not on file  . Alcohol Use: No    Review of Systems  DATA OBTAINED: from patient; no c/o ;  GENERAL:  no fevers, fatigue, appetite changes SKIN: No itching, rash HEENT: Cold sore on lip previously seen has resolved RESPIRATORY: No cough, wheezing, SOB CARDIAC: No chest pain, palpitations, lower extremity edema  GI: No abdominal pain, No N/V/D  or constipation, No heartburn or reflux  GU: No dysuria, frequency or urgency, or incontinence  MUSCULOSKELETAL: No unrelieved bone/joint pain NEUROLOGIC: No headache, dizziness  PSYCHIATRIC: No overt anxiety or sadness  Filed Vitals:   07/13/15 1703  BP: 112/68  Pulse: 84  Temp: 97.1 F (36.2 C)  Resp: 18    Physical Exam  GENERAL APPEARANCE: Alert, conversant, No acute distresssitting comfortably in her wheelchair  SKIN: No diaphoresis rash, or wounds HEENT:--oropharynx clear mucous membranes moist-she has prescription lenses visual acuity appears grossly intact--.  Cold sore appearing lesion on lower lip has resolved  RESPIRATORY: Breathing is even, unlabored. Lung sounds are clear   CARDIOVASCULAR: Heart RRR no murmurs, rubs or gallops. No peripheral edema  GASTROINTESTINAL: Abdomen is soft, non-tender, not distended w/ normal bowel sounds--it is obese.  GENITOURINARY: Bladder non tender, not distended  MUSCULOSKELETAL: No abnormal joints or musculature--ambulates in a wheelchair NEUROLOGIC: Cranial nerves 2-12 grossly intact. Moves all extremities PSYCHIATRIC: Mild dementia, forgetful, no behavioral issues--continues to be very pleasant talkative interactive  Patient Active Problem List   Diagnosis Date Noted  . Fever 05/10/2014  . Nausea with vomiting 03/14/2014  . Mouth pain 11/17/2013  . Anxiety state 02/25/2013  . Lethargy 02/25/2013  . Osteoarthritis 02/04/2013  . Edema 02/04/2013  . Other convulsions 11/27/2012  . Unspecified constipation 11/27/2012  . Pulmonary embolus ([facility]) 10/12/2012  . Long term current use of anticoagulant therapy 10/12/2012  . UTI (urinary tract infection) 10/12/2012  . Essential hypertension, benign 10/12/2012  . Depression 10/12/2012  . Hypothyroidism 10/12/2012  . Dementia, multi-infarct 10/12/2012  . GERD (gastroesophageal reflux disease) 10/12/2012  . Psychosis 10/12/2012  . Hypokalemia 10/12/2012  . Hyposmolality and/or  hyponatremia 10/12/2012     Labs  05/23/2015.  Sodium 140 potassium 3.9 BUN 17 creatinine 0.9 CO2 33.  Liver function tests within normal limits.  TSH-1.23.  WBC 3.9 hemoglobin 13.4 platelets 188  01/31/2015.  WBC 4.0 hemoglobin 12.5 platelets 183.  Sodium 136 potassium 4.4 BUN 21 creatinine 0.8.  Marland Kitchen.  11/01/2014.  Sodium 146 potassium 4.1 BUN 24 creatinine 1.03.  Liver function tests within normal limits except albumen of 3.47.  WBC 5.0 hemoglobin 12.3 platelets 188.  TSH-1.01    Assessment and Plan  #1-hypertension this appears stable she is on low dose lisinopril.  Recent blood pressures 101/62-119/76-112/68-this appears to be relatively baseline--she is on lisinopril 5 mg daily  Will update BMP .  #2 dementia-this appears to be quite stable she does well in this setting-she is on Namenda.  #3 history of pulmonary embolism she continues on Xarelto for anticoagulation will update CBC.  #4-hypothyroidism-she continues on Synthroid TSH on 05/23/2015 was within  normal range at 1.23  #5-history depression this appears to be quite stable she is on Venlafaxine -she is followed by psychiatric services.  #6 history of GERD she continues on Pepcid this appears to be stable as well.  [id number]       [id number]     Sajjad Honea C,

## 2015-09-01 ENCOUNTER — Encounter: Payer: Self-pay | Admitting: Internal Medicine

## 2015-09-01 ENCOUNTER — Non-Acute Institutional Stay (SKILLED_NURSING_FACILITY): Payer: Medicare Other | Admitting: Internal Medicine

## 2015-09-01 DIAGNOSIS — K219 Gastro-esophageal reflux disease without esophagitis: Secondary | ICD-10-CM

## 2015-09-01 DIAGNOSIS — I10 Essential (primary) hypertension: Secondary | ICD-10-CM | POA: Diagnosis not present

## 2015-09-01 DIAGNOSIS — F0150 Vascular dementia without behavioral disturbance: Secondary | ICD-10-CM

## 2015-09-01 LAB — HEMOGLOBIN A1C: HEMOGLOBIN A1C: 5.5

## 2015-09-01 NOTE — Progress Notes (Signed)
MRN: [id number] Name: Tricia Martin  Sex: female Age: 82 y.o. DOB: 08-12-1930  [facility] Facility/Room:417B Level Of Care: SNF Provider: Inocencio Homes D Emergency Contacts: Extended Emergency Contact Information Primary Emergency Contact: Culclasure,[name]  [place] Phone: [phone] Relation: Other  Code Status:   Allergies: Ciprofloxacin hcl; Codeine; Duloxetine hcl; Hctz; Metaxalone; Morphine and related; Olmesartan; Penicillins; Quetiapine fumarate; and Zofran  Chief Complaint  Patient presents with  . Medical Management of Chronic Issues    HPI: Patient is 82 y.o. female who is being seen for routine issues of HTN, dementia and GERD.  Past Medical History  Diagnosis Date  . Hypertension   . Hyperlipidemia   . PE (pulmonary embolism)   . Hypothyroid   . OA (osteoarthritis)     Past Surgical History  Procedure Laterality Date  . Joint replacement      B knees      Medication List       This list is accurate as of: 09/01/15 11:59 PM.  Always use your most recent med list.               acetaminophen 650 MG CR tablet  Commonly known as:  TYLENOL  Take 650 mg by mouth 2 (two) times daily.     diphenoxylate-atropine 2.5-0.025 MG tablet  Commonly known as:  LOMOTIL  Take 1 tablet by mouth 4 (four) times daily as needed for diarrhea or loose stools.     docusate sodium 100 MG capsule  Commonly known as:  COLACE  Take 100 mg by mouth 2 (two) times daily.     famotidine 20 MG tablet  Commonly known as:  PEPCID  Take 20 mg by mouth at bedtime. For reflux     fish oil-omega-3 fatty acids 1000 MG capsule  Take 1 g by mouth 2 (two) times daily. For hyperlipidemia     levothyroxine 75 MCG tablet  Commonly known as:  SYNTHROID, LEVOTHROID  Take 75 mcg by mouth daily. For hypothyroidism     lisinopril 10 MG tablet  Commonly known as:  PRINIVIL,ZESTRIL  Take 5 mg by mouth daily. For HTN     LORazepam 0.5 MG tablet   Commonly known as:  ATIVAN  Take 1/2 tablet by mouth every morning as needed for anxiety     memantine 10 MG tablet  Commonly known as:  NAMENDA  Take 10 mg by mouth 2 (two) times daily. For dementia     multivitamin with minerals Tabs tablet  Take 1 tablet by mouth daily.     polyethylene glycol packet  Commonly known as:  MIRALAX / GLYCOLAX  Take 17 g by mouth daily.     rivaroxaban 20 MG Tabs tablet  Commonly known as:  XARELTO  Take 20 mg by mouth daily with supper.     sodium chloride 1 g tablet  Take 1 g by mouth 2 (two) times daily. For sodium supplement     venlafaxine XR 75 MG 24 hr capsule  Commonly known as:  EFFEXOR-XR  Take 75 mg by mouth daily. For depressive disorder        No orders of the defined types were placed in this encounter.    Immunization History  Administered Date(s) Administered  . Influenza-Unspecified 05/20/2014    Social History  Substance Use Topics  . Smoking status: Never Smoker   . Smokeless tobacco: Not on file  . Alcohol Use: No    Review of Systems  DATA OBTAINED: from patient, nurse GENERAL:  no fevers, fatigue, appetite changes SKIN: No itching, rash HEENT: No complaint RESPIRATORY: No cough, wheezing, SOB CARDIAC: No chest pain, palpitations, lower extremity edema  GI: No abdominal pain, No N/V/D or constipation, No heartburn or reflux  GU: No dysuria, frequency or urgency, or incontinence  MUSCULOSKELETAL: No unrelieved bone/joint pain NEUROLOGIC: No headache, dizziness  PSYCHIATRIC: No overt anxiety or sadness  Filed Vitals:   09/01/15 1541  BP: 131/73  Pulse: 66  Temp: 97 F (36.1 C)  Resp: 20    Physical Exam  GENERAL APPEARANCE: Alert,  No acute distress  SKIN: No diaphoresis rash HEENT: Unremarkable RESPIRATORY: Breathing is even, unlabored. Lung sounds are clear   CARDIOVASCULAR: Heart RRR no murmurs, rubs or gallops. No peripheral edema  GASTROINTESTINAL: Abdomen is soft, non-tender, not  distended w/ normal bowel sounds.  GENITOURINARY: Bladder non tender, not distended  MUSCULOSKELETAL: No abnormal joints or musculature NEUROLOGIC: Cranial nerves 2-12 grossly intact. Moves all extremities PSYCHIATRIC: Mood and affect appropriate to situation with dementia, no behavioral issues  Patient Active Problem List   Diagnosis Date Noted  . Fever 05/10/2014  . Nausea with vomiting 03/14/2014  . Mouth pain 11/17/2013  . Anxiety state 02/25/2013  . Lethargy 02/25/2013  . Osteoarthritis 02/04/2013  . Edema 02/04/2013  . Other convulsions 11/27/2012  . Unspecified constipation 11/27/2012  . Pulmonary embolus ([place]) 10/12/2012  . Long term current use of anticoagulant therapy 10/12/2012  . UTI (urinary tract infection) 10/12/2012  . Essential hypertension, benign 10/12/2012  . Depression 10/12/2012  . Hypothyroidism 10/12/2012  . Dementia, multi-infarct 10/12/2012  . GERD (gastroesophageal reflux disease) 10/12/2012  . Psychosis 10/12/2012  . Hypokalemia 10/12/2012  . Hyposmolality and/or hyponatremia 10/12/2012    CBC    Component Value Date/Time   WBC 5.0 11/01/2014   WBC 4.3 04/04/2010 0915   RBC 4.28 04/04/2010 0915   HGB 12.3 11/01/2014   HCT 38 11/01/2014   PLT 188 11/01/2014   MCV 89.2 04/04/2010 0915   LYMPHSABS 0.9 04/04/2010 0915   MONOABS 0.4 04/04/2010 0915   EOSABS 0.0 04/04/2010 0915   BASOSABS 0.1 04/04/2010 0915    CMP     Component Value Date/Time   NA 146 11/01/2014   NA 138 08/28/2010 1845   K 4.1 11/01/2014   CL 98 08/28/2010 1845   CO2 31 08/28/2010 1845   GLUCOSE 89 08/28/2010 1845   BUN 24* 11/01/2014   BUN 21 08/28/2010 1845   CREATININE 1.0 11/01/2014   CREATININE 1.0 08/28/2010 1845   CALCIUM 9.6 08/28/2010 1845   AST 17 11/01/2014   ALT 10 11/01/2014   ALKPHOS 65 11/01/2014   GFRNONAA 54* 08/28/2010 1845   GFRAA  08/28/2010 1845    >60        The eGFR has been calculated using the MDRD equation. This calculation has not  been validated in all clinical situations. eGFR's persistently <60 mL/min signify possible Chronic Kidney Disease.    Assessment and Plan  Essential hypertension, benign BP controlled on Lisinopril 5 mg daily;plan - cont lisinopril  Dementia, multi-infarct Stable; cont namenda 10 mg bID  GERD (gastroesophageal reflux disease) Chronic and stable;cont pepcid 20 mg daily    [name], MD

## 2015-09-09 ENCOUNTER — Encounter: Payer: Self-pay | Admitting: Internal Medicine

## 2015-09-09 NOTE — Assessment & Plan Note (Signed)
Stable; cont namenda 10 mg bID

## 2015-09-09 NOTE — Assessment & Plan Note (Signed)
BP controlled on Lisinopril 5 mg daily;plan - cont lisinopril

## 2015-09-09 NOTE — Assessment & Plan Note (Signed)
Chronic and stable;cont pepcid 20 mg daily

## 2015-10-31 ENCOUNTER — Encounter: Payer: Self-pay | Admitting: Internal Medicine

## 2015-10-31 ENCOUNTER — Non-Acute Institutional Stay (SKILLED_NURSING_FACILITY): Payer: Medicare Other | Admitting: Internal Medicine

## 2015-10-31 DIAGNOSIS — E034 Atrophy of thyroid (acquired): Secondary | ICD-10-CM

## 2015-10-31 DIAGNOSIS — E038 Other specified hypothyroidism: Secondary | ICD-10-CM | POA: Diagnosis not present

## 2015-10-31 DIAGNOSIS — F329 Major depressive disorder, single episode, unspecified: Secondary | ICD-10-CM | POA: Diagnosis not present

## 2015-10-31 DIAGNOSIS — I2782 Chronic pulmonary embolism: Secondary | ICD-10-CM

## 2015-10-31 DIAGNOSIS — F32A Depression, unspecified: Secondary | ICD-10-CM

## 2015-10-31 NOTE — Progress Notes (Signed)
MRN: [id number] Name: Tricia Martin  Sex: female Age: 82 y.o. DOB: May 20, 1931  [place] [facility]: [facility] Facility/Room: Level Of Care: SNF Provider: Inocencio Homes D Emergency Contacts: Extended Emergency Contact Information Primary Emergency Contact: Culclasure,[name]  [place] Phone: [phone] Relation: Other  Code Status:   Allergies: Ciprofloxacin hcl; Codeine; Duloxetine hcl; Hctz; Metaxalone; Morphine and related; Olmesartan; Penicillins; Quetiapine fumarate; and Zofran  Chief Complaint  Patient presents with  . Medical Management of Chronic Issues    HPI: Patient is 82 y.o. female who is being seen for routine issues of hypothyroidism, PE, and depression.  Past Medical History  Diagnosis Date  . Hypertension   . Hyperlipidemia   . PE (pulmonary embolism)   . Hypothyroid   . OA (osteoarthritis)     Past Surgical History  Procedure Laterality Date  . Joint replacement      B knees      Medication List       This list is accurate as of: 10/31/15 11:59 PM.  Always use your most recent med list.               acetaminophen 650 MG CR tablet  Commonly known as:  TYLENOL  Take 650 mg by mouth 2 (two) times daily.     calcium-vitamin D 500-200 MG-UNIT tablet  Commonly known as:  OSCAL WITH D  Take 1 tablet by mouth 2 (two) times daily.     diphenoxylate-atropine 2.5-0.025 MG tablet  Commonly known as:  LOMOTIL  Take 1 tablet by mouth 4 (four) times daily as needed for diarrhea or loose stools.     docusate sodium 100 MG capsule  Commonly known as:  COLACE  Take 100 mg by mouth 2 (two) times daily.     famotidine 20 MG tablet  Commonly known as:  PEPCID  Take 20 mg by mouth at bedtime. For reflux     fish oil-omega-3 fatty acids 1000 MG capsule  Take 1 g by mouth 2 (two) times daily. For hyperlipidemia     levothyroxine 75 MCG tablet  Commonly known as:  SYNTHROID, LEVOTHROID  Take 75 mcg by mouth daily. For hypothyroidism      lisinopril 10 MG tablet  Commonly known as:  PRINIVIL,ZESTRIL  Take 5 mg by mouth daily. For HTN     LORazepam 0.5 MG tablet  Commonly known as:  ATIVAN  Take 1/2 tablet by mouth every morning as needed for anxiety     memantine 10 MG tablet  Commonly known as:  NAMENDA  Take 10 mg by mouth 2 (two) times daily. For dementia     multivitamin with minerals Tabs tablet  Take 1 tablet by mouth daily.     polyethylene glycol packet  Commonly known as:  MIRALAX / GLYCOLAX  Take 17 g by mouth daily.     rivaroxaban 20 MG Tabs tablet  Commonly known as:  XARELTO  Take 20 mg by mouth daily with supper.     sodium chloride 1 g tablet  Take 1 g by mouth 2 (two) times daily. For sodium supplement     venlafaxine XR 75 MG 24 hr capsule  Commonly known as:  EFFEXOR-XR  Take 75 mg by mouth daily. For depressive disorder        Meds ordered this encounter  Medications  . calcium-vitamin D (OSCAL WITH D) 500-200 MG-UNIT tablet    Sig: Take 1 tablet by mouth 2 (two) times daily.  Immunization History  Administered Date(s) Administered  . Influenza-Unspecified 05/20/2014    Social History  Substance Use Topics  . Smoking status: Never Smoker   . Smokeless tobacco: Not on file  . Alcohol Use: No    Review of Systems  DATA OBTAINED: from patient, nurse; pt no c/o GENERAL:  no fevers, fatigue, appetite changes SKIN: No itching, rash HEENT: No complaint RESPIRATORY: No cough, wheezing, SOB CARDIAC: No chest pain, palpitations, lower extremity edema  GI: No abdominal pain, No N/V/D or constipation, No heartburn or reflux  GU: No dysuria, frequency or urgency, or incontinence  MUSCULOSKELETAL: No unrelieved bone/joint pain NEUROLOGIC: No headache, dizziness  PSYCHIATRIC: No overt anxiety or sadness  Filed Vitals:   10/31/15 1718  BP: 128/70  Pulse: 80  Temp: 97.4 F (36.3 C)  Resp: 22    Physical Exam  GENERAL APPEARANCE: Alert, conversant, No acute distress   SKIN: No diaphoresis rash HEENT: Unremarkable RESPIRATORY: Breathing is even, unlabored. Lung sounds are clear   CARDIOVASCULAR: Heart RRR no murmurs, rubs or gallops. No peripheral edema  GASTROINTESTINAL: Abdomen is soft, non-tender, not distended w/ normal bowel sounds.  GENITOURINARY: Bladder non tender, not distended  MUSCULOSKELETAL: No abnormal joints or musculature NEUROLOGIC: Cranial nerves 2-12 grossly intact. Moves all extremities PSYCHIATRIC: Mood and affect appropriate to situation with dementia, no behavioral issues  Patient Active Problem List   Diagnosis Date Noted  . Fever 05/10/2014  . Nausea with vomiting 03/14/2014  . Mouth pain 11/17/2013  . Anxiety state 02/25/2013  . Lethargy 02/25/2013  . Osteoarthritis 02/04/2013  . Edema 02/04/2013  . Other convulsions 11/27/2012  . Unspecified constipation 11/27/2012  . Pulmonary embolus ([place]) 10/12/2012  . Long term current use of anticoagulant therapy 10/12/2012  . UTI (urinary tract infection) 10/12/2012  . Essential hypertension, benign 10/12/2012  . Depression 10/12/2012  . Hypothyroidism 10/12/2012  . Dementia, multi-infarct 10/12/2012  . GERD (gastroesophageal reflux disease) 10/12/2012  . Psychosis 10/12/2012  . Hypokalemia 10/12/2012  . Hyposmolality and/or hyponatremia 10/12/2012    CBC    Component Value Date/Time   WBC 5.0 11/01/2014   WBC 4.3 04/04/2010 0915   RBC 4.28 04/04/2010 0915   HGB 12.3 11/01/2014   HCT 38 11/01/2014   PLT 188 11/01/2014   MCV 89.2 04/04/2010 0915   LYMPHSABS 0.9 04/04/2010 0915   MONOABS 0.4 04/04/2010 0915   EOSABS 0.0 04/04/2010 0915   BASOSABS 0.1 04/04/2010 0915    CMP     Component Value Date/Time   NA 146 11/01/2014   NA 138 08/28/2010 1845   K 4.1 11/01/2014   CL 98 08/28/2010 1845   CO2 31 08/28/2010 1845   GLUCOSE 89 08/28/2010 1845   BUN 24* 11/01/2014   BUN 21 08/28/2010 1845   CREATININE 1.0 11/01/2014   CREATININE 1.0 08/28/2010 1845    CALCIUM 9.6 08/28/2010 1845   AST 17 11/01/2014   ALT 10 11/01/2014   ALKPHOS 65 11/01/2014   GFRNONAA 54* 08/28/2010 1845   GFRAA  08/28/2010 1845    >60        The eGFR has been calculated using the MDRD equation. This calculation has not been validated in all clinical situations. eGFR's persistently <60 mL/min signify possible Chronic Kidney Disease.    Assessment and Plan  Hypothyroidism 05/2015 TSH 1.23; cont synthroid 75 mcg daily  Pulmonary embolus Somewhere between 2012 and 2014 pt was dx with PE; pt was on coumadin initially, then xarelto; no other info known, question is  can pt come off xarelto; cont g=for now and get more info;looks like [facility] always seen [facility]  Depression Chronic and stable' continue effexor    [name], MD

## 2015-11-03 LAB — LIPID PANEL
Cholesterol: 224 mg/dL — AB (ref 0–200)
HDL: 68 mg/dL (ref 35–70)
LDL Cholesterol: 142 mg/dL
Triglycerides: 72 mg/dL (ref 40–160)

## 2015-11-03 LAB — TSH: TSH: 1.88 u[IU]/mL (ref 0.41–5.90)

## 2015-11-04 ENCOUNTER — Encounter: Payer: Self-pay | Admitting: Internal Medicine

## 2015-11-04 NOTE — Assessment & Plan Note (Signed)
05/2015 TSH 1.23; cont synthroid 75 mcg daily

## 2015-11-04 NOTE — Assessment & Plan Note (Signed)
Chronic and stable' continue effexor

## 2015-11-04 NOTE — Assessment & Plan Note (Signed)
Somewhere between 2012 and 2014 pt was dx with PE; pt was on coumadin initially, then xarelto; no other info known, question is can pt come off xarelto; cont g=for now and get more info;looks like pt always [facility] at [facility]

## 2015-12-14 ENCOUNTER — Non-Acute Institutional Stay (SKILLED_NURSING_FACILITY): Payer: Medicare Other | Admitting: Internal Medicine

## 2015-12-14 ENCOUNTER — Encounter: Payer: Self-pay | Admitting: Internal Medicine

## 2015-12-14 DIAGNOSIS — I2782 Chronic pulmonary embolism: Secondary | ICD-10-CM | POA: Diagnosis not present

## 2015-12-14 DIAGNOSIS — I10 Essential (primary) hypertension: Secondary | ICD-10-CM

## 2015-12-14 DIAGNOSIS — E038 Other specified hypothyroidism: Secondary | ICD-10-CM | POA: Diagnosis not present

## 2015-12-14 DIAGNOSIS — F0150 Vascular dementia without behavioral disturbance: Secondary | ICD-10-CM

## 2015-12-14 NOTE — Progress Notes (Signed)
Location:  [facility] Room Number: 417/W Place of Service:  SNF [phone]) Provider:  Edmon Crape  No primary care provider on file.  No care team member to display  Extended Emergency Contact Information Primary Emergency Contact: Culclasure,[name]  [place] Home Phone: [phone] Relation: Other  Code Status:  Full Code Goals of care: Advanced Directive information Advanced Directives 12/14/2015  Does patient have an advance directive? Yes  Does patient want to make changes to advanced directive? No - Patient declined  Copy of advanced directive(s) in chart? Yes     Chief Complaint  Patient presents with  . Medical Management of Chronic Issues    Routine visit  Her medical management of dementia-hypothyroidism-history of pulmonary embolism-depression--HTN--   HPI:  Pt is a 82 y.o. female seen today for medical management of a of stated issues-she continues to be quite stable-she does not have any complaints today continues to be pleasant and interactive although somewhat confused at times.  In regards to hypothyroidism she is on Synthroid TSH in April 2017 was 1.88.  She does have a history of chronic pulmonary embolism continues on Xarelto and has tolerated this well.  In regards to dementia as she is on Namenda she does have Ativan as well the morning if needed although it does not appear this is a significant problem at this time.  She does have a history of hypertension on low dose lisinopril 10 mg a day recent blood pressures 118/76-128/64  Currently again she has no complaints vital signs are stable   Past Medical History  Diagnosis Date  . Hypertension   . Hyperlipidemia   . PE (pulmonary embolism)   . Hypothyroid   . OA (osteoarthritis)    Past Surgical History  Procedure Laterality Date  . Joint replacement      B knees    Allergies  Allergen Reactions  . Ciprofloxacin Hcl   . Codeine   .  Duloxetine Hcl   . Hctz [Hydrochlorothiazide]   . Metaxalone   . Morphine And Related   . Olmesartan   . Penicillins   . Quetiapine Fumarate   . Zofran [Ondansetron Hcl]       Medication List       This list is accurate as of: 12/14/15 11:30 AM.  Always use your most recent med list.               calcium-vitamin D 500-200 MG-UNIT tablet  Commonly known as:  OSCAL WITH D  Take 1 tablet by mouth 2 (two) times daily.     docusate sodium 100 MG capsule  Commonly known as:  COLACE  Take 100 mg by mouth 2 (two) times daily.     famotidine 20 MG tablet  Commonly known as:  PEPCID  Take 20 mg by mouth at bedtime. For reflux     fish oil-omega-3 fatty acids 1000 MG capsule  Take 1 g by mouth 2 (two) times daily. For hyperlipidemia     levothyroxine 75 MCG tablet  Commonly known as:  SYNTHROID, LEVOTHROID  Take 75 mcg by mouth daily. For hypothyroidism     lisinopril 10 MG tablet  Commonly known as:  PRINIVIL,ZESTRIL  Take 5 mg by mouth daily. For HTN     memantine 10 MG tablet  Commonly known as:  NAMENDA  Take 10 mg by mouth 2 (two) times daily. For dementia     multivitamin with minerals Tabs tablet  Take  1 tablet by mouth daily.     polyethylene glycol packet  Commonly known as:  MIRALAX / GLYCOLAX  Take 17 g by mouth daily.     rivaroxaban 20 MG Tabs tablet  Commonly known as:  XARELTO  Take 20 mg by mouth daily with supper.     sodium chloride 1 g tablet  Take 1 g by mouth 2 (two) times daily. For sodium supplement     TYLENOL 8 HOUR ARTHRITIS PAIN 650 MG CR tablet  Generic drug:  acetaminophen  Take 650 mg by mouth 2 (two) times daily. For arthritis     venlafaxine 37.5 MG tablet  Commonly known as:  EFFEXOR  Take 37.5 mg by mouth 2 (two) times daily.        Review of Systems  In general no complaints of fever or chills.  Skin does not complain of rashes or itching.  Head ears eyes nose mouth and throat no complaints of visual changes or sore  throat.  Respiratory denies E shortness of breath or cough.  Cardiac no chest pain has chronic lower extremity edema.  GI is not complaining of nausea vomiting diarrhea constipation or abdominal discomfort.  GU is not complaining of dysuria.  Muscle skeletal is not complaining of joint pain does have a history of osteoarthritis but this appears controlled.  Neurologic is not complaining of dizziness headache or syncopal episodes.  Psych does have a history of dementia but appears to be doing quite well with supportive care Immunization History  Administered Date(s) Administered  . Influenza-Unspecified 05/20/2014   Pertinent  Health Maintenance Due  Topic Date Due  . DEXA SCAN  12/13/2016 (Originally 11/02/1997)  . PNA vac Low Risk Adult (1 of 2 - PCV13) 12/13/2016 (Originally 11/02/1997)  . INFLUENZA VACCINE  02/20/2016   No flowsheet data found. Functional Status Survey:    Filed Vitals:   12/14/15 1110  BP: 128/70  Pulse: 74  Temp: 97.4 F (36.3 C)  TempSrc: Oral  Resp: 20   There is no weight on file to calculate BMI. Physical Exam   In general this is a pleasant elderly female in no distress sitting comfortably in her wheelchair.  Her skin is warm and dry.  Eyes pupils appear reactive light sclera and conjunctiva are clear visual acuity appears grossly intact she has prescription lenses.  Oropharynx is clear mucous membranes moist.  Chest is clear to auscultation there is no labored breathing.  Heart is regular rate and rhythm with occasional irregular beat she has chronic 1+ lower extremity edema suspect there is an element of venous stasis a.  Her abdomen soft obese soft nontender with positive bowel sounds.  Muscle skeletal ambulates largely in a wheelchair moves all extremities 4 I do not note any deformities.  Neurologic is grossly intact speech is clear no lateralizing findings.  Psych she is oriented to self behaviors continued be appropriate I  suspect her dementia is moderate-she cannot really tell me her age but thought she was in her 66s  Labs reviewed: No results for input(s): NA, K, CL, CO2, GLUCOSE, BUN, CREATININE, CALCIUM, MG, PHOS in the last 8760 hours. No results for input(s): AST, ALT, ALKPHOS, BILITOT, PROT, ALBUMIN in the last 8760 hours. No results for input(s): WBC, NEUTROABS, HGB, HCT, MCV, PLT in the last 8760 hours. Lab Results  Component Value Date   TSH 1.01 11/01/2014   No results found for: HGBA1C No results found for: CHOL, HDL, LDLCALC, LDLDIRECT, TRIG, CHOLHDL  Significant Diagnostic Results in last 30 days:  No results found.  Assessment/Plan  #1-history of dementia this appears to be quite stable-continues on Namenda-doing well with supportive care nursing does not report any issues.  #2 history hypothyroidism TSH last month was 1.88 at this point will monitor periodic intervals she is on Synthroid.  #3 history of pulmonary embolism she is on Xarelto this has been stable.  #4-hypertension she is on low-dose lisinopril 10 mg a day this appears stable blood pressures recently 118/76-128/64-110/60.  5 history of depression this is stable on Effexor continues to be pleasant involved interactive.  Of note Will update a CBC and metabolic panel including liver function tests next laboratory day for updated values-with history of hypertension is on anticoagulation as well   [id number]       [place] SheerLuster, Sally C, CMA [phone]

## 2015-12-20 LAB — CBC AND DIFFERENTIAL
HCT: 42 % (ref 36–46)
Hemoglobin: 14.1 g/dL (ref 12.0–16.0)
PLATELETS: 254 10*3/uL (ref 150–399)
WBC: 5.8 10*3/mL

## 2016-02-14 ENCOUNTER — Non-Acute Institutional Stay (SKILLED_NURSING_FACILITY): Payer: Medicare Other | Admitting: Internal Medicine

## 2016-02-14 ENCOUNTER — Encounter: Payer: Self-pay | Admitting: Internal Medicine

## 2016-02-14 DIAGNOSIS — K219 Gastro-esophageal reflux disease without esophagitis: Secondary | ICD-10-CM

## 2016-02-14 DIAGNOSIS — I10 Essential (primary) hypertension: Secondary | ICD-10-CM | POA: Diagnosis not present

## 2016-02-14 DIAGNOSIS — F0150 Vascular dementia without behavioral disturbance: Secondary | ICD-10-CM

## 2016-02-14 NOTE — Progress Notes (Signed)
MRN: [id number] Name: Tricia Martin  Sex: female Age: 82 y.o. DOB: [birth year]  [place] #:  Facility/Room: [facility] [place]: SNF Provider: [name], MD Emergency Contacts: Extended Emergency Contact Information Primary Emergency Contact: Culclasure,[name]  [place] Phone: [phone] Relation: Other  Code Status: Full Code  Allergies: Ciprofloxacin hcl; Codeine; Duloxetine hcl; Hctz [hydrochlorothiazide]; Metaxalone; Morphine and related; Olmesartan; Penicillins; Quetiapine fumarate; and Zofran [ondansetron hcl]  Chief Complaint  Patient presents with  . Medical Management of Chronic Issues    Routine Visit    HPI: Patient is 82 y.o. female who is being seen for routine issues of dementia, HTN and GERD.  Past Medical History:  Diagnosis Date  . Hyperlipidemia   . Hypertension   . Hypothyroid   . OA (osteoarthritis)   . PE (pulmonary embolism)     Past Surgical History:  Procedure Laterality Date  . JOINT REPLACEMENT     B knees      Medication List       Accurate as of 02/14/16 11:59 PM. Always use your most recent med list.          calcium-vitamin D 500-200 MG-UNIT tablet Commonly known as:  OSCAL WITH D Take 1 tablet by mouth 2 (two) times daily.   DAILY VITE Tabs Take 1 tablet by mouth daily.   docusate sodium 100 MG capsule Commonly known as:  COLACE Take 100 mg by mouth 2 (two) times daily. Hold for loose stool   famotidine 20 MG tablet Commonly known as:  PEPCID Take 20 mg by mouth at bedtime. For reflux   fish oil-omega-3 fatty acids 1000 MG capsule Take 1 g by mouth 2 (two) times daily. For hyperlipidemia   levothyroxine 75 MCG tablet Commonly known as:  SYNTHROID, LEVOTHROID Take 75 mcg by mouth daily. For hypothyroidism   lisinopril 10 MG tablet Commonly known as:  PRINIVIL,ZESTRIL Take 5 mg by mouth daily. For HTN   memantine 10 MG tablet Commonly known as:  NAMENDA Take 10 mg by mouth 2  (two) times daily. For dementia   polyethylene glycol packet Commonly known as:  MIRALAX / GLYCOLAX Take 17 g by mouth daily.   rivaroxaban 20 MG Tabs tablet Commonly known as:  XARELTO Take 20 mg by mouth daily with supper.   sodium chloride 1 g tablet Take 1 g by mouth 2 (two) times daily. For sodium supplement   TYLENOL 8 HOUR ARTHRITIS PAIN 650 MG CR tablet Generic drug:  acetaminophen Take 650 mg by mouth 2 (two) times daily. For arthritis       Meds ordered this encounter  Medications  . Multiple Vitamin (DAILY VITE) TABS    Sig: Take 1 tablet by mouth daily.    Immunization History  Administered Date(s) Administered  . Influenza-Unspecified 04/26/2013, 05/20/2014, 04/21/2015    Social History  Substance Use Topics  . Smoking status: Never Smoker  . Smokeless tobacco: Never Used  . Alcohol use No    Review of Systems  DATA OBTAINED: from patient, nurse GENERAL:  no fevers, fatigue, appetite changes SKIN: No itching, rash HEENT: No complaint RESPIRATORY: No cough, wheezing, SOB CARDIAC: No chest pain, palpitations, lower extremity edema  GI: No abdominal pain, No N/V/D or constipation, No heartburn or reflux  GU: No dysuria, frequency or urgency, or incontinence  MUSCULOSKELETAL: No unrelieved bone/joint pain NEUROLOGIC: No headache, dizziness  PSYCHIATRIC: No overt anxiety or sadness  Vitals:   02/14/16 1223  BP: 128/70  Pulse: 74  Resp: 18  Temp: 97.4 F (36.3 C)    Physical Exam  GENERAL APPEARANCE: Alert, conversant, No acute distress  SKIN: No diaphoresis rash HEENT: Unremarkable RESPIRATORY: Breathing is even, unlabored. Lung sounds are clear   CARDIOVASCULAR: Heart RRR no murmurs, rubs or gallops. No peripheral edema  GASTROINTESTINAL: Abdomen is soft, non-tender, not distended w/ normal bowel sounds.  GENITOURINARY: Bladder non tender, not distended  MUSCULOSKELETAL: No abnormal joints or musculature NEUROLOGIC: Cranial nerves 2-12  grossly intact. Moves all extremities PSYCHIATRIC: Mood and affect appropriate to situation with dementia, no behavioral issues  Patient Active Problem List   Diagnosis Date Noted  . Fever 05/10/2014  . Nausea with vomiting 03/14/2014  . Mouth pain 11/17/2013  . Anxiety state 02/25/2013  . Lethargy 02/25/2013  . Osteoarthritis 02/04/2013  . Edema 02/04/2013  . Other convulsions 11/27/2012  . Unspecified constipation 11/27/2012  . Pulmonary embolus ([place]) 10/12/2012  . Long term current use of anticoagulant therapy 10/12/2012  . E. coli UTI 10/12/2012  . Essential hypertension, benign 10/12/2012  . Depression 10/12/2012  . Hypothyroidism 10/12/2012  . Dementia, multi-infarct 10/12/2012  . GERD (gastroesophageal reflux disease) 10/12/2012  . Psychosis 10/12/2012  . Hypokalemia 10/12/2012  . Hyposmolality and/or hyponatremia 10/12/2012    CBC    Component Value Date/Time   WBC 5.8 12/20/2015   WBC 4.3 04/04/2010 0915   RBC 4.28 04/04/2010 0915   HGB 14.1 12/20/2015   HCT 42 12/20/2015   PLT 254 12/20/2015   MCV 89.2 04/04/2010 0915   LYMPHSABS 0.9 04/04/2010 0915   MONOABS 0.4 04/04/2010 0915   EOSABS 0.0 04/04/2010 0915   BASOSABS 0.1 04/04/2010 0915    CMP     Component Value Date/Time   NA 146 11/01/2014   K 4.1 11/01/2014   CL 98 08/28/2010 1845   CO2 31 08/28/2010 1845   GLUCOSE 89 08/28/2010 1845   BUN 24 (A) 11/01/2014   CREATININE 1.0 11/01/2014   CREATININE 1.0 08/28/2010 1845   CALCIUM 9.6 08/28/2010 1845   AST 17 11/01/2014   ALT 10 11/01/2014   ALKPHOS 65 11/01/2014   GFRNONAA 54 (L) 08/28/2010 1845   GFRAA  08/28/2010 1845    >60        The eGFR has been calculated using the MDRD equation. This calculation has not been validated in all clinical situations. eGFR's persistently <60 mL/min signify possible Chronic Kidney Disease.    Assessment and Plan  Dementia, multi-infarct No reported problems or major declines. Plan to cont namenda  10 mg BID   Essential hypertension, benign No problems. Controlled on 5 mg daily. Will plan to continue namenda  GERD (gastroesophageal reflux disease) No reported problems of reflux or aspiration. Will plan to continue pepcid 20 mg daily.   [name], MD

## 2016-02-26 ENCOUNTER — Non-Acute Institutional Stay (SKILLED_NURSING_FACILITY): Payer: Medicare Other | Admitting: Internal Medicine

## 2016-02-26 DIAGNOSIS — N390 Urinary tract infection, site not specified: Secondary | ICD-10-CM

## 2016-02-26 DIAGNOSIS — B9620 Unspecified Escherichia coli [E. coli] as the cause of diseases classified elsewhere: Secondary | ICD-10-CM

## 2016-02-26 NOTE — Progress Notes (Signed)
MRN: [id number] Name: Tricia Martin  Sex: female Age: 82 y.o. DOB: 1930/10/31  [place] #:  Facility/Room: [facility] / [place]: SNF Provider: [name], MD Emergency Contacts: Extended Emergency Contact Information Primary Emergency Contact: Culclasure,[name]  [place] Phone: [phone] Relation: Other  Code Status: Full Code Allergies: Ciprofloxacin hcl; Codeine; Duloxetine hcl; Hctz [hydrochlorothiazide]; Metaxalone; Morphine and related; Olmesartan; Penicillins; Quetiapine fumarate; and Zofran [ondansetron hcl]  Chief Complaint  Patient presents with  . Acute Visit    Acute Visit    HPI: Patient is 82 y.o. female who is being seen for a UTI. Urine was ordered for c/o dysuria.pt has had no fever, no dec in activity or MS so are waiting for antibiotic sensitivities to return before treating.  Past Medical History:  Diagnosis Date  . Hyperlipidemia   . Hypertension   . Hypothyroid   . OA (osteoarthritis)   . PE (pulmonary embolism)     Past Surgical History:  Procedure Laterality Date  . JOINT REPLACEMENT     B knees      Medication List       Accurate as of 02/26/16 11:59 PM. Always use your most recent med list.          calcium-vitamin D 500-200 MG-UNIT tablet Commonly known as:  OSCAL WITH D Take 1 tablet by mouth 2 (two) times daily.   DAILY VITE Tabs Take 1 tablet by mouth daily.   docusate sodium 100 MG capsule Commonly known as:  COLACE Take 100 mg by mouth 2 (two) times daily. Hold for loose stool   famotidine 20 MG tablet Commonly known as:  PEPCID Take 20 mg by mouth at bedtime. For reflux   fish oil-omega-3 fatty acids 1000 MG capsule Take 1 g by mouth 2 (two) times daily. For hyperlipidemia   levothyroxine 75 MCG tablet Commonly known as:  SYNTHROID, LEVOTHROID Take 75 mcg by mouth daily. For hypothyroidism   lisinopril 10 MG tablet Commonly known as:  PRINIVIL,ZESTRIL Take 5 mg by mouth daily.  For HTN   memantine 10 MG tablet Commonly known as:  NAMENDA Take 10 mg by mouth 2 (two) times daily. For dementia   polyethylene glycol packet Commonly known as:  MIRALAX / GLYCOLAX Take 17 g by mouth daily.   rivaroxaban 20 MG Tabs tablet Commonly known as:  XARELTO Take 20 mg by mouth daily with supper.   sodium chloride 1 g tablet Take 1 g by mouth 2 (two) times daily. For sodium supplement   TYLENOL 8 HOUR ARTHRITIS PAIN 650 MG CR tablet Generic drug:  acetaminophen Take 650 mg by mouth 2 (two) times daily. For arthritis       No orders of the defined types were placed in this encounter.   Immunization History  Administered Date(s) Administered  . Influenza-Unspecified 04/26/2013, 05/20/2014, 04/21/2015    Social History  Substance Use Topics  . Smoking status: Never Smoker  . Smokeless tobacco: Never Used  . Alcohol use No    Review of Systems  DATA OBTAINED: from patient, nurse GENERAL:  no fevers, fatigue, appetite changes SKIN: No itching, rash HEENT: No complaint RESPIRATORY: No cough, wheezing, SOB CARDIAC: No chest pain, palpitations, lower extremity edema  GI: No abdominal pain, No N/V/D or constipation, No heartburn or reflux  GU: + dysuria,no  frequency or urgency, or incontinence  MUSCULOSKELETAL: No unrelieved bone/joint pain NEUROLOGIC: No headache, dizziness  PSYCHIATRIC: No overt anxiety or sadness  Vitals:  03/02/16 1331  BP: 128/70  Pulse: 74  Resp: 20  Temp: 97.4 F (36.3 C)    Physical Exam  GENERAL APPEARANCE: Alert, conversant, No acute distress  SKIN: No diaphoresis rash HEENT: Unremarkable RESPIRATORY: Breathing is even, unlabored. Lung sounds are clear   CARDIOVASCULAR: Heart RRR no murmurs, rubs or gallops. No peripheral edema  GASTROINTESTINAL: Abdomen is soft, non-tender, not distended w/ normal bowel sounds.  GENITOURINARY: Bladder non tender, not distended  MUSCULOSKELETAL: No abnormal joints or  musculature NEUROLOGIC: Cranial nerves 2-12 grossly intact. Moves all extremities PSYCHIATRIC: Mood and affect appropriate to situation, no behavioral issues  Patient Active Problem List   Diagnosis Date Noted  . Fever 05/10/2014  . Nausea with vomiting 03/14/2014  . Mouth pain 11/17/2013  . Anxiety state 02/25/2013  . Lethargy 02/25/2013  . Osteoarthritis 02/04/2013  . Edema 02/04/2013  . Other convulsions 11/27/2012  . Unspecified constipation 11/27/2012  . Pulmonary embolus ([place]) 10/12/2012  . Long term current use of anticoagulant therapy 10/12/2012  . E. coli UTI 10/12/2012  . Essential hypertension, benign 10/12/2012  . Depression 10/12/2012  . Hypothyroidism 10/12/2012  . Dementia, multi-infarct 10/12/2012  . GERD (gastroesophageal reflux disease) 10/12/2012  . Psychosis 10/12/2012  . Hypokalemia 10/12/2012  . Hyposmolality and/or hyponatremia 10/12/2012    CBC    Component Value Date/Time   WBC 5.8 12/20/2015   WBC 4.3 04/04/2010 0915   RBC 4.28 04/04/2010 0915   HGB 14.1 12/20/2015   HCT 42 12/20/2015   PLT 254 12/20/2015   MCV 89.2 04/04/2010 0915   LYMPHSABS 0.9 04/04/2010 0915   MONOABS 0.4 04/04/2010 0915   EOSABS 0.0 04/04/2010 0915   BASOSABS 0.1 04/04/2010 0915    CMP     Component Value Date/Time   NA 146 11/01/2014   K 4.1 11/01/2014   CL 98 08/28/2010 1845   CO2 31 08/28/2010 1845   GLUCOSE 89 08/28/2010 1845   BUN 24 (A) 11/01/2014   CREATININE 1.0 11/01/2014   CREATININE 1.0 08/28/2010 1845   CALCIUM 9.6 08/28/2010 1845   AST 17 11/01/2014   ALT 10 11/01/2014   ALKPHOS 65 11/01/2014   GFRNONAA 54 (L) 08/28/2010 1845   GFRAA  08/28/2010 1845    >60        The eGFR has been calculated using the MDRD equation. This calculation has not been validated in all clinical situations. eGFR's persistently <60 mL/min signify possible Chronic Kidney Disease.    Assessment and Plan  E. coli UTI >100,000 E Coli, pansensitive. Allergy to  Cipro, PCN; Cr 0.9, CrCl - 49.5; will tx with cefdinir 300 mg q 12 for 7 days   Time spent > 25 min;> 50% of time with patient was spent reviewing records, labs, tests and studies, counseling and developing plan of care  Webb Silversmith D. [name], MD

## 2016-03-02 ENCOUNTER — Encounter: Payer: Self-pay | Admitting: Internal Medicine

## 2016-03-02 NOTE — Assessment & Plan Note (Signed)
>  100,000 E Coli, pansensitive. Allergy to Cipro, PCN; Cr 0.9, CrCl - 49.5; will tx with cefdinir 300 mg q 12 for 7 days

## 2016-03-15 ENCOUNTER — Non-Acute Institutional Stay (SKILLED_NURSING_FACILITY): Payer: Medicare Other | Admitting: Internal Medicine

## 2016-03-15 ENCOUNTER — Encounter: Payer: Self-pay | Admitting: Internal Medicine

## 2016-03-15 DIAGNOSIS — E785 Hyperlipidemia, unspecified: Secondary | ICD-10-CM | POA: Diagnosis not present

## 2016-03-15 DIAGNOSIS — E038 Other specified hypothyroidism: Secondary | ICD-10-CM | POA: Diagnosis not present

## 2016-03-15 DIAGNOSIS — E871 Hypo-osmolality and hyponatremia: Secondary | ICD-10-CM | POA: Diagnosis not present

## 2016-03-15 DIAGNOSIS — E034 Atrophy of thyroid (acquired): Secondary | ICD-10-CM

## 2016-03-15 NOTE — Assessment & Plan Note (Addendum)
No reported problems or major declines. Plan to cont namenda 10 mg BID

## 2016-03-15 NOTE — Assessment & Plan Note (Signed)
No reported problems of reflux or aspiration. Will plan to continue pepcid 20 mg daily.

## 2016-03-15 NOTE — Progress Notes (Signed)
MRN: [id number] Name: Tricia Martin  Sex: female Age: 82 y.o. DOB: 1930-12-02  [place] #:  Facility/Room: [facility] / [place]: SNF Provider: [name], MD Emergency Contacts: Extended Emergency Contact Information Primary Emergency Contact: Culclasure,[name]  [place] Phone: [phone] Relation: Other  Code Status: Full Code  Allergies: Ciprofloxacin hcl; Codeine; Duloxetine hcl; Hctz [hydrochlorothiazide]; Metaxalone; Morphine and related; Olmesartan; Penicillins; Quetiapine fumarate; and Zofran [ondansetron hcl]  Chief Complaint  Patient presents with  . Medical Management of Chronic Issues    Routine Visit    HPI: Patient is 82 y.o. female who is being seen for routine issues of hypothyroidism,HLD and hyponatremia.   Past Medical History:  Diagnosis Date  . Hyperlipidemia   . Hypertension   . Hypothyroid   . OA (osteoarthritis)   . PE (pulmonary embolism)     Past Surgical History:  Procedure Laterality Date  . JOINT REPLACEMENT     B knees      Medication List       Accurate as of 03/15/16 11:59 PM. Always use your most recent med list.          calcium-vitamin D 500-200 MG-UNIT tablet Commonly known as:  OSCAL WITH D Take 1 tablet by mouth 2 (two) times daily.   DAILY VITE Tabs Take 1 tablet by mouth daily.   docusate sodium 100 MG capsule Commonly known as:  COLACE Take 100 mg by mouth 2 (two) times daily. Hold for loose stool   famotidine 20 MG tablet Commonly known as:  PEPCID Take 20 mg by mouth at bedtime. For reflux   fish oil-omega-3 fatty acids 1000 MG capsule Take 1 g by mouth 2 (two) times daily. For hyperlipidemia   levothyroxine 75 MCG tablet Commonly known as:  SYNTHROID, LEVOTHROID Take 75 mcg by mouth daily. For hypothyroidism   lisinopril 10 MG tablet Commonly known as:  PRINIVIL,ZESTRIL Take 5 mg by mouth daily. For HTN   memantine 10 MG tablet Commonly known as:  NAMENDA Take 10  mg by mouth 2 (two) times daily. For dementia   polyethylene glycol packet Commonly known as:  MIRALAX / GLYCOLAX Take 17 g by mouth daily.   rivaroxaban 20 MG Tabs tablet Commonly known as:  XARELTO Take 20 mg by mouth daily with supper.   sodium chloride 1 g tablet Take 1 g by mouth 2 (two) times daily. For sodium supplement   TYLENOL 8 HOUR ARTHRITIS PAIN 650 MG CR tablet Generic drug:  acetaminophen Take 650 mg by mouth 2 (two) times daily. For arthritis       No orders of the defined types were placed in this encounter.   Immunization History  Administered Date(s) Administered  . Influenza-Unspecified 04/26/2013, 05/20/2014, 04/21/2015    Social History  Substance Use Topics  . Smoking status: Never Smoker  . Smokeless tobacco: Never Used  . Alcohol use No    Review of Systems  DATA OBTAINED: from nurse GENERAL:  no fevers, fatigue, appetite changes SKIN: No itching, rash HEENT: No complaint RESPIRATORY: No cough, wheezing, SOB CARDIAC: No chest pain, palpitations, lower extremity edema  GI: No abdominal pain, No N/V/D or constipation, No heartburn or reflux  GU: No dysuria, frequency or urgency, or incontinence  MUSCULOSKELETAL: No unrelieved bone/joint pain NEUROLOGIC: No headache, dizziness  PSYCHIATRIC: No overt anxiety or sadness  Vitals:   03/15/16 0813  BP: 128/70  Pulse: 66  Resp: (!) 22  Temp: 97.3 F (36.3  C)    Physical Exam  GENERAL APPEARANCE: Alert, conversant, No acute distress  SKIN: No diaphoresis rash HEENT: Unremarkable RESPIRATORY: Breathing is even, unlabored. Lung sounds are clear   CARDIOVASCULAR: Heart RRR no murmurs, rubs or gallops. No peripheral edema  GASTROINTESTINAL: Abdomen is soft, non-tender, not distended w/ normal bowel sounds.  GENITOURINARY: Bladder non tender, not distended  MUSCULOSKELETAL: No abnormal joints or musculature NEUROLOGIC: Cranial nerves 2-12 grossly intact. Moves all extremities PSYCHIATRIC:  Mood and affect appropriate to situation WITH DEMENTIA, no behavioral issues  Patient Active Problem List   Diagnosis Date Noted  . Hyperlipidemia 03/17/2016  . Fever 05/10/2014  . Nausea with vomiting 03/14/2014  . Mouth pain 11/17/2013  . Anxiety state 02/25/2013  . Lethargy 02/25/2013  . Osteoarthritis 02/04/2013  . Edema 02/04/2013  . Other convulsions 11/27/2012  . Unspecified constipation 11/27/2012  . Pulmonary embolus ([place]) 10/12/2012  . Long term current use of anticoagulant therapy 10/12/2012  . E. coli UTI 10/12/2012  . Essential hypertension, benign 10/12/2012  . Depression 10/12/2012  . Hypothyroidism 10/12/2012  . Dementia, multi-infarct 10/12/2012  . GERD (gastroesophageal reflux disease) 10/12/2012  . Psychosis 10/12/2012  . Hypokalemia 10/12/2012  . Hyponatremia 10/12/2012    CBC    Component Value Date/Time   WBC 5.8 12/20/2015   WBC 4.3 04/04/2010 0915   RBC 4.28 04/04/2010 0915   HGB 14.1 12/20/2015   HCT 42 12/20/2015   PLT 254 12/20/2015   MCV 89.2 04/04/2010 0915   LYMPHSABS 0.9 04/04/2010 0915   MONOABS 0.4 04/04/2010 0915   EOSABS 0.0 04/04/2010 0915   BASOSABS 0.1 04/04/2010 0915    CMP     Component Value Date/Time   NA 146 11/01/2014   K 4.1 11/01/2014   CL 98 08/28/2010 1845   CO2 31 08/28/2010 1845   GLUCOSE 89 08/28/2010 1845   BUN 24 (A) 11/01/2014   CREATININE 1.0 11/01/2014   CREATININE 1.0 08/28/2010 1845   CALCIUM 9.6 08/28/2010 1845   AST 17 11/01/2014   ALT 10 11/01/2014   ALKPHOS 65 11/01/2014   GFRNONAA 54 (L) 08/28/2010 1845   GFRAA  08/28/2010 1845    >60        The eGFR has been calculated using the MDRD equation. This calculation has not been validated in all clinical situations. eGFR's persistently <60 mL/min signify possible Chronic Kidney Disease.    Assessment and Plan  HYPONATREMIA  Last Na+ was 142;Plan to cont sodium bicarb 1 gm BID  HLD In 10/2015 LDL 142 offset by the fact that HDL is 68;  pt is on fish oil daily, declines statins  HYPOTHYROIDISM TSH 1.88 in 4/20147; stable;plan to cont synthroid 75 mcg daily       Veneta Sliter D. [name], MD

## 2016-03-15 NOTE — Assessment & Plan Note (Signed)
No problems. Controlled on 5 mg daily. Will plan to continue namenda

## 2016-03-17 ENCOUNTER — Encounter: Payer: Self-pay | Admitting: Internal Medicine

## 2016-03-17 DIAGNOSIS — E785 Hyperlipidemia, unspecified: Secondary | ICD-10-CM | POA: Insufficient documentation

## 2016-03-17 NOTE — Assessment & Plan Note (Signed)
TSH 1.88 in 4/20147; stable;plan to cont synthroid 75 mcg daily

## 2016-03-17 NOTE — Assessment & Plan Note (Signed)
Most recent Na+ 142. Plan to cont NaBicarb 1 gm BID

## 2016-03-17 NOTE — Assessment & Plan Note (Signed)
In 10/2015 LDL 142 offset by the fact that HDL is 68; pt is on fish oil daily, declines statins

## 2016-03-19 ENCOUNTER — Encounter: Payer: Self-pay | Admitting: Internal Medicine

## 2016-03-19 ENCOUNTER — Non-Acute Institutional Stay (SKILLED_NURSING_FACILITY): Payer: Medicare Other | Admitting: Internal Medicine

## 2016-03-19 DIAGNOSIS — B3731 Acute candidiasis of vulva and vagina: Secondary | ICD-10-CM

## 2016-03-19 DIAGNOSIS — B373 Candidiasis of vulva and vagina: Secondary | ICD-10-CM

## 2016-03-19 DIAGNOSIS — R4689 Other symptoms and signs involving appearance and behavior: Secondary | ICD-10-CM

## 2016-03-19 DIAGNOSIS — F6089 Other specific personality disorders: Secondary | ICD-10-CM | POA: Diagnosis not present
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
## 2016-03-20 ENCOUNTER — Encounter: Payer: Self-pay | Admitting: Internal Medicine

## 2016-04-02 LAB — CBC AND DIFFERENTIAL
HCT: 42 % (ref 36–46)
HEMOGLOBIN: 14.1 g/dL (ref 12.0–16.0)
Platelets: 268 10*3/uL (ref 150–399)
WBC: 4.8 10*3/mL

## 2016-04-02 LAB — LIPID PANEL
CHOLESTEROL: 220 mg/dL — AB (ref 0–200)
HDL: 92 mg/dL — AB (ref 35–70)
LDL Cholesterol: 116 mg/dL
Triglycerides: 61 mg/dL (ref 40–160)

## 2016-04-02 LAB — BASIC METABOLIC PANEL
BUN: 19 mg/dL (ref 4–21)
Creatinine: 1 mg/dL (ref 0.5–1.1)
Glucose: 133 mg/dL
POTASSIUM: 4.7 mmol/L (ref 3.4–5.3)
SODIUM: 130 mmol/L — AB (ref 137–147)

## 2016-04-02 LAB — HEPATIC FUNCTION PANEL
ALT: 12 U/L (ref 7–35)
AST: 22 U/L (ref 13–35)
Alkaline Phosphatase: 67 U/L (ref 25–125)
Bilirubin, Total: 0.3 mg/dL

## 2016-04-02 LAB — HEMOGLOBIN A1C: Hemoglobin A1C: 5.5

## 2016-04-02 LAB — TSH: TSH: 1.47 u[IU]/mL (ref 0.41–5.90)

## 2016-04-12 ENCOUNTER — Non-Acute Institutional Stay (SKILLED_NURSING_FACILITY): Payer: Medicare Other | Admitting: Internal Medicine

## 2016-04-12 DIAGNOSIS — I2782 Chronic pulmonary embolism: Secondary | ICD-10-CM | POA: Diagnosis not present

## 2016-04-12 DIAGNOSIS — F329 Major depressive disorder, single episode, unspecified: Secondary | ICD-10-CM | POA: Diagnosis not present

## 2016-04-12 DIAGNOSIS — N183 Chronic kidney disease, stage 3 unspecified: Secondary | ICD-10-CM

## 2016-04-12 DIAGNOSIS — F32A Depression, unspecified: Secondary | ICD-10-CM

## 2016-04-14 ENCOUNTER — Encounter: Payer: Self-pay | Admitting: Internal Medicine

## 2016-04-14 DIAGNOSIS — N182 Chronic kidney disease, stage 2 (mild): Secondary | ICD-10-CM | POA: Insufficient documentation

## 2016-04-14 DIAGNOSIS — N183 Chronic kidney disease, stage 3 unspecified: Secondary | ICD-10-CM

## 2016-04-14 HISTORY — DX: Chronic kidney disease, stage 3 unspecified: N18.30

## 2016-04-14 NOTE — Assessment & Plan Note (Signed)
In 03/2016 GFR is 55; BUN/Cr  19/0.96; decent for her age, will monitor at intervals

## 2016-04-14 NOTE — Assessment & Plan Note (Signed)
Stable; plan to cont xarelto 20 mg daily

## 2016-04-14 NOTE — Assessment & Plan Note (Signed)
Chronic, stable; plan to cont effexore 37.5 mg daily

## 2016-04-14 NOTE — Progress Notes (Signed)
MRN: [id number] Name: Tricia Martin  Sex: female Age: 82 y.o. DOB: [birth year]  [facility] #:  Facility/Room:[facility]: SNF Provider: Inocencio Homes Emergency Contacts: Extended Emergency Contact Information Primary Emergency Contact: Culclasure,[name]  [place] Phone: [phone] Relation: Other  Code Status:   Allergies: Ciprofloxacin hcl; Codeine; Duloxetine hcl; Hctz [hydrochlorothiazide]; Metaxalone; Morphine and related; Olmesartan; Penicillins; Quetiapine fumarate; and Zofran [ondansetron hcl]  Chief Complaint  Patient presents with  . Medical Management of Chronic Issues    HPI: Patient is 82 y.o. female who is being seen today for routine issues of PE, depression and CKD3.  Past Medical History:  Diagnosis Date  . Hyperlipidemia   . Hypertension   . Hypothyroid   . OA (osteoarthritis)   . PE (pulmonary embolism)     Past Surgical History:  Procedure Laterality Date  . JOINT REPLACEMENT     B knees      Medication List       Accurate as of 04/12/16 11:59 PM. Always use your most recent med list.          calcium-vitamin D 500-200 MG-UNIT tablet Commonly known as:  OSCAL WITH D Take 1 tablet by mouth 2 (two) times daily.   DAILY VITE Tabs Take 1 tablet by mouth daily.   docusate sodium 100 MG capsule Commonly known as:  COLACE Take 100 mg by mouth 2 (two) times daily. Hold for loose stool   famotidine 20 MG tablet Commonly known as:  PEPCID Take 20 mg by mouth at bedtime. For reflux   fish oil-omega-3 fatty acids 1000 MG capsule Take 1 g by mouth 2 (two) times daily. For hyperlipidemia   levothyroxine 75 MCG tablet Commonly known as:  SYNTHROID, LEVOTHROID Take 75 mcg by mouth daily. For hypothyroidism   lisinopril 5 MG tablet Commonly known as:  PRINIVIL,ZESTRIL Take 5 mg by mouth daily.   memantine 10 MG tablet Commonly known as:  NAMENDA Take 10 mg by mouth 2 (two) times daily. For dementia    polyethylene glycol packet Commonly known as:  MIRALAX / GLYCOLAX Take 17 g by mouth daily. HOLD FOR LOOSE STOOLS   rivaroxaban 20 MG Tabs tablet Commonly known as:  XARELTO Take 20 mg by mouth daily with supper.   sodium chloride 1 g tablet Take 1 g by mouth 2 (two) times daily. For sodium supplement   TYLENOL 8 HOUR ARTHRITIS PAIN 650 MG CR tablet Generic drug:  acetaminophen Take 650 mg by mouth every 12 (twelve) hours. For arthritis   venlafaxine XR 37.5 MG 24 hr capsule Commonly known as:  EFFEXOR-XR Take 37.5 mg by mouth daily with breakfast.       No orders of the defined types were placed in this encounter.   Immunization History  Administered Date(s) Administered  . Influenza-Unspecified 04/26/2013, 05/20/2014, 04/21/2015    Social History  Substance Use Topics  . Smoking status: Never Smoker  . Smokeless tobacco: Never Used  . Alcohol use No    Review of Systems  DATA OBTAINED: from patient, nurse GENERAL:  no fevers, fatigue, appetite changes SKIN: No itching, rash HEENT: No complaint RESPIRATORY: No cough, wheezing, SOB CARDIAC: No chest pain, palpitations, lower extremity edema  GI: No abdominal pain, No N/V/D or constipation, No heartburn or reflux  GU: No dysuria, frequency or urgency, or incontinence  MUSCULOSKELETAL: No unrelieved bone/joint pain NEUROLOGIC: No headache, dizziness  PSYCHIATRIC: No overt anxiety or sadness  Vitals:   04/15/16 1017  BP: 128/70  Pulse: 78  Resp: (!) 22  Temp: 97.3 F (36.3 C)    Physical Exam  GENERAL APPEARANCE: Alert, conversant, No acute distress  SKIN: No diaphoresis rash HEENT: Unremarkable RESPIRATORY: Breathing is even, unlabored. Lung sounds are clear   CARDIOVASCULAR: Heart RRR no murmurs, rubs or gallops. No peripheral edema  GASTROINTESTINAL: Abdomen is soft, non-tender, not distended w/ normal bowel sounds.  GENITOURINARY: Bladder non tender, not distended  MUSCULOSKELETAL: No abnormal  joints or musculature NEUROLOGIC: Cranial nerves 2-12 grossly intact. Moves all extremities PSYCHIATRIC: Mood and affect appropriate to situation, no behavioral issues  Patient Active Problem List   Diagnosis Date Noted  . CKD (chronic kidney disease) stage 3, GFR 30-59 ml/min 04/14/2016  . Hyperlipidemia 03/17/2016  . Fever 05/10/2014  . Nausea with vomiting 03/14/2014  . Mouth pain 11/17/2013  . Anxiety state 02/25/2013  . Lethargy 02/25/2013  . Osteoarthritis 02/04/2013  . Edema 02/04/2013  . Other convulsions 11/27/2012  . Unspecified constipation 11/27/2012  . Pulmonary embolus ([place]) 10/12/2012  . Long term current use of anticoagulant therapy 10/12/2012  . E. coli UTI 10/12/2012  . Essential hypertension, benign 10/12/2012  . Depression 10/12/2012  . Hypothyroidism 10/12/2012  . Dementia, multi-infarct 10/12/2012  . GERD (gastroesophageal reflux disease) 10/12/2012  . Psychosis 10/12/2012  . Hypokalemia 10/12/2012  . Hyponatremia 10/12/2012    CBC    Component Value Date/Time   WBC 5.8 12/20/2015   WBC 4.3 04/04/2010 0915   RBC 4.28 04/04/2010 0915   HGB 14.1 12/20/2015   HCT 42 12/20/2015   PLT 254 12/20/2015   MCV 89.2 04/04/2010 0915   LYMPHSABS 0.9 04/04/2010 0915   MONOABS 0.4 04/04/2010 0915   EOSABS 0.0 04/04/2010 0915   BASOSABS 0.1 04/04/2010 0915    CMP     Component Value Date/Time   NA 146 11/01/2014   K 4.1 11/01/2014   CL 98 08/28/2010 1845   CO2 31 08/28/2010 1845   GLUCOSE 89 08/28/2010 1845   BUN 24 (A) 11/01/2014   CREATININE 1.0 11/01/2014   CREATININE 1.0 08/28/2010 1845   CALCIUM 9.6 08/28/2010 1845   AST 17 11/01/2014   ALT 10 11/01/2014   ALKPHOS 65 11/01/2014   GFRNONAA 54 (L) 08/28/2010 1845   GFRAA  08/28/2010 1845    >60        The eGFR has been calculated using the MDRD equation. This calculation has not been validated in all clinical situations. eGFR's persistently <60 mL/min signify possible Chronic Kidney  Disease.    Assessment and Plan  Pulmonary embolus Stable; plan to cont xarelto 20 mg daily  Depression Chronic, stable; plan to cont effexore 37.5 mg daily  CKD (chronic kidney disease) stage 3, GFR 30-59 ml/min In 03/2016 GFR is 55; BUN/Cr  19/0.96; decent for her age, will monitor at intervals   Inocencio Homes, MD

## 2016-04-15 ENCOUNTER — Encounter: Payer: Self-pay | Admitting: Internal Medicine

## 2016-04-30 ENCOUNTER — Non-Acute Institutional Stay (SKILLED_NURSING_FACILITY): Payer: Medicare Other | Admitting: Internal Medicine

## 2016-04-30 ENCOUNTER — Encounter: Payer: Self-pay | Admitting: Internal Medicine

## 2016-04-30 DIAGNOSIS — K134 Granuloma and granuloma-like lesions of oral mucosa: Secondary | ICD-10-CM

## 2016-04-30 NOTE — Progress Notes (Signed)
Patient ID: Tricia Martin, female   DOB: 07-18-1931, 82 y.o.   MRN: [id number] : Provider:  Inocencio Homes, MD Location:  [place] [facility] [place] Room Number: [place] of Service:  SNF [phone])  PCP: No primary care provider on file. No care team member to display  Extended Emergency Contact Information Primary Emergency Contact: Culclasure,[name]  [place] Phone: [phone] Relation: Other     Allergies: Ciprofloxacin hcl; Codeine; Duloxetine hcl; Hctz [hydrochlorothiazide]; Metaxalone; Morphine and related; Olmesartan; Penicillins; Quetiapine fumarate; and Zofran [ondansetron hcl]  Chief Complaint  Patient presents with  . Acute Visit    HPI: Patient is 82 y.o. female who nursing asked me to see for a sore mouth. Pt says onset maybe a month or so ago, worse in the past weeks. Hurts the most when she is eating. Denies any bleeding.  Past Medical History:  Diagnosis Date  . Hyperlipidemia   . Hypertension   . Hypothyroid   . OA (osteoarthritis)   . PE (pulmonary embolism)     Past Surgical History:  Procedure Laterality Date  . JOINT REPLACEMENT     B knees      Medication List       Accurate as of 04/30/16  2:32 PM. Always use your most recent med list.          calcium-vitamin D 500-200 MG-UNIT tablet Commonly known as:  OSCAL WITH D Take 1 tablet by mouth 2 (two) times daily.   DAILY VITE Tabs Take 1 tablet by mouth daily.   docusate sodium 100 MG capsule Commonly known as:  COLACE Take 100 mg by mouth 2 (two) times daily. Hold for loose stool   famotidine 20 MG tablet Commonly known as:  PEPCID Take 20 mg by mouth at bedtime. For reflux   fish oil-omega-3 fatty acids 1000 MG capsule Take 1 g by mouth 2 (two) times daily. For hyperlipidemia   levothyroxine 75 MCG tablet Commonly known as:  SYNTHROID, LEVOTHROID Take 75 mcg by mouth daily. For hypothyroidism   lisinopril 5 MG tablet Commonly known as:   PRINIVIL,ZESTRIL Take 5 mg by mouth daily.   memantine 10 MG tablet Commonly known as:  NAMENDA Take 10 mg by mouth 2 (two) times daily. For dementia   polyethylene glycol packet Commonly known as:  MIRALAX / GLYCOLAX Take 17 g by mouth daily. HOLD FOR LOOSE STOOLS   rivaroxaban 20 MG Tabs tablet Commonly known as:  XARELTO Take 20 mg by mouth daily with supper.   sodium chloride 1 g tablet Take 1 g by mouth 2 (two) times daily. For sodium supplement   TYLENOL 8 HOUR ARTHRITIS PAIN 650 MG CR tablet Generic drug:  acetaminophen Take 650 mg by mouth every 12 (twelve) hours. For arthritis   venlafaxine XR 37.5 MG 24 hr capsule Commonly known as:  EFFEXOR-XR Take 37.5 mg by mouth daily with breakfast.       No orders of the defined types were placed in this encounter.   Immunization History  Administered Date(s) Administered  . Influenza-Unspecified 04/26/2013, 05/20/2014, 04/21/2015    Social History  Substance Use Topics  . Smoking status: Never Smoker  . Smokeless tobacco: Never Used  . Alcohol use No    Family history is   No family history on file.    Review of Systems  DATA OBTAINED: from patient, nurse GENERAL:  no fevers, fatigue, appetite changes SKIN: No itching, or rash EYES: No eye  pain, redness, discharge EARS: No earache, tinnitus, change in hearing NOSE: No congestion, drainage or bleeding  MOUTH/THROAT: pain inside upper lip,no  tooth pain, No sore throat RESPIRATORY: No cough, wheezing, SOB CARDIAC: No chest pain, palpitations, lower extremity edema  GI: No abdominal pain, No N/V/D or constipation, No heartburn or reflux  GU: No dysuria, frequency or urgency, or incontinence  MUSCULOSKELETAL: No unrelieved bone/joint pain NEUROLOGIC: No headache, dizziness or focal weakness PSYCHIATRIC: No c/o anxiety or sadness   Vitals:   04/30/16 1422  BP: 128/70  Pulse: 80  Resp: (!) 22  Temp: 97.3 F (36.3 C)    SpO2 Readings from Last 1  Encounters:  No data found for SpO2   Body mass index is 32.28 kg/m.     Physical Exam  GENERAL APPEARANCE: Alert, conversant,  No acute distress.  SKIN: No diaphoresis rash HEAD: Normocephalic, atraumatic  EYES: Conjunctiva/lids clear. Pupils round, reactive. EOMs intact.  EARS: External exam WNL, canals clear. Hearing grossly normal.  NOSE: No deformity or discharge.  MOUTH/THROAT: soft tissue growth in alveolar mucosa above front teeth, where dentures are fitted against mucodsa of upper lip RESPIRATORY: Breathing is even, unlabored. Lung sounds are clear   CARDIOVASCULAR: Heart RRR no murmurs, rubs or gallops. No peripheral edema.   GASTROINTESTINAL: Abdomen is soft, non-tender, not distended w/ normal bowel sounds. GENITOURINARY: Bladder non tender, not distended  MUSCULOSKELETAL: No abnormal joints or musculature NEUROLOGIC:  Cranial nerves 2-12 grossly intact. Moves all extremities  PSYCHIATRIC: Mood and affect appropriate to situation, no behavioral issues  Patient Active Problem List   Diagnosis Date Noted  . CKD (chronic kidney disease) stage 3, GFR 30-59 ml/min 04/14/2016  . Hyperlipidemia 03/17/2016  . Fever 05/10/2014  . Nausea with vomiting 03/14/2014  . Mouth pain 11/17/2013  . Anxiety state 02/25/2013  . Lethargy 02/25/2013  . Osteoarthritis 02/04/2013  . Edema 02/04/2013  . Other convulsions 11/27/2012  . Unspecified constipation 11/27/2012  . Pulmonary embolus ([place]) 10/12/2012  . Long term current use of anticoagulant therapy 10/12/2012  . E. coli UTI 10/12/2012  . Essential hypertension, benign 10/12/2012  . Depression 10/12/2012  . Hypothyroidism 10/12/2012  . Dementia, multi-infarct 10/12/2012  . GERD (gastroesophageal reflux disease) 10/12/2012  . Psychosis 10/12/2012  . Hypokalemia 10/12/2012  . Hyponatremia 10/12/2012      Labs reviewed: Basic Metabolic Panel:    Component Value Date/Time   NA 130 (A) 04/02/2016   K 4.7 04/02/2016    CL 98 08/28/2010 1845   CO2 31 08/28/2010 1845   GLUCOSE 89 08/28/2010 1845   BUN 19 04/02/2016   CREATININE 1.0 04/02/2016   CREATININE 1.0 08/28/2010 1845   CALCIUM 9.6 08/28/2010 1845   AST 22 04/02/2016   ALT 12 04/02/2016   ALKPHOS 67 04/02/2016   GFRNONAA 54 (L) 08/28/2010 1845   GFRAA  08/28/2010 1845    >60        The eGFR has been calculated using the MDRD equation. This calculation has not been validated in all clinical situations. eGFR's persistently <60 mL/min signify possible Chronic Kidney Disease.     Recent Labs  04/02/16  NA 130*  K 4.7  BUN 19  CREATININE 1.0   Liver Function Tests:  Recent Labs  04/02/16  AST 22  ALT 12  ALKPHOS 67   No results for input(s): LIPASE, AMYLASE in the last 8760 hours. No results for input(s): AMMONIA in the last 8760 hours. CBC:  Recent Labs  12/20/15 04/02/16  WBC  5.8 4.8  HGB 14.1 14.1  HCT 42 42  PLT 254 268   Lipid  Recent Labs  11/03/15 04/02/16  CHOL 224* 220*  HDL 68 92*  LDLCALC 142 116  TRIG 72 61    Cardiac Enzymes: No results for input(s): CKTOTAL, CKMB, CKMBINDEX, TROPONINI in the last 8760 hours. BNP: No results for input(s): BNP in the last 8760 hours. No results found for: [facility] Lab Results  Component Value Date   HGBA1C 5.5 04/02/2016   Lab Results  Component Value Date   TSH 1.47 04/02/2016   No results found for: VITAMINB12 No results found for: FOLATE No results found for: IRON, TIBC, FERRITIN  Imaging and Procedures obtained prior to SNF admission: US Abdomen Complete  Result Date: 08/28/2010 *RADIOLOGY REPORT* Clinical Data:  The ABDOMINAL ULTRASOUND COMPLETE Comparison:  None. Findings: Gallbladder:  No gallstones, gallbladder wall thickening, or pericholecystic fluid. Common Bile Duct:  Within normal limits in caliber.1.2 mm. Liver: No focal mass lesion identified.  Within normal limits in parenchymal echogenicity. IVC:  Limited visualization due to bowel gas.  Pancreas:  Limited visualization due to bowel gas.Visualized portion shows no abnormality. Spleen:  Within normal limits in size and echotexture. Right kidney:  Normal in size and parenchymal echogenicity.  No evidence of mass or hydronephrosis. Left kidney:  Normal in size and parenchymal echogenicity.  No evidence of mass or hydronephrosis. Abdominal Aorta:  No aneurysm identified. No ascites is identified. IMPRESSION: Negative abdominal ultrasound. Evaluation of pancreas and inferior vena cava is limited by bowel gas. Original Report Authenticated By: Curlene Dolphin, M.D.    Not all labs, radiology exams or other studies done during hospitalization come through on my EPIC note; however they are reviewed by me.    Assessment and Plan  GROWTH IN MOUTH Gustine- explained to pt and nurse what I thought it was; needs to ne set up with dentist; if they don't feel like theycan remove it (which they probably won't) then pt will need to see oral surgeon or ENT for removal.  No problem-specific Assessment & Plan notes found for this encounter.   Despina Hidden, MD

## 2016-05-07 ENCOUNTER — Encounter: Payer: Self-pay | Admitting: Internal Medicine

## 2016-05-07 ENCOUNTER — Non-Acute Institutional Stay (SKILLED_NURSING_FACILITY): Payer: Medicare Other | Admitting: Internal Medicine

## 2016-05-07 DIAGNOSIS — E034 Atrophy of thyroid (acquired): Secondary | ICD-10-CM | POA: Diagnosis not present

## 2016-05-07 DIAGNOSIS — K219 Gastro-esophageal reflux disease without esophagitis: Secondary | ICD-10-CM | POA: Diagnosis not present

## 2016-05-07 DIAGNOSIS — F0150 Vascular dementia without behavioral disturbance: Secondary | ICD-10-CM

## 2016-05-07 NOTE — Progress Notes (Signed)
Location:  [place] Room Number: [phone] Place of Service:  SNF [phone])  Tricia Martin. [name], MD     Extended Emergency Contact Information Primary Emergency Contact: Culclasure,[name]  [place] Phone: [phone] Relation: Other    Allergies: Ciprofloxacin hcl; Codeine; Duloxetine hcl; Hctz [hydrochlorothiazide]; Metaxalone; Morphine and related; Olmesartan; Penicillins; Quetiapine fumarate; and Zofran [ondansetron hcl]  Chief Complaint  Patient presents with  . Medical Management of Chronic Issues    Routine Visit    HPI: Patient is 82 y.o. female who is being seen for routine issues of hypothyroidism, GERD and dementia.  Past Medical History:  Diagnosis Date  . Hyperlipidemia   . Hypertension   . Hypothyroid   . OA (osteoarthritis)   . PE (pulmonary embolism)     Past Surgical History:  Procedure Laterality Date  . JOINT REPLACEMENT     B knees      Medication List       Accurate as of 05/07/16 11:59 PM. Always use your most recent med list.          calcium-vitamin D 500-200 MG-UNIT tablet Commonly known as:  OSCAL WITH D Take 1 tablet by mouth 2 (two) times daily.   DAILY VITE Tabs Take 1 tablet by mouth daily.   docusate sodium 100 MG capsule Commonly known as:  COLACE Take 100 mg by mouth 2 (two) times daily. Hold for loose stool   famotidine 20 MG tablet Commonly known as:  PEPCID Take 20 mg by mouth at bedtime. For reflux   fish oil-omega-3 fatty acids 1000 MG capsule Take 1 g by mouth 2 (two) times daily. For hyperlipidemia   levothyroxine 75 MCG tablet Commonly known as:  SYNTHROID, LEVOTHROID Take 75 mcg by mouth daily. For hypothyroidism   lisinopril 5 MG tablet Commonly known as:  PRINIVIL,ZESTRIL Take 5 mg by mouth daily.   memantine 10 MG tablet Commonly known as:  NAMENDA Take 10 mg by mouth 2 (two) times daily. For dementia   polyethylene glycol packet Commonly known as:   MIRALAX / GLYCOLAX Take 17 g by mouth daily. HOLD FOR LOOSE STOOLS   rivaroxaban 20 MG Tabs tablet Commonly known as:  XARELTO Take 20 mg by mouth daily with supper.   sodium chloride 1 g tablet Take 1 g by mouth 2 (two) times daily. For sodium supplement   TYLENOL 8 HOUR ARTHRITIS PAIN 650 MG CR tablet Generic drug:  acetaminophen Take 650 mg by mouth every 12 (twelve) hours. For arthritis   venlafaxine XR 37.5 MG 24 hr capsule Commonly known as:  EFFEXOR-XR Take 37.5 mg by mouth daily with breakfast.       No orders of the defined types were placed in this encounter.   Immunization History  Administered Date(s) Administered  . Influenza-Unspecified 04/26/2013, 05/20/2014, 04/21/2015    Social History  Substance Use Topics  . Smoking status: Never Smoker  . Smokeless tobacco: Never Used  . Alcohol use No    Review of Systems  DATA OBTAINED: from patient, nurse GENERAL:  no fevers, fatigue, appetite changes SKIN: No itching, rash HEENT: No complaint RESPIRATORY: No cough, wheezing, SOB CARDIAC: No chest pain, palpitations, lower extremity edema  GI: No abdominal pain, No N/V/D or constipation, No heartburn or reflux  GU: No dysuria, frequency or urgency, or incontinence  MUSCULOSKELETAL: No unrelieved bone/joint pain NEUROLOGIC: No headache, dizziness  PSYCHIATRIC: No overt anxiety or sadness  Vitals:   05/07/16 5697  BP: 128/70  Pulse: 64  Resp: (!) 22  Temp: 97.3 F (36.3 C)   Body mass index is 31.83 kg/m. Physical Exam  GENERAL APPEARANCE: Alert, conversant, No acute distress  SKIN: No diaphoresis rash HEENT: Unremarkable RESPIRATORY: Breathing is even, unlabored. Lung sounds are clear   CARDIOVASCULAR: Heart RRR no murmurs, rubs or gallops. No peripheral edema  GASTROINTESTINAL: Abdomen is soft, non-tender, not distended w/ normal bowel sounds.  GENITOURINARY: Bladder non tender, not distended  MUSCULOSKELETAL: No abnormal joints or  musculature NEUROLOGIC: Cranial nerves 2-12 grossly intact. Moves all extremities PSYCHIATRIC: Mood and affect appropriate to situation, no behavioral issues  Patient Active Problem List   Diagnosis Date Noted  . CKD (chronic kidney disease) stage 3, GFR 30-59 ml/min 04/14/2016  . Hyperlipidemia 03/17/2016  . Fever 05/10/2014  . Nausea with vomiting 03/14/2014  . Mouth pain 11/17/2013  . Anxiety state 02/25/2013  . Lethargy 02/25/2013  . Osteoarthritis 02/04/2013  . Edema 02/04/2013  . Other convulsions 11/27/2012  . Unspecified constipation 11/27/2012  . Pulmonary embolus ([place]) 10/12/2012  . Long term current use of anticoagulant therapy 10/12/2012  . E. coli UTI 10/12/2012  . Essential hypertension, benign 10/12/2012  . Depression 10/12/2012  . Hypothyroidism 10/12/2012  . Dementia, multi-infarct 10/12/2012  . GERD (gastroesophageal reflux disease) 10/12/2012  . Psychosis 10/12/2012  . Hypokalemia 10/12/2012  . Hyponatremia 10/12/2012    CMP     Component Value Date/Time   NA 130 (A) 04/02/2016   K 4.7 04/02/2016   CL 98 08/28/2010 1845   CO2 31 08/28/2010 1845   GLUCOSE 89 08/28/2010 1845   BUN 19 04/02/2016   CREATININE 1.0 04/02/2016   CREATININE 1.0 08/28/2010 1845   CALCIUM 9.6 08/28/2010 1845   AST 22 04/02/2016   ALT 12 04/02/2016   ALKPHOS 67 04/02/2016   GFRNONAA 54 (L) 08/28/2010 1845   GFRAA  08/28/2010 1845    >60        The eGFR has been calculated using the MDRD equation. This calculation has not been validated in all clinical situations. eGFR's persistently <60 mL/min signify possible Chronic Kidney Disease.    Recent Labs  04/02/16  NA 130*  K 4.7  BUN 19  CREATININE 1.0    Recent Labs  04/02/16  AST 22  ALT 12  ALKPHOS 67    Recent Labs  12/20/15 04/02/16  WBC 5.8 4.8  HGB 14.1 14.1  HCT 42 42  PLT 254 268    Recent Labs  11/03/15 04/02/16  CHOL 224* 220*  LDLCALC 142 116  TRIG 72 61   No results found for:  [facility] Lab Results  Component Value Date   TSH 1.47 04/02/2016   Lab Results  Component Value Date   HGBA1C 5.5 04/02/2016   Lab Results  Component Value Date   CHOL 220 (A) 04/02/2016   HDL 92 (A) 04/02/2016   LDLCALC 116 04/02/2016   TRIG 61 04/02/2016    Significant Diagnostic Results in last 30 days:  No results found.  Assessment and Plan  Hypothyroidism Tsh1.47 IN 03/2016;PLAN TO CONT SYNTHROID 75 MCG DAILY  GERD (gastroesophageal reflux disease) Pt continues on pepcid 20 mg daily with no reports of reflux  Dementia, multi-infarct Mild to moderate; plan to cont namenda 10 mg BID     Tricia Martin. [name], MD

## 2016-05-09 ENCOUNTER — Encounter: Payer: Self-pay | Admitting: Internal Medicine

## 2016-05-09 ENCOUNTER — Non-Acute Institutional Stay (SKILLED_NURSING_FACILITY): Payer: Medicare Other | Admitting: Internal Medicine

## 2016-05-09 DIAGNOSIS — M25561 Pain in right knee: Secondary | ICD-10-CM | POA: Diagnosis not present

## 2016-05-09 NOTE — Progress Notes (Signed)
Location:  [place] Room Number: [phone] Place of Service:  SNF [phone])  Tricia Martin. [name], MD  No care team member to display  Extended Emergency Contact Information Primary Emergency Contact: Martin,Tricia  [place] Phone: [phone] Relation: Other    Allergies: Ciprofloxacin hcl; Codeine; Duloxetine hcl; Hctz [hydrochlorothiazide]; Metaxalone; Morphine and related; Olmesartan; Penicillins; Quetiapine fumarate; and Zofran [ondansetron hcl]  Chief Complaint  Patient presents with  . Acute Visit    Acute    HPI: Patient is 82 y.o. female who came up to me in the hall and asked if I could see her. She c/o R knee, sudden in onset, went to bed one night fine and next am hurt as soon as she put weight on it. She denies any trauma any time. She has had surgery on R knee prior, doesn't know what kind.  Past Medical History:  Diagnosis Date  . Hyperlipidemia   . Hypertension   . Hypothyroid   . OA (osteoarthritis)   . PE (pulmonary embolism)     Past Surgical History:  Procedure Laterality Date  . JOINT REPLACEMENT     B knees      Medication List       Accurate as of 05/09/16  1:47 PM. Always use your most recent med list.          calcium-vitamin D 500-200 MG-UNIT tablet Commonly known as:  OSCAL WITH D Take 1 tablet by mouth 2 (two) times daily.   DAILY VITE Tabs Take 1 tablet by mouth daily.   docusate sodium 100 MG capsule Commonly known as:  COLACE Take 100 mg by mouth 2 (two) times daily. Hold for loose stool   famotidine 20 MG tablet Commonly known as:  PEPCID Take 20 mg by mouth at bedtime. For reflux   fish oil-omega-3 fatty acids 1000 MG capsule Take 1 g by mouth 2 (two) times daily. For hyperlipidemia   levothyroxine 75 MCG tablet Commonly known as:  SYNTHROID, LEVOTHROID Take 75 mcg by mouth daily. For hypothyroidism   lisinopril 5 MG tablet Commonly known as:  PRINIVIL,ZESTRIL Take  5 mg by mouth daily.   memantine 10 MG tablet Commonly known as:  NAMENDA Take 10 mg by mouth 2 (two) times daily. For dementia   polyethylene glycol packet Commonly known as:  MIRALAX / GLYCOLAX Take 17 g by mouth daily. HOLD FOR LOOSE STOOLS   rivaroxaban 20 MG Tabs tablet Commonly known as:  XARELTO Take 20 mg by mouth daily with supper.   sodium chloride 1 g tablet Take 1 g by mouth 2 (two) times daily. For sodium supplement   TYLENOL 8 HOUR ARTHRITIS PAIN 650 MG CR tablet Generic drug:  acetaminophen Take 650 mg by mouth every 12 (twelve) hours. For arthritis   venlafaxine XR 37.5 MG 24 hr capsule Commonly known as:  EFFEXOR-XR Take 37.5 mg by mouth daily with breakfast.       No orders of the defined types were placed in this encounter.   Immunization History  Administered Date(s) Administered  . Influenza-Unspecified 04/26/2013, 05/20/2014, 04/21/2015    Social History  Substance Use Topics  . Smoking status: Never Smoker  . Smokeless tobacco: Never Used  . Alcohol use No    Review of Systems  DATA OBTAINED: from patient GENERAL:  no fevers, fatigue, appetite changes SKIN: No itching, rash HEENT: No complaint RESPIRATORY: No cough, wheezing, SOB CARDIAC: No chest pain, palpitations, lower extremity  edema  GI: No abdominal pain, No N/V/D or constipation, No heartburn or reflux  GU: No dysuria, frequency or urgency, or incontinence  MUSCULOSKELETAL:pain R knee as per HPI NEUROLOGIC: No headache, dizziness  PSYCHIATRIC: No overt anxiety or sadness  Vitals:   05/09/16 1344  BP: 128/70  Pulse: 64  Resp: (!) 22  Temp: 97.3 F (36.3 C)   Body mass index is 31.83 kg/m. Physical Exam  GENERAL APPEARANCE: Alert, conversant, No acute distress  SKIN: No diaphoresis rash HEENT: Unremarkable RESPIRATORY: Breathing is even, unlabored. Lung sounds are clear   CARDIOVASCULAR: Heart RRR no murmurs, rubs or gallops. No peripheral edema  GASTROINTESTINAL:  Abdomen is soft, non-tender, not distended w/ normal bowel sounds.  GENITOURINARY: Bladder non tender, not distended  MUSCULOSKELETAL: soft tissue swelling upper outer quadrant of R knee; has joint line tenderness; midline scar; no redness or heat NEUROLOGIC: Cranial nerves 2-12 grossly intact. Moves all extremities PSYCHIATRIC: Mood and affect appropriate to situation, no behavioral issues  Patient Active Problem List   Diagnosis Date Noted  . CKD (chronic kidney disease) stage 3, GFR 30-59 ml/min 04/14/2016  . Hyperlipidemia 03/17/2016  . Fever 05/10/2014  . Nausea with vomiting 03/14/2014  . Mouth pain 11/17/2013  . Anxiety state 02/25/2013  . Lethargy 02/25/2013  . Osteoarthritis 02/04/2013  . Edema 02/04/2013  . Other convulsions 11/27/2012  . Unspecified constipation 11/27/2012  . Pulmonary embolus ([place]) 10/12/2012  . Long term current use of anticoagulant therapy 10/12/2012  . E. coli UTI 10/12/2012  . Essential hypertension, benign 10/12/2012  . Depression 10/12/2012  . Hypothyroidism 10/12/2012  . Dementia, multi-infarct 10/12/2012  . GERD (gastroesophageal reflux disease) 10/12/2012  . Psychosis 10/12/2012  . Hypokalemia 10/12/2012  . Hyponatremia 10/12/2012    CMP     Component Value Date/Time   NA 130 (A) 04/02/2016   K 4.7 04/02/2016   CL 98 08/28/2010 1845   CO2 31 08/28/2010 1845   GLUCOSE 89 08/28/2010 1845   BUN 19 04/02/2016   CREATININE 1.0 04/02/2016   CREATININE 1.0 08/28/2010 1845   CALCIUM 9.6 08/28/2010 1845   AST 22 04/02/2016   ALT 12 04/02/2016   ALKPHOS 67 04/02/2016   GFRNONAA 54 (L) 08/28/2010 1845   GFRAA  08/28/2010 1845    >60        The eGFR has been calculated using the MDRD equation. This calculation has not been validated in all clinical situations. eGFR's persistently <60 mL/min signify possible Chronic Kidney Disease.    Recent Labs  04/02/16  NA 130*  K 4.7  BUN 19  CREATININE 1.0    Recent Labs  04/02/16    AST 22  ALT 12  ALKPHOS 67    Recent Labs  12/20/15 04/02/16  WBC 5.8 4.8  HGB 14.1 14.1  HCT 42 42  PLT 254 268    Recent Labs  11/03/15 04/02/16  CHOL 224* 220*  LDLCALC 142 116  TRIG 72 61   No results found for: [facility] Lab Results  Component Value Date   TSH 1.47 04/02/2016   Lab Results  Component Value Date   HGBA1C 5.5 04/02/2016   Lab Results  Component Value Date   CHOL 220 (A) 04/02/2016   HDL 92 (A) 04/02/2016   LDLCALC 116 04/02/2016   TRIG 61 04/02/2016    Significant Diagnostic Results in last 30 days:  No results found.  Assessment and Plan  R KNEE PAIN - plan a short burst of prednisone 60/40/40; if  arthritis may help some, if cartilage tear will not;will monitor      Shelina Luo D. [name], MD

## 2016-05-14 ENCOUNTER — Encounter: Payer: Self-pay | Admitting: Internal Medicine

## 2016-05-14 ENCOUNTER — Non-Acute Institutional Stay (SKILLED_NURSING_FACILITY): Payer: Medicare Other | Admitting: Internal Medicine

## 2016-05-14 DIAGNOSIS — M25561 Pain in right knee: Secondary | ICD-10-CM | POA: Diagnosis not present

## 2016-05-14 NOTE — Progress Notes (Signed)
Location:  [facility] Room Number: [phone] Place of Service:  SNF [phone])  Tricia Martin. Tricia Coil, MD  No care team member to display  Extended Emergency Contact Information Primary Emergency Contact: Culclasure,[name]  [place] Phone: [phone] Relation: Other    Allergies: Ciprofloxacin hcl; Codeine; Duloxetine hcl; Hctz [hydrochlorothiazide]; Metaxalone; Morphine and related; Olmesartan; Penicillins; Quetiapine fumarate; and Zofran [ondansetron hcl]  Chief Complaint  Patient presents with  . Acute Visit    Acute Visit    HPI: Patient is 82 y.o. female who told nursing she was not getting out of bed until she sees me. Pt was seen about 5 days ago for pain in R lateral knee. Pt had been fine when she went to bed and woke up with knee pain which she noted as soon as she tried to bear weight. She denied trauma. She was treated with a short burst of prednisone which did not help.Pt c/o continued pain that maybe be getting worse.  Past Medical History:  Diagnosis Date  . Hyperlipidemia   . Hypertension   . Hypothyroid   . OA (osteoarthritis)   . PE (pulmonary embolism)     Past Surgical History:  Procedure Laterality Date  . JOINT REPLACEMENT     B knees      Medication List       Accurate as of 05/14/16  1:38 PM. Always use your most recent med list.          calcium-vitamin D 500-200 MG-UNIT tablet Commonly known as:  OSCAL WITH D Take 1 tablet by mouth 2 (two) times daily.   DAILY VITE Tabs Take 1 tablet by mouth daily.   docusate sodium 100 MG capsule Commonly known as:  COLACE Take 100 mg by mouth 2 (two) times daily. Hold for loose stool   famotidine 20 MG tablet Commonly known as:  PEPCID Take 20 mg by mouth at bedtime. For reflux   fish oil-omega-3 fatty acids 1000 MG capsule Take 1 g by mouth 2 (two) times daily. For hyperlipidemia   levothyroxine 75 MCG tablet Commonly known as:  SYNTHROID,  LEVOTHROID Take 75 mcg by mouth daily. For hypothyroidism   lisinopril 5 MG tablet Commonly known as:  PRINIVIL,ZESTRIL Take 5 mg by mouth daily.   memantine 10 MG tablet Commonly known as:  NAMENDA Take 10 mg by mouth 2 (two) times daily. For dementia   polyethylene glycol packet Commonly known as:  MIRALAX / GLYCOLAX Take 17 g by mouth daily. HOLD FOR LOOSE STOOLS   rivaroxaban 20 MG Tabs tablet Commonly known as:  XARELTO Take 20 mg by mouth daily with supper.   sodium chloride 1 g tablet Take 1 g by mouth 2 (two) times daily. For sodium supplement   TYLENOL 8 HOUR ARTHRITIS PAIN 650 MG CR tablet Generic drug:  acetaminophen Take 650 mg by mouth every 12 (twelve) hours. For arthritis   venlafaxine XR 37.5 MG 24 hr capsule Commonly known as:  EFFEXOR-XR Take 37.5 mg by mouth daily with breakfast.       No orders of the defined types were placed in this encounter.   Immunization History  Administered Date(s) Administered  . Influenza-Unspecified 04/26/2013, 05/20/2014, 04/21/2015    Social History  Substance Use Topics  . Smoking status: Never Smoker  . Smokeless tobacco: Never Used  . Alcohol use No    Review of Systems  DATA OBTAINED: from patient GENERAL:  no fevers, fatigue, appetite  changes SKIN: No itching, rash HEENT: No complaint RESPIRATORY: No cough, wheezing, SOB CARDIAC: No chest pain, palpitations, lower extremity edema  GI: No abdominal pain, No N/V/D or constipation, No heartburn or reflux  GU: No dysuria, frequency or urgency, or incontinence  MUSCULOSKELETAL: as per HPI NEUROLOGIC: No headache, dizziness  PSYCHIATRIC: No overt anxiety or sadness  Vitals:   05/14/16 1335  BP: 128/70  Pulse: 80  Resp: (!) 22  Temp: 97.3 F (36.3 C)   Body mass index is 31.83 kg/m. Physical Exam  GENERAL APPEARANCE: Alert, conversant, No acute distress  SKIN: No diaphoresis rash HEENT: Unremarkable RESPIRATORY: Breathing is even, unlabored.  Lung sounds are clear   CARDIOVASCULAR: Heart RRR no murmurs, rubs or gallops. No peripheral edema  GASTROINTESTINAL: Abdomen is soft, non-tender, not distended w/ normal bowel sounds.  GENITOURINARY: Bladder non tender, not distended  MUSCULOSKELETAL: R knee - midline scar from prior surgery; TTP lateral joint line with mild crepitance and swelling in upper outer quadrant, no redness or heat NEUROLOGIC: Cranial nerves 2-12 grossly intact. Moves all extremities PSYCHIATRIC: Mood and affect appropriate to situation with some dementia, no behavioral issues  Patient Active Problem List   Diagnosis Date Noted  . CKD (chronic kidney disease) stage 3, GFR 30-59 ml/min 04/14/2016  . Hyperlipidemia 03/17/2016  . Fever 05/10/2014  . Nausea with vomiting 03/14/2014  . Mouth pain 11/17/2013  . Anxiety state 02/25/2013  . Lethargy 02/25/2013  . Osteoarthritis 02/04/2013  . Edema 02/04/2013  . Other convulsions 11/27/2012  . Unspecified constipation 11/27/2012  . Pulmonary embolus ([place]) 10/12/2012  . Long term current use of anticoagulant therapy 10/12/2012  . E. coli UTI 10/12/2012  . Essential hypertension, benign 10/12/2012  . Depression 10/12/2012  . Hypothyroidism 10/12/2012  . Dementia, multi-infarct 10/12/2012  . GERD (gastroesophageal reflux disease) 10/12/2012  . Psychosis 10/12/2012  . Hypokalemia 10/12/2012  . Hyponatremia 10/12/2012    CMP     Component Value Date/Time   NA 130 (A) 04/02/2016   K 4.7 04/02/2016   CL 98 08/28/2010 1845   CO2 31 08/28/2010 1845   GLUCOSE 89 08/28/2010 1845   BUN 19 04/02/2016   CREATININE 1.0 04/02/2016   CREATININE 1.0 08/28/2010 1845   CALCIUM 9.6 08/28/2010 1845   AST 22 04/02/2016   ALT 12 04/02/2016   ALKPHOS 67 04/02/2016   GFRNONAA 54 (L) 08/28/2010 1845   GFRAA  08/28/2010 1845    >60        The eGFR has been calculated using the MDRD equation. This calculation has not been validated in all clinical situations. eGFR's  persistently <60 mL/min signify possible Chronic Kidney Disease.    Recent Labs  04/02/16  NA 130*  K 4.7  BUN 19  CREATININE 1.0    Recent Labs  04/02/16  AST 22  ALT 12  ALKPHOS 67    Recent Labs  12/20/15 04/02/16  WBC 5.8 4.8  HGB 14.1 14.1  HCT 42 42  PLT 254 268    Recent Labs  11/03/15 04/02/16  CHOL 224* 220*  LDLCALC 142 116  TRIG 72 61   No results found for: [facility] Lab Results  Component Value Date   TSH 1.47 04/02/2016   Lab Results  Component Value Date   HGBA1C 5.5 04/02/2016   Lab Results  Component Value Date   CHOL 220 (A) 04/02/2016   HDL 92 (A) 04/02/2016   LDLCALC 116 04/02/2016   TRIG 61 04/02/2016    Significant Diagnostic  Results in last 30 days:  No results found.  Assessment and Plan  R KNEE PAIN - with joint line tenderness; worst case scenario-at her age it may not take much trauma to tear cartilage; will send to ortho as upt     Armine Rizzolo D. Tricia Coil, MD

## 2016-05-19 ENCOUNTER — Encounter: Payer: Self-pay | Admitting: Internal Medicine

## 2016-05-19 NOTE — Assessment & Plan Note (Signed)
Tsh1.47 IN 03/2016;PLAN TO CONT SYNTHROID 75 MCG DAILY

## 2016-05-19 NOTE — Assessment & Plan Note (Signed)
Pt continues on pepcid 20 mg daily with no reports of reflux

## 2016-05-19 NOTE — Assessment & Plan Note (Signed)
Mild to moderate; plan to cont namenda 10 mg BID

## 2016-05-21 ENCOUNTER — Ambulatory Visit (INDEPENDENT_AMBULATORY_CARE_PROVIDER_SITE_OTHER): Payer: Medicare Other | Admitting: Sports Medicine

## 2016-05-24 ENCOUNTER — Non-Acute Institutional Stay (SKILLED_NURSING_FACILITY): Payer: Medicare Other | Admitting: Internal Medicine

## 2016-05-24 ENCOUNTER — Encounter: Payer: Self-pay | Admitting: Internal Medicine

## 2016-05-24 DIAGNOSIS — F4321 Adjustment disorder with depressed mood: Secondary | ICD-10-CM | POA: Diagnosis not present

## 2016-05-24 NOTE — Progress Notes (Signed)
Location:  [facility] Room Number: [phone] Place of Service:  SNF [phone])  Tricia Martin. Tricia Coil, MD  No care team member to display  Extended Emergency Contact Information Primary Emergency Contact: Culclasure,[name]  [place] Phone: [phone] Relation: Other    Allergies: Ciprofloxacin hcl; Codeine; Duloxetine hcl; Hctz [hydrochlorothiazide]; Metaxalone; Morphine and related; Olmesartan; Penicillins; Quetiapine fumarate; and Zofran [ondansetron hcl]  Chief Complaint  Patient presents with  . Acute Visit    Acute    HPI: Patient is 82 y.o. female who nursing asked me to see. Pt told her that she thinks there is something wrong with her brain, something is going on in her brain she can't figure out.  Past Medical History:  Diagnosis Date  . Hyperlipidemia   . Hypertension   . Hypothyroid   . OA (osteoarthritis)   . PE (pulmonary embolism)     Past Surgical History:  Procedure Laterality Date  . JOINT REPLACEMENT     B knees      Medication List       Accurate as of 05/24/16  3:47 PM. Always use your most recent med list.          calcium-vitamin D 500-200 MG-UNIT tablet Commonly known as:  OSCAL WITH D Take 1 tablet by mouth 2 (two) times daily.   DAILY VITE Tabs Take 1 tablet by mouth daily.   docusate sodium 100 MG capsule Commonly known as:  COLACE Take 100 mg by mouth 2 (two) times daily. Hold for loose stool   famotidine 20 MG tablet Commonly known as:  PEPCID Take 20 mg by mouth at bedtime. For reflux   fish oil-omega-3 fatty acids 1000 MG capsule Take 1 g by mouth 2 (two) times daily. For hyperlipidemia   levothyroxine 75 MCG tablet Commonly known as:  SYNTHROID, LEVOTHROID Take 75 mcg by mouth daily. For hypothyroidism   lisinopril 5 MG tablet Commonly known as:  PRINIVIL,ZESTRIL Take 5 mg by mouth daily.   memantine 10 MG tablet Commonly known as:  NAMENDA Take 10 mg by mouth 2 (two)  times daily. For dementia   polyethylene glycol packet Commonly known as:  MIRALAX / GLYCOLAX Take 17 g by mouth daily. HOLD FOR LOOSE STOOLS   rivaroxaban 20 MG Tabs tablet Commonly known as:  XARELTO Take 20 mg by mouth daily with supper.   sodium chloride 1 g tablet Take 1 g by mouth 2 (two) times daily. For sodium supplement   TYLENOL 8 HOUR ARTHRITIS PAIN 650 MG CR tablet Generic drug:  acetaminophen Take 650 mg by mouth every 12 (twelve) hours. For arthritis   venlafaxine XR 75 MG 24 hr capsule Commonly known as:  EFFEXOR-XR Take 75 mg by mouth daily with breakfast.       Meds ordered this encounter  Medications  . venlafaxine XR (EFFEXOR-XR) 75 MG 24 hr capsule    Sig: Take 75 mg by mouth daily with breakfast.    Immunization History  Administered Date(s) Administered  . Influenza-Unspecified 04/26/2013, 05/20/2014, 04/21/2015    Social History  Substance Use Topics  . Smoking status: Never Smoker  . Smokeless tobacco: Never Used  . Alcohol use No    Review of Systems  DATA OBTAINED: from patient, nurse GENERAL:  no fevers, fatigue, appetite changes SKIN: No itching, rash HEENT: No complaint RESPIRATORY: No cough, wheezing, SOB CARDIAC: No chest pain, palpitations, lower extremity edema  GI: No abdominal pain, No N/V/D or  constipation, No heartburn or reflux  GU: No dysuria, frequency or urgency, or incontinence  MUSCULOSKELETAL: No unrelieved bone/joint pain NEUROLOGIC: No headache, dizziness  PSYCHIATRIC: No overt anxiety or sadness; "I have 3 wonderful children, I'm right with God"  Vitals:   05/24/16 1543  BP: 128/70  Pulse: 78  Resp: 20  Temp: 97.3 F (36.3 C)   Body mass index is 31.83 kg/m. Physical Exam  GENERAL APPEARANCE: Alert, conversant, No acute distress  SKIN: No diaphoresis rash HEENT: Unremarkable RESPIRATORY: Breathing is even, unlabored. Lung sounds are clear   CARDIOVASCULAR: Heart RRR no murmurs, rubs or gallops. No  peripheral edema  GASTROINTESTINAL: Abdomen is soft, non-tender, not distended w/ normal bowel sounds.  GENITOURINARY: Bladder non tender, not distended  MUSCULOSKELETAL: No abnormal joints or musculature NEUROLOGIC: Cranial nerves 2-12 grossly intact. Moves all extremities PSYCHIATRIC: Mood and affect appropriate to situation with some mild dementia, no behavioral issues  Patient Active Problem List   Diagnosis Date Noted  . CKD (chronic kidney disease) stage 3, GFR 30-59 ml/min 04/14/2016  . Hyperlipidemia 03/17/2016  . Fever 05/10/2014  . Nausea with vomiting 03/14/2014  . Mouth pain 11/17/2013  . Anxiety state 02/25/2013  . Lethargy 02/25/2013  . Osteoarthritis 02/04/2013  . Edema 02/04/2013  . Other convulsions 11/27/2012  . Unspecified constipation 11/27/2012  . Pulmonary embolus ([place]) 10/12/2012  . Long term current use of anticoagulant therapy 10/12/2012  . E. coli UTI 10/12/2012  . Essential hypertension, benign 10/12/2012  . Depression 10/12/2012  . Hypothyroidism 10/12/2012  . Dementia, multi-infarct 10/12/2012  . GERD (gastroesophageal reflux disease) 10/12/2012  . Psychosis 10/12/2012  . Hypokalemia 10/12/2012  . Hyponatremia 10/12/2012    CMP     Component Value Date/Time   NA 130 (A) 04/02/2016   K 4.7 04/02/2016   CL 98 08/28/2010 1845   CO2 31 08/28/2010 1845   GLUCOSE 89 08/28/2010 1845   BUN 19 04/02/2016   CREATININE 1.0 04/02/2016   CREATININE 1.0 08/28/2010 1845   CALCIUM 9.6 08/28/2010 1845   AST 22 04/02/2016   ALT 12 04/02/2016   ALKPHOS 67 04/02/2016   GFRNONAA 54 (L) 08/28/2010 1845   GFRAA  08/28/2010 1845    >60        The eGFR has been calculated using the MDRD equation. This calculation has not been validated in all clinical situations. eGFR's persistently <60 mL/min signify possible Chronic Kidney Disease.    Recent Labs  04/02/16  NA 130*  K 4.7  BUN 19  CREATININE 1.0    Recent Labs  04/02/16  AST 22  ALT 12    ALKPHOS 67    Recent Labs  12/20/15 04/02/16  WBC 5.8 4.8  HGB 14.1 14.1  HCT 42 42  PLT 254 268    Recent Labs  11/03/15 04/02/16  CHOL 224* 220*  LDLCALC 142 116  TRIG 72 61   No results found for: [facility] Lab Results  Component Value Date   TSH 1.47 04/02/2016   Lab Results  Component Value Date   HGBA1C 5.5 04/02/2016   Lab Results  Component Value Date   CHOL 220 (A) 04/02/2016   HDL 92 (A) 04/02/2016   LDLCALC 116 04/02/2016   TRIG 61 04/02/2016    Significant Diagnostic Results in last 30 days:  No results found.  Assessment and Plan  SITUATIONAL DEPRESSION - pt and I spoke for quite a while. I don't think this is really depression exactly. Pt is worried in the  morning about her children and it bothers her. She is realizing she doesn't have anyone to talk to, she's in an adjustment period. I also think that her dementia, even though it is mild, is preventing her from thinking like she wants to to work it out; pt said just speaking with me had helped; I told her to ask for me when she need to. I will inc her effexor XR 37.5 mg to 75 mg daily and monitor.     Time spent > 35 min Haydin Calandra D. Tricia Coil, MD

## 2016-06-05 ENCOUNTER — Encounter: Payer: Self-pay | Admitting: Internal Medicine

## 2016-06-05 ENCOUNTER — Non-Acute Institutional Stay (SKILLED_NURSING_FACILITY): Payer: Medicare Other | Admitting: Internal Medicine

## 2016-06-05 DIAGNOSIS — I10 Essential (primary) hypertension: Secondary | ICD-10-CM | POA: Diagnosis not present

## 2016-06-05 DIAGNOSIS — E871 Hypo-osmolality and hyponatremia: Secondary | ICD-10-CM

## 2016-06-05 DIAGNOSIS — E785 Hyperlipidemia, unspecified: Secondary | ICD-10-CM | POA: Diagnosis not present

## 2016-06-05 NOTE — Progress Notes (Signed)
Location:  [facility] Room Number: [phone] Place of Service:  SNF [phone])  [name], MD  No care team member to display  Extended Emergency Contact Information Primary Emergency Contact: Culclasure,[name]  [place] Phone: [phone] Relation: Other    Allergies: Ciprofloxacin hcl; Codeine; Duloxetine hcl; Hctz [hydrochlorothiazide]; Metaxalone; Morphine and related; Olmesartan; Penicillins; Quetiapine fumarate; and Zofran [ondansetron hcl]  Chief Complaint  Patient presents with  . Medical Management of Chronic Issues    Routine Visit    HPI: Patient is 82 y.o. female who is being seen for routine issues of HTN, HLD and hyponatremia.  Past Medical History:  Diagnosis Date  . Hyperlipidemia   . Hypertension   . Hypothyroid   . OA (osteoarthritis)   . PE (pulmonary embolism)     Past Surgical History:  Procedure Laterality Date  . JOINT REPLACEMENT     B knees      Medication List       Accurate as of 06/05/16 11:59 PM. Always use your most recent med list.          calcium-vitamin D 500-200 MG-UNIT tablet Commonly known as:  OSCAL WITH D Take 1 tablet by mouth 2 (two) times daily.   DAILY VITE Tabs Take 1 tablet by mouth daily.   docusate sodium 100 MG capsule Commonly known as:  COLACE Take 100 mg by mouth 2 (two) times daily. Hold for loose stool   famotidine 20 MG tablet Commonly known as:  PEPCID Take 20 mg by mouth at bedtime. For reflux   fish oil-omega-3 fatty acids 1000 MG capsule Take 1 g by mouth 2 (two) times daily. For hyperlipidemia   levothyroxine 75 MCG tablet Commonly known as:  SYNTHROID, LEVOTHROID Take 75 mcg by mouth daily. For hypothyroidism   lisinopril 5 MG tablet Commonly known as:  PRINIVIL,ZESTRIL Take 5 mg by mouth daily.   memantine 10 MG tablet Commonly known as:  NAMENDA Take 10 mg by mouth 2 (two) times daily. For dementia   polyethylene glycol  packet Commonly known as:  MIRALAX / GLYCOLAX Take 17 g by mouth daily. HOLD FOR LOOSE STOOLS   rivaroxaban 20 MG Tabs tablet Commonly known as:  XARELTO Take 20 mg by mouth daily with supper.   sodium chloride 1 g tablet Take 1 g by mouth 3 (three) times daily with meals. For sodium supplement   TYLENOL 8 HOUR ARTHRITIS PAIN 650 MG CR tablet Generic drug:  acetaminophen Take 650 mg by mouth every 12 (twelve) hours. For arthritis   venlafaxine XR 75 MG 24 hr capsule Commonly known as:  EFFEXOR-XR Take 75 mg by mouth daily with breakfast.       No orders of the defined types were placed in this encounter.   Immunization History  Administered Date(s) Administered  . Influenza-Unspecified 04/26/2013, 05/20/2014, 04/21/2015, 05/28/2016    Social History  Substance Use Topics  . Smoking status: Never Smoker  . Smokeless tobacco: Never Used  . Alcohol use No    Review of Systems  DATA OBTAINED: from patient, nurse GENERAL:  no fevers, fatigue, appetite changes SKIN: No itching, rash HEENT: No complaint RESPIRATORY: No cough, wheezing, SOB CARDIAC: No chest pain, palpitations, lower extremity edema  GI: No abdominal pain, No N/V/D or constipation, No heartburn or reflux  GU: No dysuria, frequency or urgency, or incontinence  MUSCULOSKELETAL: No unrelieved bone/joint pain NEUROLOGIC: No headache, dizziness  PSYCHIATRIC: No overt anxiety or  sadness  Vitals:   06/05/16 0829  BP: 121/71  Pulse: 69  Resp: 18  Temp: (!) 96.7 F (35.9 C)   Body mass index is 32.47 kg/m. Physical Exam  GENERAL APPEARANCE: Alert, conversant, No acute distress  SKIN: No diaphoresis rash HEENT: Unremarkable RESPIRATORY: Breathing is even, unlabored. Lung sounds are clear   CARDIOVASCULAR: Heart RRR no murmurs, rubs or gallops. No peripheral edema  GASTROINTESTINAL: Abdomen is soft, non-tender, not distended w/ normal bowel sounds.  GENITOURINARY: Bladder non tender, not distended   MUSCULOSKELETAL: No abnormal joints or musculature NEUROLOGIC: Cranial nerves 2-12 grossly intact. Moves all extremities PSYCHIATRIC: Mood and affect appropriate to situation, no behavioral issues  Patient Active Problem List   Diagnosis Date Noted  . CKD (chronic kidney disease) stage 3, GFR 30-59 ml/min 04/14/2016  . Hyperlipidemia 03/17/2016  . Fever 05/10/2014  . Nausea with vomiting 03/14/2014  . Mouth pain 11/17/2013  . Anxiety state 02/25/2013  . Lethargy 02/25/2013  . Osteoarthritis 02/04/2013  . Edema 02/04/2013  . Other convulsions 11/27/2012  . Unspecified constipation 11/27/2012  . Pulmonary embolus ([place]) 10/12/2012  . Long term current use of anticoagulant therapy 10/12/2012  . E. coli UTI 10/12/2012  . Essential hypertension, benign 10/12/2012  . Depression 10/12/2012  . Hypothyroidism 10/12/2012  . Dementia, multi-infarct 10/12/2012  . GERD (gastroesophageal reflux disease) 10/12/2012  . Psychosis 10/12/2012  . Hypokalemia 10/12/2012  . Hyponatremia 10/12/2012    CMP     Component Value Date/Time   NA 130 (A) 04/02/2016   K 4.7 04/02/2016   CL 98 08/28/2010 1845   CO2 31 08/28/2010 1845   GLUCOSE 89 08/28/2010 1845   BUN 19 04/02/2016   CREATININE 1.0 04/02/2016   CREATININE 1.0 08/28/2010 1845   CALCIUM 9.6 08/28/2010 1845   AST 22 04/02/2016   ALT 12 04/02/2016   ALKPHOS 67 04/02/2016   GFRNONAA 54 (L) 08/28/2010 1845   GFRAA  08/28/2010 1845    >60        The eGFR has been calculated using the MDRD equation. This calculation has not been validated in all clinical situations. eGFR's persistently <60 mL/min signify possible Chronic Kidney Disease.    Recent Labs  04/02/16  NA 130*  K 4.7  BUN 19  CREATININE 1.0    Recent Labs  04/02/16  AST 22  ALT 12  ALKPHOS 67    Recent Labs  12/20/15 04/02/16  WBC 5.8 4.8  HGB 14.1 14.1  HCT 42 42  PLT 254 268    Recent Labs  11/03/15 04/02/16  CHOL 224* 220*  LDLCALC 142 116   TRIG 72 61   No results found for: [facility] Lab Results  Component Value Date   TSH 1.47 04/02/2016   Lab Results  Component Value Date   HGBA1C 5.5 04/02/2016   Lab Results  Component Value Date   CHOL 220 (A) 04/02/2016   HDL 92 (A) 04/02/2016   LDLCALC 116 04/02/2016   TRIG 61 04/02/2016    Significant Diagnostic Results in last 30 days:  No results found.  Assessment and Plan  Essential hypertension, benign Well controlled; plan to cont norvasc 5 mg daily  Hyperlipidemia Recent FLP - LDL 116, HDL 92, TG 61; incerdible HDL ;plan to cont mega 3 fish oil daily  Hyponatremia Most recent Na+ 130; will need to increase sodium bicarb to 1 gm TID    [name], MD

## 2016-06-08 ENCOUNTER — Encounter: Payer: Self-pay | Admitting: Internal Medicine

## 2016-06-08 NOTE — Assessment & Plan Note (Signed)
Most recent Na+ 130; will need to increase sodium bicarb to 1 gm TID

## 2016-06-08 NOTE — Assessment & Plan Note (Signed)
Well controlled;plan to cont norvasc 5 mg daily 

## 2016-06-08 NOTE — Assessment & Plan Note (Signed)
Recent FLP - LDL 116, HDL 92, TG 61; incerdible HDL ;plan to cont mega 3 fish oil daily

## 2016-07-04 ENCOUNTER — Encounter: Payer: Self-pay | Admitting: Internal Medicine

## 2016-07-04 ENCOUNTER — Non-Acute Institutional Stay (SKILLED_NURSING_FACILITY): Payer: Medicare Other | Admitting: Internal Medicine

## 2016-07-04 DIAGNOSIS — F329 Major depressive disorder, single episode, unspecified: Secondary | ICD-10-CM

## 2016-07-04 DIAGNOSIS — I2782 Chronic pulmonary embolism: Secondary | ICD-10-CM | POA: Diagnosis not present

## 2016-07-04 DIAGNOSIS — K219 Gastro-esophageal reflux disease without esophagitis: Secondary | ICD-10-CM

## 2016-07-04 DIAGNOSIS — F32A Depression, unspecified: Secondary | ICD-10-CM

## 2016-07-04 NOTE — Progress Notes (Signed)
Location:  [place] Room Number: [phone] Place of Service:  SNF [phone])  Tricia Delaine. [name], MD  No care team member to display  Extended Emergency Contact Information Primary Emergency Contact: Martin,Tricia  [place] Phone: [phone] Relation: Other    Allergies: Ciprofloxacin hcl; Codeine; Duloxetine hcl; Hctz [hydrochlorothiazide]; Metaxalone; Morphine and related; Olmesartan; Penicillins; Quetiapine fumarate; and Zofran [ondansetron hcl]  Chief Complaint  Patient presents with  . Medical Management of Chronic Issues    Routine Visit    HPI: Patient is 82 y.o. female who is being seen for routine issues of depression, HX PE and GERD.  Past Medical History:  Diagnosis Date  . Hyperlipidemia   . Hypertension   . Hypothyroid   . OA (osteoarthritis)   . PE (pulmonary embolism)     Past Surgical History:  Procedure Laterality Date  . JOINT REPLACEMENT     B knees    Allergies as of 07/04/2016      Reactions   Ciprofloxacin Hcl    Codeine    Duloxetine Hcl    Hctz [hydrochlorothiazide]    Metaxalone    Morphine And Related    Olmesartan    Penicillins    Quetiapine Fumarate    Zofran [ondansetron Hcl]       Medication List       Accurate as of 07/04/16 11:59 PM. Always use your most recent med list.          calcium-vitamin D 500-200 MG-UNIT tablet Commonly known as:  OSCAL WITH D Take 1 tablet by mouth 2 (two) times daily.   DAILY VITE Tabs Take 1 tablet by mouth daily.   docusate sodium 100 MG capsule Commonly known as:  COLACE Take 100 mg by mouth 2 (two) times daily. Hold for loose stool   famotidine 20 MG tablet Commonly known as:  PEPCID Take 20 mg by mouth at bedtime. For reflux   fish oil-omega-3 fatty acids 1000 MG capsule Take 1 g by mouth 2 (two) times daily. For hyperlipidemia   levothyroxine 75 MCG tablet Commonly known as:  SYNTHROID, LEVOTHROID Take 75 mcg by mouth  daily. For hypothyroidism   lisinopril 5 MG tablet Commonly known as:  PRINIVIL,ZESTRIL Take 5 mg by mouth daily.   memantine 10 MG tablet Commonly known as:  NAMENDA Take 10 mg by mouth 2 (two) times daily. For dementia   polyethylene glycol packet Commonly known as:  MIRALAX / GLYCOLAX Take 17 g by mouth daily. HOLD FOR LOOSE STOOLS   rivaroxaban 20 MG Tabs tablet Commonly known as:  XARELTO Take 20 mg by mouth daily with supper.   sodium chloride 1 g tablet Take 1 g by mouth 3 (three) times daily with meals. For sodium supplement   TYLENOL 8 HOUR ARTHRITIS PAIN 650 MG CR tablet Generic drug:  acetaminophen Take 650 mg by mouth every 12 (twelve) hours. For arthritis   venlafaxine XR 75 MG 24 hr capsule Commonly known as:  EFFEXOR-XR Take 75 mg by mouth daily with breakfast.       No orders of the defined types were placed in this encounter.   Immunization History  Administered Date(s) Administered  . Influenza-Unspecified 04/26/2013, 05/20/2014, 04/21/2015, 05/28/2016    Social History  Substance Use Topics  . Smoking status: Never Smoker  . Smokeless tobacco: Never Used  . Alcohol use No    Review of Systems  DATA OBTAINED: from patient, nurse GENERAL:  no  fevers, fatigue, appetite changes SKIN: No itching, rash HEENT: No complaint RESPIRATORY: No cough, wheezing, SOB CARDIAC: No chest pain, palpitations, lower extremity edema  GI: No abdominal pain, No N/V/D or constipation, No heartburn or reflux  GU: No dysuria, frequency or urgency, or incontinence  MUSCULOSKELETAL: No unrelieved bone/joint pain NEUROLOGIC: No headache, dizziness  PSYCHIATRIC: No overt anxiety or sadness  Vitals:   07/04/16 0935  BP: 116/76  Pulse: 80  Resp: 19  Temp: 98 F (36.7 C)   Body mass index is 32.02 kg/m. Physical Exam  GENERAL APPEARANCE: Alert, conversant, No acute distress  SKIN: No diaphoresis rash HEENT: Unremarkable RESPIRATORY: Breathing is even,  unlabored. Lung sounds are clear   CARDIOVASCULAR: Heart RRR no murmurs, rubs or gallops. No peripheral edema  GASTROINTESTINAL: Abdomen is soft, non-tender, not distended w/ normal bowel sounds.  GENITOURINARY: Bladder non tender, not distended  MUSCULOSKELETAL: No abnormal joints or musculature NEUROLOGIC: Cranial nerves 2-12 grossly intact. Moves all extremities PSYCHIATRIC: Mood and affect appropriate to situation with some dementia, no behavioral issues  Patient Active Problem List   Diagnosis Date Noted  . CKD (chronic kidney disease) stage 3, GFR 30-59 ml/min 04/14/2016  . Hyperlipidemia 03/17/2016  . Fever 05/10/2014  . Nausea with vomiting 03/14/2014  . Mouth pain 11/17/2013  . Anxiety state 02/25/2013  . Lethargy 02/25/2013  . Osteoarthritis 02/04/2013  . Edema 02/04/2013  . Other convulsions 11/27/2012  . Unspecified constipation 11/27/2012  . Pulmonary embolus ([place]) 10/12/2012  . Long term current use of anticoagulant therapy 10/12/2012  . E. coli UTI 10/12/2012  . Essential hypertension, benign 10/12/2012  . Depression 10/12/2012  . Hypothyroidism 10/12/2012  . Dementia, multi-infarct 10/12/2012  . GERD (gastroesophageal reflux disease) 10/12/2012  . Psychosis 10/12/2012  . Hypokalemia 10/12/2012  . Hyponatremia 10/12/2012    CMP     Component Value Date/Time   NA 130 (A) 04/02/2016   K 4.7 04/02/2016   CL 98 08/28/2010 1845   CO2 31 08/28/2010 1845   GLUCOSE 89 08/28/2010 1845   BUN 19 04/02/2016   CREATININE 1.0 04/02/2016   CREATININE 1.0 08/28/2010 1845   CALCIUM 9.6 08/28/2010 1845   AST 22 04/02/2016   ALT 12 04/02/2016   ALKPHOS 67 04/02/2016   GFRNONAA 54 (L) 08/28/2010 1845   GFRAA  08/28/2010 1845    >60        The eGFR has been calculated using the MDRD equation. This calculation has not been validated in all clinical situations. eGFR's persistently <60 mL/min signify possible Chronic Kidney Disease.    Recent Labs  04/02/16  NA  130*  K 4.7  BUN 19  CREATININE 1.0    Recent Labs  04/02/16  AST 22  ALT 12  ALKPHOS 67    Recent Labs  12/20/15 04/02/16  WBC 5.8 4.8  HGB 14.1 14.1  HCT 42 42  PLT 254 268    Recent Labs  11/03/15 04/02/16  CHOL 224* 220*  LDLCALC 142 116  TRIG 72 61   No results found for: [facility] Lab Results  Component Value Date   TSH 1.47 04/02/2016   Lab Results  Component Value Date   HGBA1C 5.5 04/02/2016   Lab Results  Component Value Date   CHOL 220 (A) 04/02/2016   HDL 92 (A) 04/02/2016   LDLCALC 116 04/02/2016   TRIG 61 04/02/2016    Significant Diagnostic Results in last 30 days:  No results found.  Assessment and Plan  Depression Continues stable;plan  to cont Effexor XR 75 mg daily  Pulmonary embolus Continues stable;plan to cont xarelto 20 mg daily  GERD (gastroesophageal reflux disease) No reports of reflux or aspiration;plan to cont pepcid 20 mg daily    Tricia Martin D. [name], MD

## 2016-07-27 ENCOUNTER — Encounter: Payer: Self-pay | Admitting: Internal Medicine

## 2016-07-27 NOTE — Assessment & Plan Note (Signed)
No reports of reflux or aspiration;plan to cont pepcid 20 mg daily

## 2016-07-27 NOTE — Assessment & Plan Note (Signed)
Continues stable;plan to cont xarelto 20 mg daily

## 2016-07-27 NOTE — Assessment & Plan Note (Signed)
Continues stable;plan to cont Effexor XR 75 mg daily

## 2016-08-14 ENCOUNTER — Encounter: Payer: Self-pay | Admitting: Internal Medicine

## 2016-08-14 ENCOUNTER — Non-Acute Institutional Stay (SKILLED_NURSING_FACILITY): Payer: Medicare Other | Admitting: Internal Medicine

## 2016-08-14 DIAGNOSIS — F0150 Vascular dementia without behavioral disturbance: Secondary | ICD-10-CM

## 2016-08-14 DIAGNOSIS — N183 Chronic kidney disease, stage 3 unspecified: Secondary | ICD-10-CM

## 2016-08-14 DIAGNOSIS — I10 Essential (primary) hypertension: Secondary | ICD-10-CM | POA: Diagnosis not present

## 2016-08-14 NOTE — Progress Notes (Signed)
Location:  [facility] Room Number: [phone] Place of Service:  SNF [phone])  Randon Goldsmith. Lyn Hollingshead, MD  No care team member to display  Extended Emergency Contact Information Primary Emergency Contact: Culclasure,[name]  [place] Home Phone: [phone] Relation: Other    Allergies: Ciprofloxacin hcl; Codeine; Duloxetine hcl; Hctz [hydrochlorothiazide]; Metaxalone; Morphine and related; Olmesartan; Penicillins; Quetiapine fumarate; and Zofran [ondansetron hcl]  Chief Complaint  Patient presents with  . Medical Management of Chronic Issues    Routine Visit    HPI: Patient is 83 y.o. female who is being seen for routine issues of CKD3, dementia and HTN.  Past Medical History:  Diagnosis Date  . Hyperlipidemia   . Hypertension   . Hypothyroid   . OA (osteoarthritis)   . PE (pulmonary embolism)     Past Surgical History:  Procedure Laterality Date  . JOINT REPLACEMENT     B knees    Allergies as of 08/14/2016      Reactions   Ciprofloxacin Hcl    Codeine    Duloxetine Hcl    Hctz [hydrochlorothiazide]    Metaxalone    Morphine And Related    Olmesartan    Penicillins    Quetiapine Fumarate    Zofran [ondansetron Hcl]       Medication List       Accurate as of 08/14/16 11:59 PM. Always use your most recent med list.          calcium-vitamin D 500-200 MG-UNIT tablet Commonly known as:  OSCAL WITH D Take 1 tablet by mouth 2 (two) times daily.   DAILY VITE Tabs Take 1 tablet by mouth daily.   docusate sodium 100 MG capsule Commonly known as:  COLACE Take 100 mg by mouth 2 (two) times daily. Hold for loose stool   famotidine 20 MG tablet Commonly known as:  PEPCID Take 20 mg by mouth at bedtime. For reflux   fish oil-omega-3 fatty acids 1000 MG capsule Take 1 g by mouth 2 (two) times daily. For hyperlipidemia   levothyroxine 75 MCG tablet Commonly known as:  SYNTHROID, LEVOTHROID Take 75 mcg by mouth daily.  For hypothyroidism   lisinopril 5 MG tablet Commonly known as:  PRINIVIL,ZESTRIL Take 5 mg by mouth daily.   memantine 10 MG tablet Commonly known as:  NAMENDA Take 10 mg by mouth 2 (two) times daily. For dementia   polyethylene glycol packet Commonly known as:  MIRALAX / GLYCOLAX Take 17 g by mouth daily. HOLD FOR LOOSE STOOLS   rivaroxaban 20 MG Tabs tablet Commonly known as:  XARELTO Take 20 mg by mouth daily with supper.   sodium chloride 1 g tablet Take 1 g by mouth 3 (three) times daily with meals. For sodium supplement   TYLENOL 8 HOUR ARTHRITIS PAIN 650 MG CR tablet Generic drug:  acetaminophen Take 650 mg by mouth every 12 (twelve) hours. For arthritis   venlafaxine XR 75 MG 24 hr capsule Commonly known as:  EFFEXOR-XR Take 75 mg by mouth daily with breakfast.       No orders of the defined types were placed in this encounter.   Immunization History  Administered Date(s) Administered  . Influenza-Unspecified 04/26/2013, 05/20/2014, 04/21/2015, 05/28/2016    Social History  Substance Use Topics  . Smoking status: Never Smoker  . Smokeless tobacco: Never Used  . Alcohol use No    Review of Systems  DATA OBTAINED: from patient, nurse GENERAL:  no fevers,  fatigue, appetite changes SKIN: No itching, rash HEENT: No complaint RESPIRATORY: No cough, wheezing, SOB CARDIAC: No chest pain, palpitations, lower extremity edema  GI: No abdominal pain, No N/V/D or constipation, No heartburn or reflux  GU: No dysuria, frequency or urgency, or incontinence  MUSCULOSKELETAL: No unrelieved bone/joint pain NEUROLOGIC: No headache, dizziness  PSYCHIATRIC: No overt anxiety or sadness  Vitals:   08/14/16 0926  BP: 116/76  Pulse: 70  Resp: (!) 22  Temp: 97.5 F (36.4 C)   Body mass index is 32.35 kg/m. Physical Exam  GENERAL APPEARANCE: Alert, conversant, No acute distress  SKIN: No diaphoresis rash HEENT: Unremarkable RESPIRATORY: Breathing is even,  unlabored. Lung sounds are clear   CARDIOVASCULAR: Heart RRR no murmurs, rubs or gallops. No peripheral edema  GASTROINTESTINAL: Abdomen is soft, non-tender, not distended w/ normal bowel sounds.  GENITOURINARY: Bladder non tender, not distended  MUSCULOSKELETAL: No abnormal joints or musculature NEUROLOGIC: Cranial nerves 2-12 grossly intact. Moves all extremities PSYCHIATRIC: Mood and affect appropriate to situation, no behavioral issues  Patient Active Problem List   Diagnosis Date Noted  . CKD (chronic kidney disease) stage 3, GFR 30-59 ml/min 04/14/2016  . Hyperlipidemia 03/17/2016  . Fever 05/10/2014  . Nausea with vomiting 03/14/2014  . Mouth pain 11/17/2013  . Anxiety state 02/25/2013  . Lethargy 02/25/2013  . Osteoarthritis 02/04/2013  . Edema 02/04/2013  . Other convulsions 11/27/2012  . Unspecified constipation 11/27/2012  . Pulmonary embolus ([place]) 10/12/2012  . Long term current use of anticoagulant therapy 10/12/2012  . E. coli UTI 10/12/2012  . Essential hypertension, benign 10/12/2012  . Depression 10/12/2012  . Hypothyroidism 10/12/2012  . Dementia, multi-infarct 10/12/2012  . GERD (gastroesophageal reflux disease) 10/12/2012  . Psychosis 10/12/2012  . Hypokalemia 10/12/2012  . Hyponatremia 10/12/2012    CMP     Component Value Date/Time   NA 130 (A) 04/02/2016   K 4.7 04/02/2016   CL 98 08/28/2010 1845   CO2 31 08/28/2010 1845   GLUCOSE 89 08/28/2010 1845   BUN 19 04/02/2016   CREATININE 1.0 04/02/2016   CREATININE 1.0 08/28/2010 1845   CALCIUM 9.6 08/28/2010 1845   AST 22 04/02/2016   ALT 12 04/02/2016   ALKPHOS 67 04/02/2016   GFRNONAA 54 (L) 08/28/2010 1845   GFRAA  08/28/2010 1845    >60        The eGFR has been calculated using the MDRD equation. This calculation has not been validated in all clinical situations. eGFR's persistently <60 mL/min signify possible Chronic Kidney Disease.    Recent Labs  04/02/16  NA 130*  K 4.7  BUN  19  CREATININE 1.0    Recent Labs  04/02/16  AST 22  ALT 12  ALKPHOS 67    Recent Labs  12/20/15 04/02/16  WBC 5.8 4.8  HGB 14.1 14.1  HCT 42 42  PLT 254 268    Recent Labs  11/03/15 04/02/16  CHOL 224* 220*  LDLCALC 142 116  TRIG 72 61   No results found for: [facility] Lab Results  Component Value Date   TSH 1.47 04/02/2016   Lab Results  Component Value Date   HGBA1C 5.5 04/02/2016   Lab Results  Component Value Date   CHOL 220 (A) 04/02/2016   HDL 92 (A) 04/02/2016   LDLCALC 116 04/02/2016   TRIG 61 04/02/2016    Significant Diagnostic Results in last 30 days:  No results found.  Assessment and Plan  CKD (chronic kidney disease) stage 3,  GFR 30-59 ml/min BUN/Cr 19/1.3, stable from prior; will cont to monitor a q6 month intervals at least  Dementia, multi-infarct Moderate, stable;plan to cont namenda 10 mg BID  Essential hypertension, benign Controlled, cont Lisinopril 5 mg daily     Adrienne Delay D. [name], MD

## 2016-08-21 ENCOUNTER — Non-Acute Institutional Stay (SKILLED_NURSING_FACILITY): Payer: Medicare Other | Admitting: Internal Medicine

## 2016-08-21 DIAGNOSIS — Z20828 Contact with and (suspected) exposure to other viral communicable diseases: Secondary | ICD-10-CM | POA: Diagnosis not present

## 2016-08-31 ENCOUNTER — Encounter: Payer: Self-pay | Admitting: Internal Medicine

## 2016-08-31 LAB — HEMOGLOBIN A1C: Hemoglobin A1C: 5.5

## 2016-08-31 NOTE — Assessment & Plan Note (Signed)
Moderate, stable;plan to cont namenda 10 mg BID

## 2016-08-31 NOTE — Assessment & Plan Note (Signed)
BUN/Cr 19/1.3, stable from prior; will cont to monitor a q6 month intervals at least

## 2016-08-31 NOTE — Assessment & Plan Note (Signed)
Controlled, cont Lisinopril 5 mg daily

## 2016-09-12 ENCOUNTER — Non-Acute Institutional Stay (SKILLED_NURSING_FACILITY): Payer: Medicare Other | Admitting: Internal Medicine

## 2016-09-12 ENCOUNTER — Encounter: Payer: Self-pay | Admitting: Internal Medicine

## 2016-09-12 DIAGNOSIS — E785 Hyperlipidemia, unspecified: Secondary | ICD-10-CM | POA: Diagnosis not present

## 2016-09-12 DIAGNOSIS — F32A Depression, unspecified: Secondary | ICD-10-CM

## 2016-09-12 DIAGNOSIS — E034 Atrophy of thyroid (acquired): Secondary | ICD-10-CM

## 2016-09-12 DIAGNOSIS — F329 Major depressive disorder, single episode, unspecified: Secondary | ICD-10-CM

## 2016-09-12 NOTE — Progress Notes (Signed)
Location:  [place] Room Number: [phone] Place of Service:  SNF [phone])  [name], MD  No care team member to display  Extended Emergency Contact Information Primary Emergency Contact: Culclasure,[name]  [place] Phone: [phone] Relation: Other    Allergies: Ciprofloxacin hcl; Codeine; Duloxetine hcl; Hctz [hydrochlorothiazide]; Metaxalone; Morphine and related; Olmesartan; Penicillins; Quetiapine fumarate; and Zofran [ondansetron hcl]  Chief Complaint  Patient presents with  . Medical Management of Chronic Issues    Routine Visit    HPI: Patient is 83 y.o. female who is beng seen for routine issues of hypothyroidism, HLD and depression.  Past Medical History:  Diagnosis Date  . Hyperlipidemia   . Hypertension   . Hypothyroid   . OA (osteoarthritis)   . PE (pulmonary embolism)     Past Surgical History:  Procedure Laterality Date  . JOINT REPLACEMENT     B knees    Allergies as of 09/12/2016      Reactions   Ciprofloxacin Hcl    Codeine    Duloxetine Hcl    Hctz [hydrochlorothiazide]    Metaxalone    Morphine And Related    Olmesartan    Penicillins    Quetiapine Fumarate    Zofran [ondansetron Hcl]       Medication List       Accurate as of 09/12/16 11:59 PM. Always use your most recent med list.          calcium-vitamin D 500-200 MG-UNIT tablet Commonly known as:  OSCAL WITH D Take 1 tablet by mouth 2 (two) times daily.   DAILY VITE Tabs Take 1 tablet by mouth daily.   docusate sodium 100 MG capsule Commonly known as:  COLACE Take 100 mg by mouth 2 (two) times daily. Hold for loose stool   famotidine 20 MG tablet Commonly known as:  PEPCID Take 20 mg by mouth daily. For reflux   fish oil-omega-3 fatty acids 1000 MG capsule Take 1 g by mouth 2 (two) times daily. For hyperlipidemia   levothyroxine 75 MCG tablet Commonly known as:  SYNTHROID, LEVOTHROID Take 75 mcg by mouth  daily. For hypothyroidism   lisinopril 5 MG tablet Commonly known as:  PRINIVIL,ZESTRIL Take 5 mg by mouth daily.   memantine 10 MG tablet Commonly known as:  NAMENDA Take 10 mg by mouth 2 (two) times daily. For dementia   polyethylene glycol packet Commonly known as:  MIRALAX / GLYCOLAX Take 17 g by mouth daily. HOLD FOR LOOSE STOOLS   rivaroxaban 20 MG Tabs tablet Commonly known as:  XARELTO Take 20 mg by mouth daily with supper.   sodium chloride 1 g tablet Take 1 g by mouth 3 (three) times daily with meals. For sodium supplement   TYLENOL 8 HOUR ARTHRITIS PAIN 650 MG CR tablet Generic drug:  acetaminophen Take 650 mg by mouth every 12 (twelve) hours. For arthritis   venlafaxine XR 75 MG 24 hr capsule Commonly known as:  EFFEXOR-XR Take 75 mg by mouth daily with breakfast.       No orders of the defined types were placed in this encounter.   Immunization History  Administered Date(s) Administered  . Influenza-Unspecified 04/26/2013, 05/20/2014, 04/21/2015, 05/28/2016    Social History  Substance Use Topics  . Smoking status: Never Smoker  . Smokeless tobacco: Never Used  . Alcohol use No    Review of Systems  DATA OBTAINED: from patient, nurse GENERAL:  no fevers, fatigue,  appetite changes SKIN: No itching, rash HEENT: No complaint RESPIRATORY: No cough, wheezing, SOB CARDIAC: No chest pain, palpitations, lower extremity edema  GI: No abdominal pain, No N/V/D or constipation, No heartburn or reflux  GU: No dysuria, frequency or urgency, or incontinence  MUSCULOSKELETAL: No unrelieved bone/joint pain NEUROLOGIC: No headache, dizziness  PSYCHIATRIC: No overt anxiety or sadness  Vitals:   09/12/16 1109  BP: 131/76  Pulse: 70  Resp: 20  Temp: 98 F (36.7 C)   Body mass index is 32.28 kg/m. Physical Exam  GENERAL APPEARANCE: Alert, conversant, No acute distress  SKIN: No diaphoresis rash HEENT: Unremarkable RESPIRATORY: Breathing is even,  unlabored. Lung sounds are clear   CARDIOVASCULAR: Heart RRR no murmurs, rubs or gallops. No peripheral edema  GASTROINTESTINAL: Abdomen is soft, non-tender, not distended w/ normal bowel sounds.  GENITOURINARY: Bladder non tender, not distended  MUSCULOSKELETAL: No abnormal  joints or musculature NEUROLOGIC: Cranial nerves 2-12 grossly intact. Moves all extremities PSYCHIATRIC: Mood and affect appropriate to situation with some dementia, no behavioral issues  Patient Active Problem List   Diagnosis Date Noted  . CKD (chronic kidney disease) stage 3, GFR 30-59 ml/min 04/14/2016  . Hyperlipidemia 03/17/2016  . Fever 05/10/2014  . Nausea with vomiting 03/14/2014  . Mouth pain 11/17/2013  . Anxiety state 02/25/2013  . Lethargy 02/25/2013  . Osteoarthritis 02/04/2013  . Edema 02/04/2013  . Other convulsions 11/27/2012  . Unspecified constipation 11/27/2012  . Pulmonary embolus ([place]) 10/12/2012  . Long term current use of anticoagulant therapy 10/12/2012  . E. coli UTI 10/12/2012  . Essential hypertension, benign 10/12/2012  . Depression 10/12/2012  . Hypothyroidism 10/12/2012  . Dementia, multi-infarct 10/12/2012  . GERD (gastroesophageal reflux disease) 10/12/2012  . Psychosis 10/12/2012  . Hypokalemia 10/12/2012  . Hyponatremia 10/12/2012    CMP     Component Value Date/Time   NA 130 (A) 04/02/2016   K 4.7 04/02/2016   CL 98 08/28/2010 1845   CO2 31 08/28/2010 1845   GLUCOSE 89 08/28/2010 1845   BUN 19 04/02/2016   CREATININE 1.0 04/02/2016   CREATININE 1.0 08/28/2010 1845   CALCIUM 9.6 08/28/2010 1845   AST 22 04/02/2016   ALT 12 04/02/2016   ALKPHOS 67 04/02/2016   GFRNONAA 54 (L) 08/28/2010 1845   GFRAA  08/28/2010 1845    >60        The eGFR has been calculated using the MDRD equation. This calculation has not been validated in all clinical situations. eGFR's persistently <60 mL/min signify possible Chronic Kidney Disease.    Recent Labs  04/02/16  NA  130*  K 4.7  BUN 19  CREATININE 1.0    Recent Labs  04/02/16  AST 22  ALT 12  ALKPHOS 67    Recent Labs  12/20/15 04/02/16  WBC 5.8 4.8  HGB 14.1 14.1  HCT 42 42  PLT 254 268    Recent Labs  11/03/15 04/02/16  CHOL 224* 220*  LDLCALC 142 116  TRIG 72 61   No results found for: [facility] Lab Results  Component Value Date   TSH 1.47 04/02/2016   Lab Results  Component Value Date   HGBA1C 5.5 08/31/2016   Lab Results  Component Value Date   CHOL 220 (A) 04/02/2016   HDL 92 (A) 04/02/2016   LDLCALC 116 04/02/2016   TRIG 61 04/02/2016    Significant Diagnostic Results in last 30 days:  No results found.  Assessment and Plan  Hypothyroidism Stable; Cont synthroid  75 mcg daily  Hyperlipidemia Stable; cont omega 3 daily  Depression Chronic and stable;plan to cont effexor XR 75 mg daily    Gurjot Brisco D. [name], MD

## 2016-09-28 ENCOUNTER — Encounter: Payer: Self-pay | Admitting: Internal Medicine

## 2016-09-28 NOTE — Assessment & Plan Note (Signed)
Stable; cont omega 3 daily

## 2016-09-28 NOTE — Assessment & Plan Note (Signed)
Stable. Cont synthroid 75 mcg daily. 

## 2016-09-28 NOTE — Assessment & Plan Note (Signed)
Chronic and stable;plan to cont effexor XR 75 mg daily

## 2016-10-01 ENCOUNTER — Encounter: Payer: Self-pay | Admitting: Internal Medicine

## 2016-10-01 NOTE — Progress Notes (Signed)
Location:  [facility] Room Number: [phone] Place of Service:  SNF [phone])  [name], MD  No care team member to display  Extended Emergency Contact Information Primary Emergency Contact: Culclasure,[name]  [place] Phone: [phone] Relation: Other    Allergies: Ciprofloxacin hcl; Codeine; Duloxetine hcl; Hctz [hydrochlorothiazide]; Metaxalone; Morphine and related; Olmesartan; Penicillins; Quetiapine fumarate; and Zofran [ondansetron hcl]  Chief Complaint  Patient presents with  . Acute Visit    HPI: Patient is 83 y.o. female who is being seen acutely because an outbreak of Influenza A per CDC guidelines was recognized on 08/21/2016. Pt has no c/o flu like symptoms;therefore pt will need to be prophylaxed with Tamiflu for a minimum of 14 days per CDC protocol.  Past Medical History:  Diagnosis Date  . Hyperlipidemia   . Hypertension   . Hypothyroid   . OA (osteoarthritis)   . PE (pulmonary embolism)     Past Surgical History:  Procedure Laterality Date  . JOINT REPLACEMENT     B knees    Allergies as of 08/21/2016      Reactions   Ciprofloxacin Hcl    Codeine    Duloxetine Hcl    Hctz [hydrochlorothiazide]    Metaxalone    Morphine And Related    Olmesartan    Penicillins    Quetiapine Fumarate    Zofran [ondansetron Hcl]       Medication List       Accurate as of 08/21/16 11:59 PM. Always use your most recent med list.          calcium-vitamin D 500-200 MG-UNIT tablet Commonly known as:  OSCAL WITH D Take 1 tablet by mouth 2 (two) times daily.   DAILY VITE Tabs Take 1 tablet by mouth daily.   docusate sodium 100 MG capsule Commonly known as:  COLACE Take 100 mg by mouth 2 (two) times daily. Hold for loose stool   famotidine 20 MG tablet Commonly known as:  PEPCID Take 20 mg by mouth daily. For reflux   fish oil-omega-3 fatty acids 1000 MG capsule Take 1 g by mouth 2 (two) times daily.  For hyperlipidemia   levothyroxine 75 MCG tablet Commonly known as:  SYNTHROID, LEVOTHROID Take 75 mcg by mouth daily. For hypothyroidism   lisinopril 5 MG tablet Commonly known as:  PRINIVIL,ZESTRIL Take 5 mg by mouth daily.   memantine 10 MG tablet Commonly known as:  NAMENDA Take 10 mg by mouth 2 (two) times daily. For dementia   polyethylene glycol packet Commonly known as:  MIRALAX / GLYCOLAX Take 17 g by mouth daily. HOLD FOR LOOSE STOOLS   rivaroxaban 20 MG Tabs tablet Commonly known as:  XARELTO Take 20 mg by mouth daily with supper.   sodium chloride 1 g tablet Take 1 g by mouth 3 (three) times daily with meals. For sodium supplement   TYLENOL 8 HOUR ARTHRITIS PAIN 650 MG CR tablet Generic drug:  acetaminophen Take 650 mg by mouth every 12 (twelve) hours. For arthritis   venlafaxine XR 75 MG 24 hr capsule Commonly known as:  EFFEXOR-XR Take 75 mg by mouth daily with breakfast.       No orders of the defined types were placed in this encounter.   Immunization History  Administered Date(s) Administered  . Influenza-Unspecified 04/26/2013, 05/20/2014, 04/21/2015, 05/28/2016    Social History  Substance Use Topics  . Smoking status: Never Smoker  . Smokeless tobacco: Never Used  .  Alcohol use No    Review of Systems  DATA OBTAINED: from patient, nurse GENERAL:  no fevers SKIN: No itching, rash HEENT: no rhinorrhea, congestion, ST or ear pain RESPIRATORY: No cough, wheezing, SOB CARDIAC: No chest pain, palpitations, lower extremity edema  GI: No abdominal pain, No N/V/D or constipation, No heartburn or reflux  MUSCULOSKELETAL: No muscle aches NEUROLOGIC: No headache, dizziness   Vitals:   08/21/16 1233  BP: 131/76  Pulse: 74  Resp: (!) 22  Temp: 98 F (36.7 C)   Body mass index is 32.28 kg/m. Physical Exam  GENERAL APPEARANCE: Alert, conversant, No acute distress  SKIN: No diaphoresis rash HEENT: Unremarkable RESPIRATORY: Breathing is  even, unlabored. Lung sounds are clear   CARDIOVASCULAR: Heart RRR no murmurs, rubs or gallops. No peripheral edema  GASTROINTESTINAL: Abdomen is soft, non-tender, not distended w/ normal bowel sounds.   NEUROLOGIC: Cranial nerves 2-12 grossly intact PSYCHIATRIC: baseline, no mental status changes  Patient Active Problem List   Diagnosis Date Noted  . CKD (chronic kidney disease) stage 3, GFR 30-59 ml/min 04/14/2016  . Hyperlipidemia 03/17/2016  . Fever 05/10/2014  . Nausea with vomiting 03/14/2014  . Mouth pain 11/17/2013  . Anxiety state 02/25/2013  . Lethargy 02/25/2013  . Osteoarthritis 02/04/2013  . Edema 02/04/2013  . Other convulsions 11/27/2012  . Unspecified constipation 11/27/2012  . Pulmonary embolus ([place]) 10/12/2012  . Long term current use of anticoagulant therapy 10/12/2012  . E. coli UTI 10/12/2012  . Essential hypertension, benign 10/12/2012  . Depression 10/12/2012  . Hypothyroidism 10/12/2012  . Dementia, multi-infarct 10/12/2012  . GERD (gastroesophageal reflux disease) 10/12/2012  . Psychosis 10/12/2012  . Hypokalemia 10/12/2012  . Hyponatremia 10/12/2012    CMP     Component Value Date/Time   NA 130 (A) 04/02/2016   K 4.7 04/02/2016   CL 98 08/28/2010 1845   CO2 31 08/28/2010 1845   GLUCOSE 89 08/28/2010 1845   BUN 19 04/02/2016   CREATININE 1.0 04/02/2016   CREATININE 1.0 08/28/2010 1845   CALCIUM 9.6 08/28/2010 1845   AST 22 04/02/2016   ALT 12 04/02/2016   ALKPHOS 67 04/02/2016   GFRNONAA 54 (L) 08/28/2010 1845   GFRAA  08/28/2010 1845    >60        The eGFR has been calculated using the MDRD equation. This calculation has not been validated in all clinical situations. eGFR's persistently <60 mL/min signify possible Chronic Kidney Disease.    Recent Labs  04/02/16  NA 130*  K 4.7  BUN 19  CREATININE 1.0    Recent Labs  04/02/16  AST 22  ALT 12  ALKPHOS 67    Recent Labs  12/20/15 04/02/16  WBC 5.8 4.8  HGB 14.1 14.1   HCT 42 42  PLT 254 268    Recent Labs  11/03/15 04/02/16  CHOL 224* 220*  LDLCALC 142 116  TRIG 72 61   No results found for: [facility] Lab Results  Component Value Date   TSH 1.47 04/02/2016   Lab Results  Component Value Date   HGBA1C 5.5 08/31/2016   Lab Results  Component Value Date   CHOL 220 (A) 04/02/2016   HDL 92 (A) 04/02/2016   LDLCALC 116 04/02/2016   TRIG 61 04/02/2016    Significant Diagnostic Results in last 30 days:  No results found.  Assessment and Plan  EXPOSURE TO FLU/ INFLUENZA OUTBREAK AT SNF-   CrCl calculated by me-  68.5    Dose for  14 days-  75 mg daily Pt will be monitored daily for flu like symptoms                                                                                 Webb Silversmith D. [name], MD

## 2016-10-10 ENCOUNTER — Encounter: Payer: Self-pay | Admitting: Internal Medicine

## 2016-10-15 ENCOUNTER — Encounter: Payer: Self-pay | Admitting: Internal Medicine

## 2016-10-15 ENCOUNTER — Non-Acute Institutional Stay (SKILLED_NURSING_FACILITY): Payer: Medicare Other | Admitting: Internal Medicine

## 2016-10-15 DIAGNOSIS — Z86711 Personal history of pulmonary embolism: Secondary | ICD-10-CM | POA: Diagnosis not present

## 2016-10-15 DIAGNOSIS — K219 Gastro-esophageal reflux disease without esophagitis: Secondary | ICD-10-CM

## 2016-10-15 DIAGNOSIS — Z7901 Long term (current) use of anticoagulants: Secondary | ICD-10-CM

## 2016-10-15 NOTE — Progress Notes (Signed)
Location:  [facility] Room Number: [phone] Place of Service:  SNF [phone])  Tricia Homes, MD  Patient Care Team: [name], MD as PCP - General (Internal Medicine)  Extended Emergency Contact Information Primary Emergency Contact: Culclasure,[name]  [place] Phone: [phone] Relation: Other    Allergies: Ciprofloxacin hcl; Codeine; Duloxetine hcl; Hctz [hydrochlorothiazide]; Metaxalone; Morphine and related; Olmesartan; Penicillins; Quetiapine fumarate; and Zofran [ondansetron hcl]  Chief Complaint  Patient presents with  . Medical Management of Chronic Issues    Routine Visit    HPI: Patient is 83 y.o. female who is being see for routine issues of h/o PE, chronic anti-coagulation therapy and GERD.  Past Medical History:  Diagnosis Date  . Hyperlipidemia   . Hypertension   . Hypothyroid   . OA (osteoarthritis)   . PE (pulmonary embolism)     Past Surgical History:  Procedure Laterality Date  . JOINT REPLACEMENT     B knees    Allergies as of 10/15/2016      Reactions   Ciprofloxacin Hcl    Codeine    Duloxetine Hcl    Hctz [hydrochlorothiazide]    Metaxalone    Morphine And Related    Olmesartan    Penicillins    Quetiapine Fumarate    Zofran [ondansetron Hcl]       Medication List       Accurate as of 10/15/16 11:59 PM. Always use your most recent med list.          calcium-vitamin D 500-200 MG-UNIT tablet Commonly known as:  OSCAL WITH D Take 1 tablet by mouth 2 (two) times daily.   DAILY VITE Tabs Take 1 tablet by mouth daily.   docusate sodium 100 MG capsule Commonly known as:  COLACE Take 100 mg by mouth 2 (two) times daily. Hold for loose stool   famotidine 20 MG tablet Commonly known as:  PEPCID Take 20 mg by mouth daily. For reflux   fish oil-omega-3 fatty acids 1000 MG capsule Take 1 g by mouth 2 (two) times daily. For hyperlipidemia   levothyroxine 75 MCG tablet Commonly  known as:  SYNTHROID, LEVOTHROID Take 75 mcg by mouth daily. For hypothyroidism   lisinopril 5 MG tablet Commonly known as:  PRINIVIL,ZESTRIL Take 5 mg by mouth daily.   memantine 10 MG tablet Commonly known as:  NAMENDA Take 10 mg by mouth 2 (two) times daily. For dementia   polyethylene glycol packet Commonly known as:  MIRALAX / GLYCOLAX Take 17 g by mouth daily. HOLD FOR LOOSE STOOLS   rivaroxaban 20 MG Tabs tablet Commonly known as:  XARELTO Take 20 mg by mouth daily with supper.   sodium chloride 1 g tablet Take 1 g by mouth 3 (three) times daily with meals. For sodium supplement   TYLENOL 8 HOUR ARTHRITIS PAIN 650 MG CR tablet Generic drug:  acetaminophen Take 650 mg by mouth every 12 (twelve) hours. For arthritis   venlafaxine XR 75 MG 24 hr capsule Commonly known as:  EFFEXOR-XR Take 75 mg by mouth daily with breakfast.       No orders of the defined types were placed in this encounter.   Immunization History  Administered Date(s) Administered  . Influenza-Unspecified 04/26/2013, 05/20/2014, 04/21/2015, 05/28/2016    Social History  Substance Use Topics  . Smoking status: Never Smoker  . Smokeless tobacco: Never Used  . Alcohol use No    Review of Systems  DATA  OBTAINED: from patient, nurse GENERAL:  no fevers, fatigue, appetite changes SKIN: No itching, rash HEENT: No complaint RESPIRATORY: No cough, wheezing, SOB CARDIAC: No chest pain, palpitations, lower extremity edema  GI: No abdominal pain, No N/V/D or constipation, No heartburn or reflux  GU: No dysuria, frequency or urgency, or incontinence  MUSCULOSKELETAL: No unrelieved bone/joint pain NEUROLOGIC: No headache, dizziness  PSYCHIATRIC: No overt anxiety or sadness  Vitals:   10/15/16 1122  BP: 128/70  Pulse: 70  Resp: (!) 22  Temp: 97 F (36.1 C)   Body mass index is 32.28 kg/m. Physical Exam  GENERAL APPEARANCE: Alert, conversant, No acute distress  SKIN: No diaphoresis  rash HEENT: Unremarkable RESPIRATORY: Breathing is even, unlabored. Lung sounds are clear   CARDIOVASCULAR: Heart RRR no murmurs, rubs or gallops. No peripheral edema  GASTROINTESTINAL: Abdomen is soft, non-tender, not distended w/ normal bowel sounds.  GENITOURINARY: Bladder non tender, not distended  MUSCULOSKELETAL: No abnormal joints or musculature NEUROLOGIC: Cranial nerves 2-12 grossly intact. Moves all extremities PSYCHIATRIC: Mood and affect appropriate to situation with some dementia, no behavioral issues  Patient Active Problem List   Diagnosis Date Noted  . CKD (chronic kidney disease) stage 3, GFR 30-59 ml/min 04/14/2016  . Hyperlipidemia 03/17/2016  . Fever 05/10/2014  . Nausea with vomiting 03/14/2014  . Mouth pain 11/17/2013  . Anxiety state 02/25/2013  . Lethargy 02/25/2013  . Osteoarthritis 02/04/2013  . Edema 02/04/2013  . Other convulsions 11/27/2012  . Unspecified constipation 11/27/2012  . History of pulmonary embolism 10/12/2012  . Long term current use of anticoagulant therapy 10/12/2012  . E. coli UTI 10/12/2012  . Essential hypertension, benign 10/12/2012  . Depression 10/12/2012  . Hypothyroidism 10/12/2012  . Dementia, multi-infarct 10/12/2012  . GERD (gastroesophageal reflux disease) 10/12/2012  . Psychosis 10/12/2012  . Hypokalemia 10/12/2012  . Hyponatremia 10/12/2012    CMP     Component Value Date/Time   NA 130 (A) 04/02/2016   K 4.7 04/02/2016   CL 98 08/28/2010 1845   CO2 31 08/28/2010 1845   GLUCOSE 89 08/28/2010 1845   BUN 19 04/02/2016   CREATININE 1.0 04/02/2016   CREATININE 1.0 08/28/2010 1845   CALCIUM 9.6 08/28/2010 1845   AST 22 04/02/2016   ALT 12 04/02/2016   ALKPHOS 67 04/02/2016   GFRNONAA 54 (L) 08/28/2010 1845   GFRAA  08/28/2010 1845    >60        The eGFR has been calculated using the MDRD equation. This calculation has not been validated in all clinical situations. eGFR's persistently <60 mL/min  signify possible Chronic Kidney Disease.    Recent Labs  04/02/16  NA 130*  K 4.7  BUN 19  CREATININE 1.0    Recent Labs  04/02/16  AST 22  ALT 12  ALKPHOS 67    Recent Labs  12/20/15 04/02/16  WBC 5.8 4.8  HGB 14.1 14.1  HCT 42 42  PLT 254 268    Recent Labs  04/02/16  CHOL 220*  LDLCALC 116  TRIG 61   No results found for: [facility] Lab Results  Component Value Date   TSH 1.47 04/02/2016   Lab Results  Component Value Date   HGBA1C 5.5 08/31/2016   Lab Results  Component Value Date   CHOL 220 (A) 04/02/2016   HDL 92 (A) 04/02/2016   LDLCALC 116 04/02/2016   TRIG 61 04/02/2016    Significant Diagnostic Results in last 30 days:  No results found.  Assessment and  Plan  History of pulmonary embolism Chronic and stable; cont xarelto 20 mg daily  Long term current use of anticoagulant therapy Pt continues on xarelto 20 mg daily as PE prophylaxis;no known conplications-cot current med  GERD (gastroesophageal reflux disease) No reports of reflux; cont pepcid 20 mg daily     Mitra Duling D. [name], MD

## 2016-11-14 ENCOUNTER — Encounter: Payer: Self-pay | Admitting: Internal Medicine

## 2016-11-14 ENCOUNTER — Non-Acute Institutional Stay (SKILLED_NURSING_FACILITY): Payer: Medicare Other | Admitting: Internal Medicine

## 2016-11-14 DIAGNOSIS — I10 Essential (primary) hypertension: Secondary | ICD-10-CM | POA: Diagnosis not present

## 2016-11-14 DIAGNOSIS — F0150 Vascular dementia without behavioral disturbance: Secondary | ICD-10-CM

## 2016-11-14 DIAGNOSIS — N183 Chronic kidney disease, stage 3 unspecified: Secondary | ICD-10-CM

## 2016-11-14 NOTE — Progress Notes (Signed)
Location:  [facility] Room Number: [phone] Place of Service:  SNF (31)  Tricia Duos, MD  Patient Care Team: Tricia Duos, MD as PCP - General (Internal Medicine)  Extended Emergency Contact Information Primary Emergency Contact: Tricia Martin,Tricia Martin  [place] Phone: [phone] Relation: Other    Allergies: Ciprofloxacin hcl; Codeine; Duloxetine hcl; Hctz [hydrochlorothiazide]; Metaxalone; Morphine and related; Olmesartan; Penicillins; Quetiapine fumarate; and Zofran [ondansetron hcl]  Chief Complaint  Patient presents with  . Medical Management of Chronic Issues    Routine Visit    HPI: Patient is 83 y.o. female who is being seen for routine issues of dementia, HTN, and CKD3.  Past Medical History:  Diagnosis Date  . Hyperlipidemia   . Hypertension   . Hypothyroid   . OA (osteoarthritis)   . PE (pulmonary embolism)     Past Surgical History:  Procedure Laterality Date  . JOINT REPLACEMENT     B knees    Allergies as of 11/14/2016      Reactions   Ciprofloxacin Hcl    Codeine    Duloxetine Hcl    Hctz [hydrochlorothiazide]    Metaxalone    Morphine And Related    Olmesartan    Penicillins    Quetiapine Fumarate    Zofran [ondansetron Hcl]       Medication List       Accurate as of 11/14/16 11:59 PM. Always use your most recent med list.          calcium-vitamin D 500-200 MG-UNIT tablet Commonly known as:  OSCAL WITH D Take 1 tablet by mouth 2 (two) times daily.   DAILY VITE Tabs Take 1 tablet by mouth daily.   docusate sodium 100 MG capsule Commonly known as:  COLACE Take 100 mg by mouth 2 (two) times daily. Hold for loose stool   famotidine 20 MG tablet Commonly known as:  PEPCID Take 20 mg by mouth daily. For reflux   fish oil-omega-3 fatty acids 1000 MG capsule Take 1 g by mouth 2 (two) times daily. For hyperlipidemia   levothyroxine 75 MCG tablet Commonly known as:   SYNTHROID, LEVOTHROID Take 75 mcg by mouth daily. For hypothyroidism   lisinopril 5 MG tablet Commonly known as:  PRINIVIL,ZESTRIL Take 5 mg by mouth daily.   memantine 10 MG tablet Commonly known as:  NAMENDA Take 10 mg by mouth 2 (two) times daily. For dementia   polyethylene glycol packet Commonly known as:  MIRALAX / GLYCOLAX Take 17 g by mouth daily. HOLD FOR LOOSE STOOLS   rivaroxaban 20 MG Tabs tablet Commonly known as:  XARELTO Take 20 mg by mouth daily with supper.   sodium chloride 1 g tablet Take 1 g by mouth 3 (three) times daily with meals. For sodium supplement   TYLENOL 8 HOUR ARTHRITIS PAIN 650 MG CR tablet Generic drug:  acetaminophen Take 650 mg by mouth every 12 (twelve) hours. For arthritis   venlafaxine XR 75 MG 24 hr capsule Commonly known as:  EFFEXOR-XR Take 75 mg by mouth daily with breakfast.       No orders of the defined types were placed in this encounter.   Immunization History  Administered Date(s) Administered  . Influenza-Unspecified 04/26/2013, 05/20/2014, 04/21/2015, 05/28/2016    Social History  Substance Use Topics  . Smoking status: Never Smoker  . Smokeless tobacco: Never Used  . Alcohol use No    Review of Systems  DATA OBTAINED: from  patient, nurse GENERAL:  no fevers, fatigue, appetite changes SKIN: No itching, rash HEENT: No complaint RESPIRATORY: No cough, wheezing, SOB CARDIAC: No chest pain, palpitations, lower extremity edema  GI: No abdominal pain, No N/V/D or constipation, No heartburn or reflux  GU: No dysuria, frequency or urgency, or incontinence  MUSCULOSKELETAL: No unrelieved bone/joint pain NEUROLOGIC: No headache, dizziness  PSYCHIATRIC: No overt anxiety or sadness  Vitals:   11/14/16 0939  BP: 131/76  Pulse: 86  Temp: 97.5 F (36.4 C)   Body mass index is 32.64 kg/m. Physical Exam  GENERAL APPEARANCE: Alert,  No acute distress  SKIN: No diaphoresis rash HEENT: Unremarkable RESPIRATORY:  Breathing is even, unlabored. Lung sounds are clear   CARDIOVASCULAR: Heart RRR no murmurs, rubs or gallops. No peripheral edema  GASTROINTESTINAL: Abdomen is soft, non-tender, not distended w/ normal bowel sounds.  GENITOURINARY: Bladder non tender, not distended  MUSCULOSKELETAL: No abnormal joints or musculature NEUROLOGIC: Cranial nerves 2-12 grossly intact. Moves all extremities PSYCHIATRIC: Mood and affect appropriate to situation with dementia, no behavioral issues  Patient Active Problem List   Diagnosis Date Noted  . CKD (chronic kidney disease) stage 3, GFR 30-59 ml/min 04/14/2016  . Hyperlipidemia 03/17/2016  . Fever 05/10/2014  . Nausea with vomiting 03/14/2014  . Mouth pain 11/17/2013  . Anxiety state 02/25/2013  . Lethargy 02/25/2013  . Osteoarthritis 02/04/2013  . Edema 02/04/2013  . Other convulsions 11/27/2012  . Unspecified constipation 11/27/2012  . History of pulmonary embolism 10/12/2012  . Long term current use of anticoagulant therapy 10/12/2012  . E. coli UTI 10/12/2012  . Essential hypertension, benign 10/12/2012  . Depression 10/12/2012  . Hypothyroidism 10/12/2012  . Dementia, multi-infarct 10/12/2012  . GERD (gastroesophageal reflux disease) 10/12/2012  . Psychosis 10/12/2012  . Hypokalemia 10/12/2012  . Hyponatremia 10/12/2012    CMP     Component Value Date/Time   NA 130 (A) 04/02/2016   K 4.7 04/02/2016   CL 98 08/28/2010 1845   CO2 31 08/28/2010 1845   GLUCOSE 89 08/28/2010 1845   BUN 19 04/02/2016   CREATININE 1.0 04/02/2016   CREATININE 1.0 08/28/2010 1845   CALCIUM 9.6 08/28/2010 1845   AST 22 04/02/2016   ALT 12 04/02/2016   ALKPHOS 67 04/02/2016   GFRNONAA 54 (L) 08/28/2010 1845   GFRAA  08/28/2010 1845    >60        The eGFR has been calculated using the MDRD equation. This calculation has not been validated in all clinical situations. eGFR's persistently <60 mL/min signify possible Chronic Kidney Disease.    Recent  Labs  04/02/16  NA 130*  K 4.7  BUN 19  CREATININE 1.0    Recent Labs  04/02/16  AST 22  ALT 12  ALKPHOS 67    Recent Labs  12/20/15 04/02/16  WBC 5.8 4.8  HGB 14.1 14.1  HCT 42 42  PLT 254 268    Recent Labs  04/02/16  CHOL 220*  LDLCALC 116  TRIG 61   No results found for: [facility] Lab Results  Component Value Date   TSH 1.47 04/02/2016   Lab Results  Component Value Date   HGBA1C 5.5 08/31/2016   Lab Results  Component Value Date   CHOL 220 (A) 04/02/2016   HDL 92 (A) 04/02/2016   LDLCALC 116 04/02/2016   TRIG 61 04/02/2016    Significant Diagnostic Results in last 30 days:  No results found.  Assessment and Plan  Dementia, multi-infarct Chronic and stable;  cont namenda 10 mg BID  Essential hypertension, benign Controlled ; cont lisinopril 5 mg daily  CKD (chronic kidney disease) stage 3, GFR 30-59 ml/min No recent GFR, most recent Cr 1.0 which is excellent for age;plan to monitor at intervals    Webb Silversmith D. [name], MD

## 2016-11-16 ENCOUNTER — Encounter: Payer: Self-pay | Admitting: Internal Medicine

## 2016-11-16 NOTE — Assessment & Plan Note (Signed)
No reports of reflux; cont pepcid 20 mg daily

## 2016-11-16 NOTE — Assessment & Plan Note (Signed)
Chronic and stable; cont xarelto 20 mg daily 

## 2016-11-16 NOTE — Assessment & Plan Note (Signed)
Pt continues on xarelto 20 mg daily as PE prophylaxis;no known conplications-cot current med

## 2016-12-08 ENCOUNTER — Encounter: Payer: Self-pay | Admitting: Internal Medicine

## 2016-12-08 NOTE — Assessment & Plan Note (Signed)
Chronic and stable; cont namenda 10 mg BID

## 2016-12-08 NOTE — Assessment & Plan Note (Signed)
Controlled  - cont lisinopril 5 mg daily

## 2016-12-08 NOTE — Assessment & Plan Note (Signed)
No recent GFR, most recent Cr 1.0 which is excellent for age;plan to monitor at intervals

## 2017-01-13 ENCOUNTER — Non-Acute Institutional Stay (SKILLED_NURSING_FACILITY): Payer: Medicare Other | Admitting: Internal Medicine

## 2017-01-13 ENCOUNTER — Encounter: Payer: Self-pay | Admitting: Internal Medicine

## 2017-01-13 DIAGNOSIS — F0150 Vascular dementia without behavioral disturbance: Secondary | ICD-10-CM

## 2017-01-13 DIAGNOSIS — F32A Depression, unspecified: Secondary | ICD-10-CM

## 2017-01-13 DIAGNOSIS — Z86711 Personal history of pulmonary embolism: Secondary | ICD-10-CM

## 2017-01-13 DIAGNOSIS — F329 Major depressive disorder, single episode, unspecified: Secondary | ICD-10-CM

## 2017-01-13 NOTE — Progress Notes (Signed)
Location:  [facility] [place] Room Number: [phone] Place of Service:  SNF (31)  [name], MD  Patient Care Team: [name], MD as PCP - General (Internal Medicine)  Extended Emergency Contact Information Primary Emergency Contact: Culclasure,[name]  [place] Phone: [phone] Relation: Other    Allergies: Ciprofloxacin hcl; Codeine; Duloxetine hcl; Hctz [hydrochlorothiazide]; Metaxalone; Morphine and related; Olmesartan; Penicillins; Quetiapine fumarate; and Zofran [ondansetron hcl]  Chief Complaint  Patient presents with  . Medical Management of Chronic Issues    routine visit    HPI: Patient is 83 y.o. female who Is being seen for routine issues of depression, history of PE, and dementia.  Past Medical History:  Diagnosis Date  . Chronic kidney disease, stage III (moderate)   . Gastroesophageal reflux disease without esophagitis   . Generalized anxiety disorder   . History of falling   . Hyperlipidemia   . Hypertension   . Hypo-osmolality and hyponatremia   . Hypokalemia   . Hypothyroid   . Major depressive disorder, single episode, unspecified   . OA (osteoarthritis)   . Old myocardial infarction   . PE (pulmonary embolism)   . Peripheral vascular disease, unspecified ([place])   . Vascular dementia without behavioral disturbance     Past Surgical History:  Procedure Laterality Date  . JOINT REPLACEMENT     B knees    Allergies as of 01/13/2017      Reactions   Ciprofloxacin Hcl    Codeine    Duloxetine Hcl    Hctz [hydrochlorothiazide]    Metaxalone    Morphine And Related    Olmesartan    Penicillins    Quetiapine Fumarate    Zofran [ondansetron Hcl]       Medication List       Accurate as of 01/13/17 11:59 PM. Always use your most recent med list.          calcium-vitamin D 500-200 MG-UNIT tablet Commonly known as:  OSCAL WITH D Take 1 tablet by mouth 2 (two) times daily.   DAILY  VITE Tabs Take 1 tablet by mouth daily.   docusate sodium 100 MG capsule Commonly known as:  COLACE Take 100 mg by mouth 2 (two) times daily. Hold for loose stool   famotidine 20 MG tablet Commonly known as:  PEPCID Take 20 mg by mouth daily. For reflux   fish oil-omega-3 fatty acids 1000 MG capsule Take 1 g by mouth 2 (two) times daily. For hyperlipidemia   levothyroxine 75 MCG tablet Commonly known as:  SYNTHROID, LEVOTHROID Take 75 mcg by mouth daily. For hypothyroidism   lisinopril 5 MG tablet Commonly known as:  PRINIVIL,ZESTRIL Take 5 mg by mouth daily.   memantine 10 MG tablet Commonly known as:  NAMENDA Take 10 mg by mouth 2 (two) times daily. For dementia   polyethylene glycol packet Commonly known as:  MIRALAX / GLYCOLAX Take 17 g by mouth daily. HOLD FOR LOOSE STOOLS   rivaroxaban 20 MG Tabs tablet Commonly known as:  XARELTO Take 20 mg by mouth daily with supper.   sodium chloride 1 g tablet Take 1 g by mouth 3 (three) times daily with meals. For sodium supplement   TYLENOL 8 HOUR ARTHRITIS PAIN 650 MG CR tablet Generic drug:  acetaminophen Take 650 mg by mouth every 12 (twelve) hours. For arthritis   venlafaxine XR 75 MG 24 hr capsule Commonly known as:  EFFEXOR-XR Take 75 mg by  mouth daily with breakfast.       No orders of the defined types were placed in this encounter.   Immunization History  Administered Date(s) Administered  . Influenza-Unspecified 04/26/2013, 05/20/2014, 04/21/2015, 05/28/2016    Social History  Substance Use Topics  . Smoking status: Never Smoker  . Smokeless tobacco: Never Used  . Alcohol use No    Review of Systems  DATA OBTAINED: from patient, nurse GENERAL:  no fevers, fatigue, appetite changes SKIN: No itching, rash HEENT: No complaint RESPIRATORY: No cough, wheezing, SOB CARDIAC: No chest pain, palpitations, lower extremity edema  GI: No abdominal pain, No N/V/D or constipation, No heartburn or reflux    GU: No dysuria, frequency or urgency, or incontinence  MUSCULOSKELETAL: No unrelieved bone/joint pain NEUROLOGIC: No headache, dizziness  PSYCHIATRIC: No overt anxiety or sadness  Vitals:   01/13/17 1237  BP: 131/76  Pulse: 84  Resp: (!) 22  Temp: 97.3 F (36.3 C)   Body mass index is 32.38 kg/m. Physical Exam  GENERAL APPEARANCE: Alert, conversant, No acute distress  SKIN: No diaphoresis rash HEENT: Unremarkable RESPIRATORY: Breathing is even, unlabored. Lung sounds are clear   CARDIOVASCULAR: Heart RRR no murmurs, rubs or gallops. No peripheral edema  GASTROINTESTINAL: Abdomen is soft, non-tender, not distended w/ normal bowel sounds.  GENITOURINARY: Bladder non tender, not distended  MUSCULOSKELETAL: No abnormal joints or musculature NEUROLOGIC: Cranial nerves 2-12 grossly intact. Moves all extremities PSYCHIATRIC: Mood and affect appropriate to situationWith some dementia, no behavioral issues  Patient Active Problem List   Diagnosis Date Noted  . Vascular dementia without behavioral disturbance 02/06/2017  . CKD (chronic kidney disease) stage 3, GFR 30-59 ml/min 04/14/2016  . Hyperlipidemia 03/17/2016  . Fever 05/10/2014  . Nausea with vomiting 03/14/2014  . Mouth pain 11/17/2013  . Anxiety state 02/25/2013  . Lethargy 02/25/2013  . Osteoarthritis 02/04/2013  . Edema 02/04/2013  . Other convulsions 11/27/2012  . Unspecified constipation 11/27/2012  . History of pulmonary embolism 10/12/2012  . Long term current use of anticoagulant therapy 10/12/2012  . E. coli UTI 10/12/2012  . Essential hypertension, benign 10/12/2012  . Depression 10/12/2012  . Hypothyroidism 10/12/2012  . Dementia, multi-infarct 10/12/2012  . GERD (gastroesophageal reflux disease) 10/12/2012  . Psychosis 10/12/2012  . Hypokalemia 10/12/2012  . Hyponatremia 10/12/2012    CMP     Component Value Date/Time   NA 130 (A) 04/02/2016   K 4.7 04/02/2016   CL 98 08/28/2010 1845   CO2 31  08/28/2010 1845   GLUCOSE 89 08/28/2010 1845   BUN 19 04/02/2016   CREATININE 1.0 04/02/2016   CREATININE 1.0 08/28/2010 1845   CALCIUM 9.6 08/28/2010 1845   AST 22 04/02/2016   ALT 12 04/02/2016   ALKPHOS 67 04/02/2016   GFRNONAA 54 (L) 08/28/2010 1845   GFRAA  08/28/2010 1845    >60        The eGFR has been calculated using the MDRD equation. This calculation has not been validated in all clinical situations. eGFR's persistently <60 mL/min signify possible Chronic Kidney Disease.    Recent Labs  04/02/16  NA 130*  K 4.7  BUN 19  CREATININE 1.0    Recent Labs  04/02/16  AST 22  ALT 12  ALKPHOS 67    Recent Labs  04/02/16  WBC 4.8  HGB 14.1  HCT 42  PLT 268    Recent Labs  04/02/16  CHOL 220*  LDLCALC 116  TRIG 61   No results found  for: [facility] Lab Results  Component Value Date   TSH 1.47 04/02/2016   Lab Results  Component Value Date   HGBA1C 5.5 08/31/2016   Lab Results  Component Value Date   CHOL 220 (A) 04/02/2016   HDL 92 (A) 04/02/2016   LDLCALC 116 04/02/2016   TRIG 61 04/02/2016    Significant Diagnostic Results in last 30 days:  No results found.  Assessment and Plan  Depression Chronic and stable; continue Effexor 75 mg by mouth daily  History of pulmonary embolism Patient is a relatively 20 mg by mouth daily as PE prophylaxis; plan to continue current management  Vascular dementia without behavioral disturbance Stable without declines; continue Namenda 10 mg by mouth twice a day     Webb Silversmith D. [name], MD

## 2017-02-06 ENCOUNTER — Encounter: Payer: Self-pay | Admitting: Internal Medicine

## 2017-02-06 DIAGNOSIS — F0150 Vascular dementia without behavioral disturbance: Secondary | ICD-10-CM | POA: Insufficient documentation

## 2017-02-06 NOTE — Assessment & Plan Note (Signed)
Chronic and stable; continue Effexor 75 mg by mouth daily

## 2017-02-06 NOTE — Assessment & Plan Note (Signed)
Patient is a relatively 20 mg by mouth daily as PE prophylaxis; plan to continue current management

## 2017-02-06 NOTE — Assessment & Plan Note (Signed)
Stable without declines; continue Namenda 10 mg by mouth twice a day

## 2017-02-10 LAB — CBC AND DIFFERENTIAL
HCT: 41 (ref 36–46)
Hemoglobin: 14.2 (ref 12.0–16.0)
Platelets: 177 (ref 150–399)
WBC: 4.1

## 2017-02-10 LAB — HEPATIC FUNCTION PANEL
ALT: 11 (ref 7–35)
AST: 20 (ref 13–35)
Alkaline Phosphatase: 75 (ref 25–125)
Bilirubin, Total: 0.3

## 2017-02-10 LAB — VITAMIN D 25 HYDROXY (VIT D DEFICIENCY, FRACTURES): VIT D 25 HYDROXY: 27.32

## 2017-02-10 LAB — BASIC METABOLIC PANEL
BUN: 19 (ref 4–21)
CREATININE: 0.8 (ref 0.5–1.1)
Glucose: 83
POTASSIUM: 4.1 (ref 3.4–5.3)
Sodium: 137 (ref 137–147)

## 2017-02-10 LAB — LIPID PANEL
Cholesterol: 210 — AB (ref 0–200)
HDL: 77 — AB (ref 35–70)
LDL Cholesterol: 123
TRIGLYCERIDES: 49 (ref 40–160)

## 2017-02-10 LAB — HEMOGLOBIN A1C: Hemoglobin A1C: 5.5

## 2017-02-10 LAB — TSH: TSH: 1.09 (ref 0.41–5.90)

## 2017-02-11 ENCOUNTER — Non-Acute Institutional Stay (SKILLED_NURSING_FACILITY): Payer: Medicare Other | Admitting: Internal Medicine

## 2017-02-11 ENCOUNTER — Encounter: Payer: Self-pay | Admitting: Internal Medicine

## 2017-02-11 DIAGNOSIS — K219 Gastro-esophageal reflux disease without esophagitis: Secondary | ICD-10-CM | POA: Diagnosis not present

## 2017-02-11 DIAGNOSIS — E785 Hyperlipidemia, unspecified: Secondary | ICD-10-CM | POA: Diagnosis not present

## 2017-02-11 DIAGNOSIS — E034 Atrophy of thyroid (acquired): Secondary | ICD-10-CM

## 2017-02-11 NOTE — Progress Notes (Signed)
Location:  [facility] [place] Room Number: [phone] Place of Service:  SNF (31)  [name], MD  Patient Care Team: [name], MD as PCP - General (Internal Medicine)  Extended Emergency Contact Information Primary Emergency Contact: Culclasure,[name]  [place] Phone: [phone] Relation: Other    Allergies: Ciprofloxacin hcl; Codeine; Duloxetine hcl; Hctz [hydrochlorothiazide]; Metaxalone; Morphine and related; Olmesartan; Penicillins; Quetiapine fumarate; and Zofran [ondansetron hcl]  Chief Complaint  Patient presents with  . Medical Management of Chronic Issues    routine visit    HPI: Patient is 83 y.o. female who Is being seen for routine issues of hyperlipidemia, hypothyroidism, and GERD.  Past Medical History:  Diagnosis Date  . Chronic kidney disease, stage III (moderate)   . CKD (chronic kidney disease) stage 3, GFR 30-59 ml/min 04/14/2016  . Dementia, multi-infarct 10/12/2012  . Depression 10/12/2012  . Gastroesophageal reflux disease without esophagitis   . Generalized anxiety disorder   . GERD (gastroesophageal reflux disease) 10/12/2012  . History of falling   . History of pulmonary embolism 10/12/2012  . Hyperlipidemia   . Hypertension   . Hypo-osmolality and hyponatremia   . Hypokalemia   . Hypothyroid   . Long term current use of anticoagulant therapy 10/12/2012  . Major depressive disorder, single episode, unspecified   . OA (osteoarthritis)   . Old myocardial infarction   . Osteoarthritis 02/04/2013  . PE (pulmonary embolism)   . Peripheral vascular disease, unspecified ([place])   . Vascular dementia without behavioral disturbance     Past Surgical History:  Procedure Laterality Date  . JOINT REPLACEMENT     B knees    Allergies as of 02/11/2017      Reactions   Ciprofloxacin Hcl    Codeine    Duloxetine Hcl    Hctz [hydrochlorothiazide]    Metaxalone    Morphine And Related    Olmesartan    Penicillins    Quetiapine Fumarate    Zofran [ondansetron Hcl]       Medication List       Accurate as of 02/11/17 11:59 PM. Always use your most recent med list.          calcium-vitamin D 500-200 MG-UNIT tablet Commonly known as:  OSCAL WITH D Take 1 tablet by mouth 2 (two) times daily.   DAILY VITE Tabs Take 1 tablet by mouth daily.   docusate sodium 100 MG capsule Commonly known as:  COLACE Take 100 mg by mouth 2 (two) times daily. Hold for loose stool   famotidine 20 MG tablet Commonly known as:  PEPCID Take 20 mg by mouth daily. For reflux   fish oil-omega-3 fatty acids 1000 MG capsule Take 1 g by mouth 2 (two) times daily. For hyperlipidemia   levothyroxine 75 MCG tablet Commonly known as:  SYNTHROID, LEVOTHROID Take 75 mcg by mouth daily. For hypothyroidism   lisinopril 5 MG tablet Commonly known as:  PRINIVIL,ZESTRIL Take 5 mg by mouth daily.   memantine 10 MG tablet Commonly known as:  NAMENDA Take 10 mg by mouth 2 (two) times daily. For dementia   polyethylene glycol packet Commonly known as:  MIRALAX / GLYCOLAX Take 17 g by mouth daily. HOLD FOR LOOSE STOOLS   rivaroxaban 20 MG Tabs tablet Commonly known as:  XARELTO Take 20 mg by mouth daily with supper.   sodium chloride 1 g tablet Take 1 g by mouth 3 (three) times daily with meals. For  sodium supplement   TYLENOL 8 HOUR ARTHRITIS PAIN 650 MG CR tablet Generic drug:  acetaminophen Take 650 mg by mouth every 12 (twelve) hours. For arthritis   venlafaxine XR 75 MG 24 hr capsule Commonly known as:  EFFEXOR-XR Take 75 mg by mouth daily with breakfast.       No orders of the defined types were placed in this encounter.   Immunization History  Administered Date(s) Administered  . Influenza-Unspecified 04/26/2013, 05/20/2014, 04/21/2015, 05/28/2016    Social History  Substance Use Topics  . Smoking status: Never Smoker  . Smokeless tobacco: Never Used  . Alcohol use No      Review of Systems  DATA OBTAINED: from patient, nurse GENERAL:  no fevers, fatigue, appetite changes SKIN: No itching, rash HEENT: No complaint RESPIRATORY: No cough, wheezing, SOB CARDIAC: No chest pain, palpitations, lower extremity edema  GI: No abdominal pain, No N/V/D or constipation, No heartburn or reflux  GU: No dysuria, frequency or urgency, or incontinence  MUSCULOSKELETAL: No unrelieved bone/joint pain NEUROLOGIC: No headache, dizziness  PSYCHIATRIC: No overt anxiety or sadness  Vitals:   02/11/17 1150  BP: 131/76  Pulse: 70  Resp: 20  Temp: (!) 97.3 F (36.3 C)   Body mass index is 32.38 kg/m. Physical Exam  GENERAL APPEARANCE: Alert, conversant, No acute distress  SKIN: No diaphoresis rash HEENT: Unremarkable RESPIRATORY: Breathing is even, unlabored. Lung sounds are clear   CARDIOVASCULAR: Heart RRR no murmurs, rubs or gallops. No peripheral edema  GASTROINTESTINAL: Abdomen is soft, non-tender, not distended w/ normal bowel sounds.  GENITOURINARY: Bladder non tender, not distended  MUSCULOSKELETAL: No abnormal joints or musculature NEUROLOGIC: Cranial nerves 2-12 grossly intact. Moves all extremities PSYCHIATRIC: Mood and affect appropriate With some dementia, no behavioral issues  Patient Active Problem List   Diagnosis Date Noted  . Vascular dementia without behavioral disturbance 02/06/2017  . CKD (chronic kidney disease) stage 3, GFR 30-59 ml/min 04/14/2016  . Hyperlipidemia 03/17/2016  . Fever 05/10/2014  . Nausea with vomiting 03/14/2014  . Mouth pain 11/17/2013  . Anxiety state 02/25/2013  . Lethargy 02/25/2013  . Osteoarthritis 02/04/2013  . Edema 02/04/2013  . Other convulsions 11/27/2012  . Unspecified constipation 11/27/2012  . History of pulmonary embolism 10/12/2012  . Long term current use of anticoagulant therapy 10/12/2012  . E. coli UTI 10/12/2012  . Essential hypertension, benign 10/12/2012  . Depression 10/12/2012  .  Hypothyroidism 10/12/2012  . Dementia, multi-infarct 10/12/2012  . GERD (gastroesophageal reflux disease) 10/12/2012  . Psychosis 10/12/2012  . Hypokalemia 10/12/2012  . Hyponatremia 10/12/2012    CMP     Component Value Date/Time   NA 137 02/10/2017   K 4.1 02/10/2017   CL 98 08/28/2010 1845   CO2 31 08/28/2010 1845   GLUCOSE 89 08/28/2010 1845   BUN 19 02/10/2017   CREATININE 0.8 02/10/2017   CREATININE 1.0 08/28/2010 1845   CALCIUM 9.6 08/28/2010 1845   AST 20 02/10/2017   ALT 11 02/10/2017   ALKPHOS 75 02/10/2017   GFRNONAA 54 (L) 08/28/2010 1845   GFRAA  08/28/2010 1845    >60        The eGFR has been calculated using the MDRD equation. This calculation has not been validated in all clinical situations. eGFR's persistently <60 mL/min signify possible Chronic Kidney Disease.    Recent Labs  04/02/16 02/10/17  NA 130* 137  K 4.7 4.1  BUN 19 19  CREATININE 1.0 0.8    Recent Labs  04/02/16  02/10/17  AST 22 20  ALT 12 11  ALKPHOS 67 75    Recent Labs  04/02/16 02/10/17  WBC 4.8 4.1  HGB 14.1 14.2  HCT 42 41  PLT 268 177    Recent Labs  04/02/16 02/10/17  CHOL 220* 210*  LDLCALC 116 123  TRIG 61 49   No results found for: [facility] Lab Results  Component Value Date   TSH 1.09 02/10/2017   Lab Results  Component Value Date   HGBA1C 5.5 02/10/2017   Lab Results  Component Value Date   CHOL 210 (A) 02/10/2017   HDL 77 (A) 02/10/2017   LDLCALC 123 02/10/2017   TRIG 49 02/10/2017    Significant Diagnostic Results in last 30 days:  No results found.  Assessment and Plan  Hyperlipidemia Recent LDL is 123, HDL 77, triglycerides 49; plan to continue omega-3 1 g by mouth daily  Hypothyroidism Recent TSH is 1.09; controlled; plan to continue Synthroid 75 g by mouth daily  GERD (gastroesophageal reflux disease) No reports of reflux or aspiration; continue Pepcid 20 mg by mouth daily    Makaylia Hewett D. [name], MD

## 2017-02-12 ENCOUNTER — Non-Acute Institutional Stay (SKILLED_NURSING_FACILITY): Payer: Medicare Other

## 2017-02-12 DIAGNOSIS — Z0000 Encounter for general adult medical examination without abnormal findings: Secondary | ICD-10-CM | POA: Diagnosis not present

## 2017-02-12 NOTE — Patient Instructions (Signed)
Tricia Martin , Thank you for taking time to come for your Medicare Wellness Visit. I appreciate your ongoing commitment to your health goals. Please review the following plan we discussed and let me know if I can assist you in the future.   Screening recommendations/referrals: Colonoscopy excluded, pt over age 81 Mammogram excluded, pt over age 81 Bone Density due, ordered Recommended yearly ophthalmology/optometry visit for glaucoma screening and checkup Recommended yearly dental visit for hygiene and checkup  Vaccinations: Influenza vaccine up to date. Due 05/28/2017 Pneumococcal vaccine 13 due Tdap vaccine due, ordered Shingles vaccine not in records  Advanced directives: Need copy for chart  Conditions/risks identified: None  Next appointment: Dr. Lyn HollingsheadAlexander makes rounds   Preventive Care 65 Years and Older, Female Preventive care refers to lifestyle choices and visits with your health care provider that can promote health and wellness. What does preventive care include?  A yearly physical exam. This is also called an annual well check.  Dental exams once or twice a year.  Routine eye exams. Ask your health care provider how often you should have your eyes checked.  Personal lifestyle choices, including:  Daily care of your teeth and gums.  Regular physical activity.  Eating a healthy diet.  Avoiding tobacco and drug use.  Limiting alcohol use.  Practicing safe sex.  Taking low-dose aspirin every day.  Taking vitamin and mineral supplements as recommended by your health care provider. What happens during an annual well check? The services and screenings done by your health care provider during your annual well check will depend on your age, overall health, lifestyle risk factors, and family history of disease. Counseling  Your health care provider may ask you questions about your:  Alcohol use.  Tobacco use.  Drug use.  Emotional well-being.  Home and  relationship well-being.  Sexual activity.  Eating habits.  History of falls.  Memory and ability to understand (cognition).  Work and work Astronomerenvironment.  Reproductive health. Screening  You may have the following tests or measurements:  Height, weight, and BMI.  Blood pressure.  Lipid and cholesterol levels. These may be checked every 5 years, or more frequently if you are over 81 years old.  Skin check.  Lung cancer screening. You may have this screening every year starting at age 81 if you have a 30-pack-year history of smoking and currently smoke or have quit within the past 15 years.  Fecal occult blood test (FOBT) of the stool. You may have this test every year starting at age 81.  Flexible sigmoidoscopy or colonoscopy. You may have a sigmoidoscopy every 5 years or a colonoscopy every 10 years starting at age 81.  Hepatitis C blood test.  Hepatitis B blood test.  Sexually transmitted disease (STD) testing.  Diabetes screening. This is done by checking your blood sugar (glucose) after you have not eaten for a while (fasting). You may have this done every 1-3 years.  Bone density scan. This is done to screen for osteoporosis. You may have this done starting at age 81.  Mammogram. This may be done every 1-2 years. Talk to your health care provider about how often you should have regular mammograms. Talk with your health care provider about your test results, treatment options, and if necessary, the need for more tests. Vaccines  Your health care provider may recommend certain vaccines, such as:  Influenza vaccine. This is recommended every year.  Tetanus, diphtheria, and acellular pertussis (Tdap, Td) vaccine. You may need a Td  booster every 10 years.  Zoster vaccine. You may need this after age 59.  Pneumococcal 13-valent conjugate (PCV13) vaccine. One dose is recommended after age 22.  Pneumococcal polysaccharide (PPSV23) vaccine. One dose is recommended after  age 35. Talk to your health care provider about which screenings and vaccines you need and how often you need them. This information is not intended to replace advice given to you by your health care provider. Make sure you discuss any questions you have with your health care provider. Document Released: 08/04/2015 Document Revised: 03/27/2016 Document Reviewed: 05/09/2015 Elsevier Interactive Patient Education  2017 [place] Prevention in the Home Falls can cause injuries. They can happen to people of all ages. There are many things you can do to make your home safe and to help prevent falls. What can I do on the outside of my home?  Regularly fix the edges of walkways and driveways and fix any cracks.  Remove anything that might make you trip as you walk through a door, such as a raised step or threshold.  Trim any bushes or trees on the path to your home.  Use bright outdoor lighting.  Clear any walking paths of anything that might make someone trip, such as rocks or tools.  Regularly check to see if handrails are loose or broken. Make sure that both sides of any steps have handrails.  Any raised decks and porches should have guardrails on the edges.  Have any leaves, snow, or ice cleared regularly.  Use sand or salt on walking paths during winter.  Clean up any spills in your garage right away. This includes oil or grease spills. What can I do in the bathroom?  Use night lights.  Install grab bars by the toilet and in the tub and shower. Do not use towel bars as grab bars.  Use non-skid mats or decals in the tub or shower.  If you need to sit down in the shower, use a plastic, non-slip stool.  Keep the floor dry. Clean up any water that spills on the floor as soon as it happens.  Remove soap buildup in the tub or shower regularly.  Attach bath mats securely with double-sided non-slip rug tape.  Do not have throw rugs and other things on the floor that can  make you trip. What can I do in the bedroom?  Use night lights.  Make sure that you have a light by your bed that is easy to reach.  Do not use any sheets or blankets that are too big for your bed. They should not hang down onto the floor.  Have a firm chair that has side arms. You can use this for support while you get dressed.  Do not have throw rugs and other things on the floor that can make you trip. What can I do in the kitchen?  Clean up any spills right away.  Avoid walking on wet floors.  Keep items that you use a lot in easy-to-reach places.  If you need to reach something above you, use a strong step stool that has a grab bar.  Keep electrical cords out of the way.  Do not use floor polish or wax that makes floors slippery. If you must use wax, use non-skid floor wax.  Do not have throw rugs and other things on the floor that can make you trip. What can I do with my stairs?  Do not leave any items on the stairs.  Make sure that there are handrails on both sides of the stairs and use them. Fix handrails that are broken or loose. Make sure that handrails are as long as the stairways.  Check any carpeting to make sure that it is firmly attached to the stairs. Fix any carpet that is loose or worn.  Avoid having throw rugs at the top or bottom of the stairs. If you do have throw rugs, attach them to the floor with carpet tape.  Make sure that you have a light switch at the top of the stairs and the bottom of the stairs. If you do not have them, ask someone to add them for you. What else can I do to help prevent falls?  Wear shoes that:  Do not have high heels.  Have rubber bottoms.  Are comfortable and fit you well.  Are closed at the toe. Do not wear sandals.  If you use a stepladder:  Make sure that it is fully opened. Do not climb a closed stepladder.  Make sure that both sides of the stepladder are locked into place.  Ask someone to hold it for you,  if possible.  Clearly mark and make sure that you can see:  Any grab bars or handrails.  First and last steps.  Where the edge of each step is.  Use tools that help you move around (mobility aids) if they are needed. These include:  Canes.  Walkers.  Scooters.  Crutches.  Turn on the lights when you go into a dark area. Replace any light bulbs as soon as they burn out.  Set up your furniture so you have a clear path. Avoid moving your furniture around.  If any of your floors are uneven, fix them.  If there are any pets around you, be aware of where they are.  Review your medicines with your doctor. Some medicines can make you feel dizzy. This can increase your chance of falling. Ask your doctor what other things that you can do to help prevent falls. This information is not intended to replace advice given to you by your health care provider. Make sure you discuss any questions you have with your health care provider. Document Released: 05/04/2009 Document Revised: 12/14/2015 Document Reviewed: 08/12/2014 Elsevier Interactive Patient Education  2017 [employer].

## 2017-02-12 NOTE — Progress Notes (Signed)
Subjective:   Tricia PentonBetty Mckown is a 83 y.o. female who presents for an Initial Medicare Annual Wellness Visit at [facility]        Objective:    Today's Vitals   02/12/17 1506  BP: 118/70  Pulse: 70  Temp: 97.9 F (36.6 C)  TempSrc: Oral  SpO2: 97%  Weight: 201 lb (91.2 kg)  Height: 5\' 6"  (1.676 m)   Body mass index is 32.44 kg/m.   Current Medications (verified) Outpatient Encounter Prescriptions as of 02/12/2017  Medication Sig  . acetaminophen (TYLENOL 8 HOUR ARTHRITIS PAIN) 650 MG CR tablet Take 650 mg by mouth every 12 (twelve) hours. For arthritis   . calcium-vitamin D (OSCAL WITH D) 500-200 MG-UNIT tablet Take 1 tablet by mouth 2 (two) times daily.  Marland Kitchen. docusate sodium (COLACE) 100 MG capsule Take 100 mg by mouth 2 (two) times daily. Hold for loose stool  . famotidine (PEPCID) 20 MG tablet Take 20 mg by mouth daily. For reflux  . fish oil-omega-3 fatty acids 1000 MG capsule Take 1 g by mouth 2 (two) times daily. For hyperlipidemia  . levothyroxine (SYNTHROID, LEVOTHROID) 75 MCG tablet Take 75 mcg by mouth daily. For hypothyroidism  . lisinopril (PRINIVIL,ZESTRIL) 5 MG tablet Take 5 mg by mouth daily.  . memantine (NAMENDA) 10 MG tablet Take 10 mg by mouth 2 (two) times daily. For dementia  . Multiple Vitamin (DAILY VITE) TABS Take 1 tablet by mouth daily.  . polyethylene glycol (MIRALAX / GLYCOLAX) packet Take 17 g by mouth daily. HOLD FOR LOOSE STOOLS  . rivaroxaban (XARELTO) 20 MG TABS tablet Take 20 mg by mouth daily with supper.   . sodium chloride 1 G tablet Take 1 g by mouth 3 (three) times daily with meals. For sodium supplement  . venlafaxine XR (EFFEXOR-XR) 75 MG 24 hr capsule Take 75 mg by mouth daily with breakfast.  . Vitamin D, Ergocalciferol, (DRISDOL) 50000 units CAPS capsule Take 50,000 Units by mouth every 7 (seven) days.   No facility-administered encounter medications on file as of 02/12/2017.     Allergies (verified) Ciprofloxacin  hcl; Codeine; Duloxetine hcl; Hctz [hydrochlorothiazide]; Metaxalone; Morphine and related; Olmesartan; Penicillins; Quetiapine fumarate; and Zofran [ondansetron hcl]   History: Past Medical History:  Diagnosis Date  . Chronic kidney disease, stage III (moderate)   . CKD (chronic kidney disease) stage 3, GFR 30-59 ml/min 04/14/2016  . Dementia, multi-infarct 10/12/2012  . Depression 10/12/2012  . Gastroesophageal reflux disease without esophagitis   . Generalized anxiety disorder   . GERD (gastroesophageal reflux disease) 10/12/2012  . History of falling   . History of pulmonary embolism 10/12/2012  . Hyperlipidemia   . Hypertension   . Hypo-osmolality and hyponatremia   . Hypokalemia   . Hypothyroid   . Long term current use of anticoagulant therapy 10/12/2012  . Major depressive disorder, single episode, unspecified   . OA (osteoarthritis)   . Old myocardial infarction   . Osteoarthritis 02/04/2013  . PE (pulmonary embolism)   . Peripheral vascular disease, unspecified (HCC)   . Vascular dementia without behavioral disturbance    Past Surgical History:  Procedure Laterality Date  . JOINT REPLACEMENT     B knees   History reviewed. No pertinent family history. Social History   Occupational History  . 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 Smoking status: Never Smoker  . Smokeless tobacco: Never Used  . Alcohol use No  . Drug use:  No  . Sexual activity: No    Tobacco Counseling Counseling given: Not Answered   Activities of Daily Living In your present state of health, do you have any difficulty performing the following activities: 02/12/2017  Hearing? Y  Vision? N  Difficulty concentrating or making decisions? Y  Walking or climbing stairs? Y  Dressing or bathing? Y  Doing errands, shopping? Y  Preparing Food and eating ? Y  Using the Toilet? Y  In the past six months, have you accidently leaked urine? Y  Do you have problems with loss of bowel  control? Y  Managing your Medications? Y  Managing your Finances? Y  Housekeeping or managing your Housekeeping? Y  Some recent data might be hidden    Immunizations and Health Maintenance Immunization History  Administered Date(s) Administered  . Influenza-Unspecified 04/26/2013, 05/20/2014, 04/21/2015, 05/28/2016   Health Maintenance Due  Topic Date Due  . DEXA SCAN  11/02/1997  . PNA vac Low Risk Adult (1 of 2 - PCV13) 11/02/1997    Patient Care Team: Margit HanksAlexander, Anne D, MD as PCP - General (Internal Medicine)  Indicate any recent Medical Services you may have received from other than [facility] providers in the past year (date may be approximate).     Assessment:   This is a routine wellness examination for Tricia RhodesBetty.   Hearing/Vision screen No exam data present  Dietary issues and exercise activities discussed: Current Exercise Habits: The patient does not participate in regular exercise at present, Exercise limited by: neurologic condition(s)  Goals    None     Depression Screen PHQ 2/9 Scores 02/12/2017  PHQ - 2 Score 0    Fall Risk Fall Risk  02/12/2017  Falls in the past year? No    Cognitive Function:     6CIT Screen 02/12/2017  What Year? 4 points  What month? 3 points  What time? 3 points  Count back from 20 0 points  Months in reverse 0 points  Repeat phrase 0 points  Total Score 10    Screening Tests Health Maintenance  Topic Date Due  . DEXA SCAN  11/02/1997  . PNA vac Low Risk Adult (1 of 2 - PCV13) 11/02/1997  . TETANUS/TDAP  12/13/2025 (Originally 11/03/1949)  . INFLUENZA VACCINE  02/19/2017      Plan:    I have personally reviewed and addressed the Medicare Annual Wellness questionnaire and have noted the following in the patient's chart:  A. Medical and social history B. Use of alcohol, tobacco or illicit drugs  C. Current medications and supplements D. Functional ability and status E.  Nutritional status F.  Physical  activity G. Advance directives H. List of other physicians I.  Hospitalizations, surgeries, and ER visits in previous 12 months J.  Vitals K. Screenings to include hearing, vision, cognitive, depression L. Referrals and appointments - none  In addition, I have reviewed and discussed with patient certain preventive protocols, quality metrics, and best practice recommendations. A written personalized care plan for preventive services as well as general preventive health recommendations were provided to patient.  See attached scanned questionnaire for additional information.   Signed,   Annetta MawSara Gonthier, RN Nurse Health Advisor   Quick Notes   Health Maintenance: DEXA, [phone] TDAP due     Abnormal Screen: 6 CIT-10     Patient Concerns: None     Nurse Concerns: None

## 2017-03-09 ENCOUNTER — Encounter: Payer: Self-pay | Admitting: Internal Medicine

## 2017-03-09 NOTE — Assessment & Plan Note (Signed)
No reports of reflux or aspiration; continue Pepcid 20 mg by mouth daily

## 2017-03-09 NOTE — Assessment & Plan Note (Signed)
Recent LDL is 123, HDL 77, triglycerides 49; plan to continue omega-3 1 g by mouth daily

## 2017-03-09 NOTE — Assessment & Plan Note (Signed)
Recent TSH is 1.09; controlled; plan to continue Synthroid 75 g by mouth daily

## 2017-03-14 ENCOUNTER — Encounter: Payer: Self-pay | Admitting: Internal Medicine

## 2017-03-14 ENCOUNTER — Non-Acute Institutional Stay (SKILLED_NURSING_FACILITY): Payer: Medicare Other | Admitting: Internal Medicine

## 2017-03-14 DIAGNOSIS — F0150 Vascular dementia without behavioral disturbance: Secondary | ICD-10-CM | POA: Diagnosis not present

## 2017-03-14 DIAGNOSIS — I10 Essential (primary) hypertension: Secondary | ICD-10-CM

## 2017-03-14 DIAGNOSIS — I739 Peripheral vascular disease, unspecified: Secondary | ICD-10-CM

## 2017-03-14 NOTE — Progress Notes (Signed)
Location:  [facility] Room Number: [phone] Place of Service:  SNF (31)  [name], MD  Patient Care Team: [name], MD as PCP - General (Internal Medicine)  Extended Emergency Contact Information Primary Emergency Contact: Culclasure,[name]  [place] Phone: [phone] Relation: Other    Allergies: Ciprofloxacin hcl; Codeine; Duloxetine hcl; Hctz [hydrochlorothiazide]; Metaxalone; Morphine and related; Olmesartan; Penicillins; Quetiapine fumarate; and Zofran [ondansetron hcl]  Chief Complaint  Patient presents with  . Medical Management of Chronic Issues    routine visit    HPI: Patient is 83 y.o. female who Is being seen for routine issues of dementia, hypertension, and peripheral vascular disease.  Past Medical History:  Diagnosis Date  . Chronic kidney disease, stage III (moderate)   . CKD (chronic kidney disease) stage 3, GFR 30-59 ml/min 04/14/2016  . Dementia, multi-infarct 10/12/2012  . Depression 10/12/2012  . Gastroesophageal reflux disease without esophagitis   . Generalized anxiety disorder   . GERD (gastroesophageal reflux disease) 10/12/2012  . History of falling   . History of pulmonary embolism 10/12/2012  . Hyperlipidemia   . Hypertension   . Hypo-osmolality and hyponatremia   . Hypokalemia   . Hypothyroid   . Long term current use of anticoagulant therapy 10/12/2012  . Major depressive disorder, single episode, unspecified   . OA (osteoarthritis)   . Old myocardial infarction   . Osteoarthritis 02/04/2013  . PE (pulmonary embolism)   . Peripheral vascular disease, unspecified ([place])   . Vascular dementia without behavioral disturbance     Past Surgical History:  Procedure Laterality Date  . JOINT REPLACEMENT     B knees    Allergies as of 03/14/2017      Reactions   Ciprofloxacin Hcl    Codeine    Duloxetine Hcl    Hctz [hydrochlorothiazide]    Metaxalone    Morphine And  Related    Olmesartan    Penicillins    Quetiapine Fumarate    Zofran [ondansetron Hcl]       Medication List       Accurate as of 03/14/17 11:59 PM. Always use your most recent med list.          calcium-vitamin D 500-200 MG-UNIT tablet Commonly known as:  OSCAL WITH D Take 1 tablet by mouth 2 (two) times daily.   DAILY VITE Tabs Take 1 tablet by mouth daily.   docusate sodium 100 MG capsule Commonly known as:  COLACE Take 100 mg by mouth 2 (two) times daily. Hold for loose stool   famotidine 20 MG tablet Commonly known as:  PEPCID Take 20 mg by mouth daily. For reflux   fish oil-omega-3 fatty acids 1000 MG capsule Take 1 g by mouth 2 (two) times daily. For hyperlipidemia   levothyroxine 75 MCG tablet Commonly known as:  SYNTHROID, LEVOTHROID Take 75 mcg by mouth daily. For hypothyroidism   lisinopril 5 MG tablet Commonly known as:  PRINIVIL,ZESTRIL Take 5 mg by mouth daily.   memantine 10 MG tablet Commonly known as:  NAMENDA Take 10 mg by mouth 2 (two) times daily. For dementia   polyethylene glycol packet Commonly known as:  MIRALAX / GLYCOLAX Take 17 g by mouth daily. HOLD FOR LOOSE STOOLS   rivaroxaban 20 MG Tabs tablet Commonly known as:  XARELTO Take 20 mg by mouth daily with supper.   sodium chloride 1 g tablet Take 1 g by mouth 3 (three) times daily  with meals. For sodium supplement   TYLENOL 8 HOUR ARTHRITIS PAIN 650 MG CR tablet Generic drug:  acetaminophen Take 650 mg by mouth every 12 (twelve) hours. For arthritis   venlafaxine XR 75 MG 24 hr capsule Commonly known as:  EFFEXOR-XR Take 75 mg by mouth daily with breakfast.   Vitamin D (Ergocalciferol) 50000 units Caps capsule Commonly known as:  DRISDOL Take 50,000 Units by mouth every 7 (seven) days.       No orders of the defined types were placed in this encounter.   Immunization History  Administered Date(s) Administered  . Influenza-Unspecified 04/26/2013, 05/20/2014,  04/21/2015, 05/28/2016    Social History  Substance Use Topics  . Smoking status: Never Smoker  . Smokeless tobacco: Never Used  . Alcohol use No    Review of Systems  DATA OBTAINED: from patient, nurse GENERAL:  no fevers, fatigue, appetite changes SKIN: No itching, rash HEENT: No complaint RESPIRATORY: No cough, wheezing, SOB CARDIAC: No chest pain, palpitations, lower extremity edema  GI: No abdominal pain, No N/V/D or constipation, No heartburn or reflux  GU: No dysuria, frequency or urgency, or incontinence  MUSCULOSKELETAL: No unrelieved bone/joint pain NEUROLOGIC: No headache, dizziness  PSYCHIATRIC: No overt anxiety or sadness  Vitals:   03/14/17 1330  BP: 134/68  Pulse: 74  Resp: 18  Temp: 98 F (36.7 C)   Body mass index is 32.6 kg/m. Physical Exam  GENERAL APPEARANCE: Alert, conversant, No acute distress  SKIN: No diaphoresis rash HEENT: Unremarkable RESPIRATORY: Breathing is even, unlabored. Lung sounds are clear   CARDIOVASCULAR: Heart RRR no murmurs, rubs or gallops. No peripheral edema  GASTROINTESTINAL: Abdomen is soft, non-tender, not distended w/ normal bowel sounds.  GENITOURINARY: Bladder non tender, not distended  MUSCULOSKELETAL: No abnormal joints or musculature NEUROLOGIC: Cranial nerves 2-12 grossly intact. Moves all extremities PSYCHIATRIC: Mood and affect appropriate to situationWith some dementia, no behavioral issues  Patient Active Problem List   Diagnosis Date Noted  . Peripheral vascular disease, unspecified (HCC) 04/05/2017  . Vascular dementia without behavioral disturbance 02/06/2017  . CKD (chronic kidney disease) stage 3, GFR 30-59 ml/min 04/14/2016  . Hyperlipidemia 03/17/2016  . Fever 05/10/2014  . Nausea with vomiting 03/14/2014  . Mouth pain 11/17/2013  . Anxiety state 02/25/2013  . Lethargy 02/25/2013  . Osteoarthritis 02/04/2013  . Edema 02/04/2013  . Other convulsions 11/27/2012  . Unspecified constipation  11/27/2012  . History of pulmonary embolism 10/12/2012  . Long term current use of anticoagulant therapy 10/12/2012  . E. coli UTI 10/12/2012  . Essential hypertension, benign 10/12/2012  . Depression 10/12/2012  . Hypothyroidism 10/12/2012  . Dementia, multi-infarct 10/12/2012  . GERD (gastroesophageal reflux disease) 10/12/2012  . Psychosis 10/12/2012  . Hypokalemia 10/12/2012  . Hyponatremia 10/12/2012    CMP     Component Value Date/Time   NA 137 02/10/2017   K 4.1 02/10/2017   CL 98 08/28/2010 1845   CO2 31 08/28/2010 1845   GLUCOSE 89 08/28/2010 1845   BUN 19 02/10/2017   CREATININE 0.8 02/10/2017   CREATININE 1.0 08/28/2010 1845   CALCIUM 9.6 08/28/2010 1845   AST 20 02/10/2017   ALT 11 02/10/2017   ALKPHOS 75 02/10/2017   GFRNONAA 54 (L) 08/28/2010 1845   GFRAA  08/28/2010 1845    >60        The eGFR has been calculated using the MDRD equation. This calculation has not been validated in all clinical situations. eGFR's persistently <60 mL/min signify possible Chronic  Kidney Disease.    Recent Labs  02/10/17  NA 137  K 4.1  BUN 19  CREATININE 0.8    Recent Labs  02/10/17  AST 20  ALT 11  ALKPHOS 75    Recent Labs  02/10/17  WBC 4.1  HGB 14.2  HCT 41  PLT 177    Recent Labs  02/10/17  CHOL 210*  LDLCALC 123  TRIG 49   No results found for: [facility] Lab Results  Component Value Date   TSH 1.09 02/10/2017   Lab Results  Component Value Date   HGBA1C 5.5 02/10/2017   Lab Results  Component Value Date   CHOL 210 (A) 02/10/2017   HDL 77 (A) 02/10/2017   LDLCALC 123 02/10/2017   TRIG 49 02/10/2017    Significant Diagnostic Results in last 30 days:  No results found.  Assessment and Plan  Dementia, multi-infarct Stable; continue Namenda 10 mg by mouth twice a day  Essential hypertension, benign Control; plan to continue lisinopril 5 mg by mouth daily  Peripheral vascular disease, unspecified ([place]) No reported lesions  or ulcers skin breakdowns; continue xarelto, also use for history of PE     Reyli Schroth D. [name], MD

## 2017-04-05 ENCOUNTER — Encounter: Payer: Self-pay | Admitting: Internal Medicine

## 2017-04-05 DIAGNOSIS — I739 Peripheral vascular disease, unspecified: Secondary | ICD-10-CM | POA: Insufficient documentation

## 2017-04-05 NOTE — Assessment & Plan Note (Signed)
Control; plan to continue lisinopril 5 mg by mouth daily

## 2017-04-05 NOTE — Assessment & Plan Note (Signed)
No reported lesions or ulcers skin breakdowns; continue xarelto, also use for history of PE

## 2017-04-05 NOTE — Assessment & Plan Note (Signed)
Stable; continue Namenda 10 mg by mouth twice a day

## 2017-04-10 ENCOUNTER — Encounter: Payer: Self-pay | Admitting: Internal Medicine

## 2017-04-10 ENCOUNTER — Non-Acute Institutional Stay (SKILLED_NURSING_FACILITY): Payer: Medicare Other | Admitting: Internal Medicine

## 2017-04-10 DIAGNOSIS — F0150 Vascular dementia without behavioral disturbance: Secondary | ICD-10-CM

## 2017-04-10 DIAGNOSIS — F329 Major depressive disorder, single episode, unspecified: Secondary | ICD-10-CM | POA: Diagnosis not present

## 2017-04-10 DIAGNOSIS — Z86711 Personal history of pulmonary embolism: Secondary | ICD-10-CM | POA: Diagnosis not present

## 2017-04-10 DIAGNOSIS — F32A Depression, unspecified: Secondary | ICD-10-CM

## 2017-04-10 NOTE — Progress Notes (Signed)
Location:  [facility] Room Number: [phone] Place of Service:  SNF (31)  [name], MD  Patient Care Team: [name], MD as PCP - General (Internal Medicine)  Extended Emergency Contact Information Primary Emergency Contact: Culclasure,[name]  [place] Phone: [phone] Relation: Other    Allergies: Ciprofloxacin hcl; Codeine; Duloxetine hcl; Hctz [hydrochlorothiazide]; Metaxalone; Morphine and related; Olmesartan; Penicillins; Quetiapine fumarate; and Zofran [ondansetron hcl]  Chief Complaint  Patient presents with  . Medical Management of Chronic Issues    routine visit    HPI: Patient is 83 y.o. female who is being seen for routine issues ofhistory of PE, depression, and dementia.  Past Medical History:  Diagnosis Date  . Anxiety state 02/25/2013  . Chronic kidney disease, stage III (moderate) (HCC)   . CKD (chronic kidney disease) stage 3, GFR 30-59 ml/min (HCC) 04/14/2016  . Dementia, multi-infarct 10/12/2012  . Depression 10/12/2012  . Gastroesophageal reflux disease without esophagitis   . Generalized anxiety disorder   . GERD (gastroesophageal reflux disease) 10/12/2012  . History of falling   . History of pulmonary embolism 10/12/2012  . Hyperlipidemia   . Hypertension   . Hypo-osmolality and hyponatremia   . Hypokalemia   . Hypothyroid   . Long term current use of anticoagulant therapy 10/12/2012  . Major depressive disorder, single episode, unspecified   . OA (osteoarthritis)   . Old myocardial infarction   . Osteoarthritis 02/04/2013  . PE (pulmonary embolism)   . Peripheral vascular disease, unspecified ([place])   . Vascular dementia without behavioral disturbance     Past Surgical History:  Procedure Laterality Date  . JOINT REPLACEMENT     B knees    Allergies as of 04/10/2017      Reactions   Ciprofloxacin Hcl    Codeine    Duloxetine Hcl    Hctz [hydrochlorothiazide]    Metaxalone      Morphine And Related    Olmesartan    Penicillins    Quetiapine Fumarate    Zofran [ondansetron Hcl]       Medication List        Accurate as of 04/10/17 11:59 PM. Always use your most recent med list.          calcium-vitamin D 500-200 MG-UNIT tablet Commonly known as:  OSCAL WITH D Take 1 tablet by mouth 2 (two) times daily.   DAILY VITE Tabs Take 1 tablet by mouth daily.   docusate sodium 100 MG capsule Commonly known as:  COLACE Take 100 mg by mouth 2 (two) times daily. Hold for loose stool   famotidine 20 MG tablet Commonly known as:  PEPCID Take 20 mg by mouth daily. For reflux   fish oil-omega-3 fatty acids 1000 MG capsule Take 1 g by mouth 2 (two) times daily. For hyperlipidemia   levothyroxine 75 MCG tablet Commonly known as:  SYNTHROID, LEVOTHROID Take 75 mcg by mouth daily. For hypothyroidism   lisinopril 5 MG tablet Commonly known as:  PRINIVIL,ZESTRIL Take 5 mg by mouth daily.   memantine 10 MG tablet Commonly known as:  NAMENDA Take 10 mg by mouth 2 (two) times daily. For dementia   polyethylene glycol packet Commonly known as:  MIRALAX / GLYCOLAX Take 17 g by mouth daily. HOLD FOR LOOSE STOOLS   rivaroxaban 20 MG Tabs tablet Commonly known as:  XARELTO Take 20 mg by mouth daily with supper.   sodium chloride 1 g tablet Take  1 g by mouth 3 (three) times daily with meals. For sodium supplement   TYLENOL 8 HOUR ARTHRITIS PAIN 650 MG CR tablet Generic drug:  acetaminophen Take 650 mg by mouth every 12 (twelve) hours. For arthritis   venlafaxine XR 75 MG 24 hr capsule Commonly known as:  EFFEXOR-XR Take 75 mg by mouth daily with breakfast.   Vitamin D (Ergocalciferol) 50000 units Caps capsule Commonly known as:  DRISDOL Take 50,000 Units by mouth every 7 (seven) days.       No orders of the defined types were placed in this encounter.   Immunization History  Administered Date(s) Administered  . Influenza-Unspecified 04/26/2013,  05/20/2014, 04/21/2015, 05/28/2016    Social History   Tobacco Use  . Smoking status: Never Smoker  . Smokeless tobacco: Never Used  Substance Use Topics  . Alcohol use: No    Review of Systems  DATA OBTAINED: from patient, nurse GENERAL:  no fevers, fatigue, appetite changes SKIN: No itching, rash HEENT: No complaint RESPIRATORY: No cough, wheezing, SOB CARDIAC: No chest pain, palpitations, lower extremity edema  GI: No abdominal pain, No N/V/D or constipation, No heartburn or reflux  GU: No dysuria, frequency or urgency, or incontinence  MUSCULOSKELETAL: No unrelieved bone/joint pain NEUROLOGIC: No headache, dizziness  PSYCHIATRIC: No overt anxiety or sadness  Vitals:   04/10/17 1421  BP: 134/68  Pulse: 76  Resp: (!) 22  Temp: 98 F (36.7 C)   Body mass index is 32.93 kg/m. Physical Exam  GENERAL APPEARANCE: Alert, conversant, No acute distress  SKIN: No diaphoresis rash HEENT: Unremarkable RESPIRATORY: Breathing is even, unlabored. Lung sounds are clear   CARDIOVASCULAR: Heart RRR no murmurs, rubs or gallops. No peripheral edema  GASTROINTESTINAL: Abdomen is soft, non-tender, not distended w/ normal bowel sounds.  GENITOURINARY: Bladder non tender, not distended  MUSCULOSKELETAL: No abnormal joints or musculature NEUROLOGIC: Cranial nerves 2-12 grossly intact. Moves all extremities PSYCHIATRIC: Mood and affect appropriate to situation with dementia, no behavioral issues  Patient Active Problem List   Diagnosis Date Noted  . Peripheral vascular disease, unspecified ([place]) 04/05/2017  . Vascular dementia without behavioral disturbance 02/06/2017  . CKD (chronic kidney disease) stage 3, GFR 30-59 ml/min (HCC) 04/14/2016  . Hyperlipidemia 03/17/2016  . Fever 05/10/2014  . Nausea with vomiting 03/14/2014  . Mouth pain 11/17/2013  . Anxiety state 02/25/2013  . Lethargy 02/25/2013  . Osteoarthritis 02/04/2013  . Edema 02/04/2013  . Other convulsions 11/27/2012   . Unspecified constipation 11/27/2012  . History of pulmonary embolism 10/12/2012  . Long term current use of anticoagulant therapy 10/12/2012  . E. coli UTI 10/12/2012  . Essential hypertension, benign 10/12/2012  . Depression 10/12/2012  . Hypothyroidism 10/12/2012  . Dementia, multi-infarct 10/12/2012  . GERD (gastroesophageal reflux disease) 10/12/2012  . Psychosis ([place]) 10/12/2012  . Hypokalemia 10/12/2012  . Hyponatremia 10/12/2012    CMP     Component Value Date/Time   NA 137 02/10/2017   K 4.1 02/10/2017   CL 98 08/28/2010 1845   CO2 31 08/28/2010 1845   GLUCOSE 89 08/28/2010 1845   BUN 19 02/10/2017   CREATININE 0.8 02/10/2017   CREATININE 1.0 08/28/2010 1845   CALCIUM 9.6 08/28/2010 1845   AST 20 02/10/2017   ALT 11 02/10/2017   ALKPHOS 75 02/10/2017   GFRNONAA 54 (L) 08/28/2010 1845   GFRAA  08/28/2010 1845    >60        The eGFR has been calculated using the MDRD equation. This calculation  has not been validated in all clinical situations. eGFR's persistently <60 mL/min signify possible Chronic Kidney Disease.   Recent Labs    02/10/17  NA 137  K 4.1  BUN 19  CREATININE 0.8   Recent Labs    02/10/17  AST 20  ALT 11  ALKPHOS 75   Recent Labs    02/10/17  WBC 4.1  HGB 14.2  HCT 41  PLT 177   Recent Labs    02/10/17  CHOL 210*  LDLCALC 123  TRIG 49   No results found for: [facility] Lab Results  Component Value Date   TSH 1.09 02/10/2017   Lab Results  Component Value Date   HGBA1C 5.5 02/10/2017   Lab Results  Component Value Date   CHOL 210 (A) 02/10/2017   HDL 77 (A) 02/10/2017   LDLCALC 123 02/10/2017   TRIG 49 02/10/2017    Significant Diagnostic Results in last 30 days:  No results found.  Assessment and Plan  History of pulmonary embolism Stable; on chronic anticoagulation with xarelto 20 mg by mouth daily; continue current medications  Depression Stable; continue Effexor 75 mg by mouth daily  Vascular  dementia without behavioral disturbance Very stable without any declines; continue Namenda 10 mg by moutwice a day     Hisako Bugh D. [name], MD

## 2017-05-03 LAB — VITAMIN D 25 HYDROXY (VIT D DEFICIENCY, FRACTURES): Vit D, 25-Hydroxy: 40.38

## 2017-05-12 ENCOUNTER — Encounter: Payer: Self-pay | Admitting: Internal Medicine

## 2017-05-12 ENCOUNTER — Non-Acute Institutional Stay (SKILLED_NURSING_FACILITY): Payer: Medicare Other | Admitting: Internal Medicine

## 2017-05-12 DIAGNOSIS — K219 Gastro-esophageal reflux disease without esophagitis: Secondary | ICD-10-CM | POA: Diagnosis not present

## 2017-05-12 DIAGNOSIS — I739 Peripheral vascular disease, unspecified: Secondary | ICD-10-CM

## 2017-05-12 DIAGNOSIS — I10 Essential (primary) hypertension: Secondary | ICD-10-CM

## 2017-05-12 NOTE — Progress Notes (Signed)
Location:  [facility] Room Number: 418D Place of Service:  SNF (31)  Tricia Duos, Tricia Martin  Patient Care Team: Tricia Duos, Tricia Martin as PCP - General (Internal Medicine)  Extended Emergency Contact Information Primary Emergency Contact: Culclasure,[name]  [place] Phone: [phone] Relation: Other    Allergies: Ciprofloxacin hcl; Codeine; Duloxetine hcl; Hctz [hydrochlorothiazide]; Metaxalone; Morphine and related; Olmesartan; Penicillins; Quetiapine fumarate; and Zofran [ondansetron hcl]  Chief Complaint  Patient presents with  . Medical Management of Chronic Issues    routine visit    HPI: Patient is 83 y.o. female who is being seen for routine issues of hypertension, peripheral vascular disease, and GERD.  Past Medical History:  Diagnosis Date  . Anxiety state 02/25/2013  . Chronic kidney disease, stage III (moderate) (HCC)   . CKD (chronic kidney disease) stage 3, GFR 30-59 ml/min (HCC) 04/14/2016  . Dementia, multi-infarct 10/12/2012  . Depression 10/12/2012  . Gastroesophageal reflux disease without esophagitis   . Generalized anxiety disorder   . GERD (gastroesophageal reflux disease) 10/12/2012  . History of falling   . History of pulmonary embolism 10/12/2012  . Hyperlipidemia   . Hypertension   . Hypo-osmolality and hyponatremia   . Hypokalemia   . Hypothyroid   . Long term current use of anticoagulant therapy 10/12/2012  . Major depressive disorder, single episode, unspecified   . OA (osteoarthritis)   . Old myocardial infarction   . Osteoarthritis 02/04/2013  . PE (pulmonary embolism)   . Peripheral vascular disease, unspecified ([place])   . Vascular dementia without behavioral disturbance     Past Surgical History:  Procedure Laterality Date  . JOINT REPLACEMENT     B knees    Allergies as of 05/12/2017      Reactions   Ciprofloxacin Hcl    Codeine    Duloxetine Hcl    Hctz [hydrochlorothiazide]      Metaxalone    Morphine And Related    Olmesartan    Penicillins    Quetiapine Fumarate    Zofran [ondansetron Hcl]       Medication List        Accurate as of 05/12/17 11:59 PM. Always use your most recent med list.          calcium-vitamin D 500-200 MG-UNIT tablet Commonly known as:  OSCAL WITH D Take 1 tablet by mouth 2 (two) times daily.   DAILY VITE Tabs Take 1 tablet by mouth daily.   docusate sodium 100 MG capsule Commonly known as:  COLACE Take 100 mg by mouth 2 (two) times daily. Hold for loose stool   famotidine 20 MG tablet Commonly known as:  PEPCID Take 20 mg by mouth daily. For reflux   fish oil-omega-3 fatty acids 1000 MG capsule Take 1 g by mouth 2 (two) times daily. For hyperlipidemia   levothyroxine 75 MCG tablet Commonly known as:  SYNTHROID, LEVOTHROID Take 75 mcg by mouth daily. For hypothyroidism   lisinopril 5 MG tablet Commonly known as:  PRINIVIL,ZESTRIL Take 5 mg by mouth daily.   memantine 10 MG tablet Commonly known as:  NAMENDA Take 10 mg by mouth 2 (two) times daily. For dementia   polyethylene glycol packet Commonly known as:  MIRALAX / GLYCOLAX Take 17 g by mouth daily. HOLD FOR LOOSE STOOLS   rivaroxaban 20 MG Tabs tablet Commonly known as:  XARELTO Take 20 mg by mouth daily with supper.   sodium chloride 1 g tablet  Take 1 g by mouth 3 (three) times daily with meals. For sodium supplement   TYLENOL 8 HOUR ARTHRITIS PAIN 650 MG CR tablet Generic drug:  acetaminophen Take 650 mg by mouth every 12 (twelve) hours. For arthritis   venlafaxine XR 75 MG 24 hr capsule Commonly known as:  EFFEXOR-XR Take 75 mg by mouth daily with breakfast.   Vitamin D (Ergocalciferol) 50000 units Caps capsule Commonly known as:  DRISDOL Take 50,000 Units by mouth every 7 (seven) days.       No orders of the defined types were placed in this encounter.   Immunization History  Administered Date(s) Administered  .  Influenza-Unspecified 04/26/2013, 05/20/2014, 04/21/2015, 05/28/2016    Social History   Tobacco Use  . Smoking status: Never Smoker  . Smokeless tobacco: Never Used  Substance Use Topics  . Alcohol use: No    Review of Systems  DATA OBTAINED: from patient, nurse GENERAL:  no fevers, fatigue, appetite changes SKIN: No itching, rash HEENT: No complaint RESPIRATORY: No cough, wheezing, SOB CARDIAC: No chest pain, palpitations, lower extremity edema  GI: No abdominal pain, No N/V/D or constipation, No heartburn or reflux  GU: No dysuria, frequency or urgency, or incontinence  MUSCULOSKELETAL: No unrelieved bone/joint pain NEUROLOGIC: No headache, dizziness  PSYCHIATRIC: No overt anxiety or sadness  Vitals:   05/12/17 1513  BP: 100/68  Pulse: 70  Resp: 18  Temp: (!) 97.1 F (36.2 C)  SpO2: 94%   Body mass index is 33.09 kg/m. Physical Exam  GENERAL APPEARANCE: Alert, conversant, No acute distress  SKIN: No diaphoresis rash HEENT: Unremarkable RESPIRATORY: Breathing is even, unlabored. Lung sounds are clear   CARDIOVASCULAR: Heart RRR no murmurs, rubs or gallops. No peripheral edema  GASTROINTESTINAL: Abdomen is soft, non-tender, not distended w/ normal bowel sounds.  GENITOURINARY: Bladder non tender, not distended  MUSCULOSKELETAL: No abnormal joints or musculature NEUROLOGIC: Cranial nerves 2-12 grossly intact. Moves all extremities PSYCHIATRIC: Mood and affect appropriate with dementia, no behavioral issues  Patient Active Problem List   Diagnosis Date Noted  . Peripheral vascular disease, unspecified ([place]) 04/05/2017  . Vascular dementia without behavioral disturbance 02/06/2017  . CKD (chronic kidney disease) stage 3, GFR 30-59 ml/min (HCC) 04/14/2016  . Hyperlipidemia 03/17/2016  . Fever 05/10/2014  . Nausea with vomiting 03/14/2014  . Mouth pain 11/17/2013  . Anxiety state 02/25/2013  . Lethargy 02/25/2013  . Osteoarthritis 02/04/2013  . Edema  02/04/2013  . Other convulsions 11/27/2012  . Unspecified constipation 11/27/2012  . History of pulmonary embolism 10/12/2012  . Long term current use of anticoagulant therapy 10/12/2012  . E. coli UTI 10/12/2012  . Essential hypertension, benign 10/12/2012  . Depression 10/12/2012  . Hypothyroidism 10/12/2012  . Dementia, multi-infarct 10/12/2012  . GERD (gastroesophageal reflux disease) 10/12/2012  . Psychosis ([place]) 10/12/2012  . Hypokalemia 10/12/2012  . Hyponatremia 10/12/2012    CMP     Component Value Date/Time   NA 137 02/10/2017   K 4.1 02/10/2017   CL 98 08/28/2010 1845   CO2 31 08/28/2010 1845   GLUCOSE 89 08/28/2010 1845   BUN 19 02/10/2017   CREATININE 0.8 02/10/2017   CREATININE 1.0 08/28/2010 1845   CALCIUM 9.6 08/28/2010 1845   AST 20 02/10/2017   ALT 11 02/10/2017   ALKPHOS 75 02/10/2017   GFRNONAA 54 (L) 08/28/2010 1845   GFRAA  08/28/2010 1845    >60        The eGFR has been calculated using the MDRD equation.  This calculation has not been validated in all clinical situations. eGFR's persistently <60 mL/min signify possible Chronic Kidney Disease.   Recent Labs    02/10/17  NA 137  K 4.1  BUN 19  CREATININE 0.8   Recent Labs    02/10/17  AST 20  ALT 11  ALKPHOS 75   Recent Labs    02/10/17  WBC 4.1  HGB 14.2  HCT 41  PLT 177   Recent Labs    02/10/17  CHOL 210*  LDLCALC 123  TRIG 49   No results found for: [facility] Lab Results  Component Value Date   TSH 1.09 02/10/2017   Lab Results  Component Value Date   HGBA1C 5.5 02/10/2017   Lab Results  Component Value Date   CHOL 210 (A) 02/10/2017   HDL 77 (A) 02/10/2017   LDLCALC 123 02/10/2017   TRIG 49 02/10/2017    Significant Diagnostic Results in last 30 days:  No results found.  Assessment and Plan  Essential hypertension, benign Controlled on lisinopril 5 mg by mouth daily; plan to continue current regimen  Peripheral vascular disease, unspecified  (HCC) No reported ulcers or skin breakdowns plan to continue xarelto 20 mg by mouth daily  GERD (gastroesophageal reflux disease)  No Reported reflux; continue Pepcid 20 mg by mouth daily    Kinzy Weyers D. [name], Tricia Martin

## 2017-05-31 ENCOUNTER — Encounter: Payer: Self-pay | Admitting: Internal Medicine

## 2017-05-31 NOTE — Assessment & Plan Note (Signed)
No reported ulcers or skin breakdowns plan to continue xarelto 20 mg by mouth daily

## 2017-05-31 NOTE — Assessment & Plan Note (Signed)
  No Reported reflux; continue Pepcid 20 mg by mouth daily

## 2017-05-31 NOTE — Assessment & Plan Note (Signed)
Very stable without any declines; continue Namenda 10 mg by moutwice a day

## 2017-05-31 NOTE — Assessment & Plan Note (Signed)
Stable; on chronic anticoagulation with xarelto 20 mg by mouth daily; continue current medications

## 2017-05-31 NOTE — Assessment & Plan Note (Signed)
Controlled on lisinopril 5 mg by mouth daily; plan to continue current regimen

## 2017-05-31 NOTE — Assessment & Plan Note (Signed)
Stable; continue Effexor 75 mg by mouth daily

## 2017-06-18 ENCOUNTER — Encounter: Payer: Self-pay | Admitting: Internal Medicine

## 2017-06-18 ENCOUNTER — Non-Acute Institutional Stay (SKILLED_NURSING_FACILITY): Payer: Medicare Other | Admitting: Internal Medicine

## 2017-06-18 DIAGNOSIS — E785 Hyperlipidemia, unspecified: Secondary | ICD-10-CM

## 2017-06-18 DIAGNOSIS — E034 Atrophy of thyroid (acquired): Secondary | ICD-10-CM | POA: Diagnosis not present

## 2017-06-18 DIAGNOSIS — F324 Major depressive disorder, single episode, in partial remission: Secondary | ICD-10-CM | POA: Diagnosis not present

## 2017-06-18 NOTE — Progress Notes (Signed)
Location:  [facility] Room Number: [phone] Place of Service:  SNF (31)  [name], MD  Patient Care Team: [name], MD as PCP - General (Internal Medicine)  Extended Emergency Contact Information Primary Emergency Contact: Culclasure,[name]  [place] Phone: [phone] Relation: Other    Allergies: Ciprofloxacin hcl; Codeine; Duloxetine hcl; Hctz [hydrochlorothiazide]; Metaxalone; Morphine and related; Olmesartan; Penicillins; Quetiapine fumarate; and Zofran [ondansetron hcl]  Chief Complaint  Patient presents with  . Medical Management of Chronic Issues    routine visit    HPI: Patient is 83 y.o. female who is being seen for routine issues of hyperlipidemia, depression, and hypothyroidism.  Past Medical History:  Diagnosis Date  . Anxiety state 02/25/2013  . Chronic kidney disease, stage III (moderate) (HCC)   . CKD (chronic kidney disease) stage 3, GFR 30-59 ml/min (HCC) 04/14/2016  . Dementia, multi-infarct 10/12/2012  . Depression 10/12/2012  . Gastroesophageal reflux disease without esophagitis   . Generalized anxiety disorder   . GERD (gastroesophageal reflux disease) 10/12/2012  . History of falling   . History of pulmonary embolism 10/12/2012  . Hyperlipidemia   . Hypertension   . Hypo-osmolality and hyponatremia   . Hypokalemia   . Hypothyroid   . Long term current use of anticoagulant therapy 10/12/2012  . Major depressive disorder, single episode, unspecified   . OA (osteoarthritis)   . Old myocardial infarction   . Osteoarthritis 02/04/2013  . PE (pulmonary embolism)   . Peripheral vascular disease, unspecified ([place])   . Vascular dementia without behavioral disturbance     Past Surgical History:  Procedure Laterality Date  . JOINT REPLACEMENT     B knees    Allergies as of 06/18/2017      Reactions   Ciprofloxacin Hcl    Codeine    Duloxetine Hcl    Hctz [hydrochlorothiazide]    Metaxalone    Morphine And Related    Olmesartan    Penicillins    Quetiapine Fumarate    Zofran [ondansetron Hcl]       Medication List        Accurate as of 06/18/17 11:59 PM. Always use your most recent med list.          calcium-vitamin D 500-200 MG-UNIT tablet Commonly known as:  OSCAL WITH D Take 1 tablet by mouth 2 (two) times daily.   DAILY VITE Tabs Take 1 tablet by mouth daily.   docusate sodium 100 MG capsule Commonly known as:  COLACE Take 100 mg by mouth 2 (two) times daily. Hold for loose stool   famotidine 20 MG tablet Commonly known as:  PEPCID Take 20 mg by mouth daily. For reflux   fish oil-omega-3 fatty acids 1000 MG capsule Take 1 g by mouth 2 (two) times daily. For hyperlipidemia   levothyroxine 75 MCG tablet Commonly known as:  SYNTHROID, LEVOTHROID Take 75 mcg by mouth daily. For hypothyroidism   lisinopril 5 MG tablet Commonly known as:  PRINIVIL,ZESTRIL Take 5 mg by mouth daily.   memantine 10 MG tablet Commonly known as:  NAMENDA Take 10 mg by mouth 2 (two) times daily. For dementia   polyethylene glycol packet Commonly known as:  MIRALAX / GLYCOLAX Take 17 g by mouth daily. HOLD FOR LOOSE STOOLS   rivaroxaban 20 MG Tabs tablet Commonly known as:  XARELTO Take 20 mg by mouth daily with supper.   sodium chloride 1 g tablet Take 1 g by  mouth 3 (three) times daily with meals. For sodium supplement   TYLENOL 8 HOUR ARTHRITIS PAIN 650 MG CR tablet Generic drug:  acetaminophen Take 650 mg by mouth every 12 (twelve) hours. For arthritis   venlafaxine XR 75 MG 24 hr capsule Commonly known as:  EFFEXOR-XR Take 75 mg by mouth daily with breakfast.       No orders of the defined types were placed in this encounter.   Immunization History  Administered Date(s) Administered  . Influenza-Unspecified 04/26/2013, 05/20/2014, 04/21/2015, 05/28/2016    Social History   Tobacco Use  . Smoking status: Never Smoker  . Smokeless  tobacco: Never Used  Substance Use Topics  . Alcohol use: No    Review of Systems  DATA OBTAINED: from patient, nurse GENERAL:  no fevers, fatigue, appetite changes SKIN: No itching, rash HEENT: No complaint RESPIRATORY: No cough, wheezing, SOB CARDIAC: No chest pain, palpitations, lower extremity edema  GI: No abdominal pain, No N/V/D or constipation, No heartburn or reflux  GU: No dysuria, frequency or urgency, or incontinence  MUSCULOSKELETAL: No unrelieved bone/joint pain NEUROLOGIC: No headache, dizziness  PSYCHIATRIC: No overt anxiety or sadness  Vitals:   06/18/17 1429  BP: 132/68  Pulse: 71  Resp: 18  Temp: (!) 96.9 F (36.1 C)   Body mass index is 33.67 kg/m. Physical Exam  GENERAL APPEARANCE: Alert, conversant, No acute distress  SKIN: No diaphoresis rash HEENT: Unremarkable RESPIRATORY: Breathing is even, unlabored.  CARDIOVASCULAR: Heart RRR no murmurs, rubs or gallops. No peripheral edema  GASTROINTESTINAL: Abdomen is soft, non-tender, not distended w/ normal bowel sounds.  GENITOURINARY: Bladder non tender, not distended  MUSCULOSKELETAL: No abnormal joints or musculature NEUROLOGIC: Cranial nerves 2-12 grossly intact. Moves all extremities PSYCHIATRIC: Mood and affect appropriate to situation, no behavioral issues  Patient Active Problem List   Diagnosis Date Noted  . Major depressive disorder, single episode, unspecified 06/20/2017  . Peripheral vascular disease, unspecified ([place]) 04/05/2017  . Vascular dementia without behavioral disturbance 02/06/2017  . CKD (chronic kidney disease) stage 3, GFR 30-59 ml/min (HCC) 04/14/2016  . Hyperlipidemia 03/17/2016  . Fever 05/10/2014  . Nausea with vomiting 03/14/2014  . Mouth pain 11/17/2013  . Anxiety state 02/25/2013  . Lethargy 02/25/2013  . Osteoarthritis 02/04/2013  . Edema 02/04/2013  . Other convulsions 11/27/2012  . Unspecified constipation 11/27/2012  . History of pulmonary embolism  10/12/2012  . Long term current use of anticoagulant therapy 10/12/2012  . E. coli UTI 10/12/2012  . Essential hypertension, benign 10/12/2012  . Depression 10/12/2012  . Hypothyroidism 10/12/2012  . Dementia, multi-infarct 10/12/2012  . GERD (gastroesophageal reflux disease) 10/12/2012  . Psychosis ([place]) 10/12/2012  . Hypokalemia 10/12/2012  . Hyponatremia 10/12/2012    CMP     Component Value Date/Time   NA 137 02/10/2017   K 4.1 02/10/2017   CL 98 08/28/2010 1845   CO2 31 08/28/2010 1845   GLUCOSE 89 08/28/2010 1845   BUN 19 02/10/2017   CREATININE 0.8 02/10/2017   CREATININE 1.0 08/28/2010 1845   CALCIUM 9.6 08/28/2010 1845   AST 20 02/10/2017   ALT 11 02/10/2017   ALKPHOS 75 02/10/2017   GFRNONAA 54 (L) 08/28/2010 1845   GFRAA  08/28/2010 1845    >60        The eGFR has been calculated using the MDRD equation. This calculation has not been validated in all clinical situations. eGFR's persistently <60 mL/min signify possible Chronic Kidney Disease.   Recent Labs  02/10/17  NA 137  K 4.1  BUN 19  CREATININE 0.8   Recent Labs    02/10/17  AST 20  ALT 11  ALKPHOS 75   Recent Labs    02/10/17  WBC 4.1  HGB 14.2  HCT 41  PLT 177   Recent Labs    02/10/17  CHOL 210*  LDLCALC 123  TRIG 49   No results found for: [facility] Lab Results  Component Value Date   TSH 1.09 02/10/2017   Lab Results  Component Value Date   HGBA1C 5.5 02/10/2017   Lab Results  Component Value Date   CHOL 210 (A) 02/10/2017   HDL 77 (A) 02/10/2017   LDLCALC 123 02/10/2017   TRIG 49 02/10/2017    Significant Diagnostic Results in last 30 days:  No results found.  Assessment and Plan  Hyperlipidemia LDL is 123, but HDL is 77 which is very protective; plan to continue omega-3 fatty acids 1 g by mouth daily  Major depressive disorder, single episode, unspecified Stable; continue Effexor XR 75 mg by mouth daily  Hypothyroidism Controlled; continue  Synthroid 75 g by mouth daily     Azzan Butler D. [name], MD

## 2017-06-20 ENCOUNTER — Encounter: Payer: Self-pay | Admitting: Internal Medicine

## 2017-06-20 DIAGNOSIS — F329 Major depressive disorder, single episode, unspecified: Secondary | ICD-10-CM | POA: Insufficient documentation

## 2017-06-20 NOTE — Assessment & Plan Note (Signed)
LDL is 123, but HDL is 77 which is very protective; plan to continue omega-3 fatty acids 1 g by mouth daily

## 2017-06-20 NOTE — Assessment & Plan Note (Signed)
Stable; continue Effexor XR 75 mg by mouth daily

## 2017-06-20 NOTE — Assessment & Plan Note (Signed)
Controlled; continue Synthroid 75 g by mouth daily

## 2017-07-18 ENCOUNTER — Encounter: Payer: Self-pay | Admitting: Internal Medicine

## 2017-07-18 ENCOUNTER — Non-Acute Institutional Stay (SKILLED_NURSING_FACILITY): Payer: Medicare Other | Admitting: Internal Medicine

## 2017-07-18 DIAGNOSIS — E034 Atrophy of thyroid (acquired): Secondary | ICD-10-CM | POA: Diagnosis not present

## 2017-07-18 DIAGNOSIS — F329 Major depressive disorder, single episode, unspecified: Secondary | ICD-10-CM | POA: Diagnosis not present

## 2017-07-18 DIAGNOSIS — Z7901 Long term (current) use of anticoagulants: Secondary | ICD-10-CM

## 2017-07-18 DIAGNOSIS — F32A Depression, unspecified: Secondary | ICD-10-CM

## 2017-07-18 NOTE — Progress Notes (Signed)
Location:  [facility] [place] Room Number: [phone] Place of Service:  SNF (31)  [name], MD  Patient Care Team: [name], MD as PCP - General (Internal Medicine)  Extended Emergency Contact Information Primary Emergency Contact: Culclasure,[name]  [place] Phone: [phone] Relation: Other    Allergies: Ciprofloxacin hcl; Codeine; Duloxetine hcl; Hctz [hydrochlorothiazide]; Metaxalone; Morphine and related; Olmesartan; Penicillins; Quetiapine fumarate; and Zofran [ondansetron hcl]  Chief Complaint  Patient presents with  . Medical Management of Chronic Issues    routine visit    HPI: Patient is 83 y.o. female who is being seen for routine issues of chronic anticoagulation, hypothyroidism, and depression.  Past Medical History:  Diagnosis Date  . Anxiety state 02/25/2013  . Chronic kidney disease, stage III (moderate) (HCC)   . CKD (chronic kidney disease) stage 3, GFR 30-59 ml/min (HCC) 04/14/2016  . Dementia, multi-infarct 10/12/2012  . Depression 10/12/2012  . Gastroesophageal reflux disease without esophagitis   . Generalized anxiety disorder   . GERD (gastroesophageal reflux disease) 10/12/2012  . History of falling   . History of pulmonary embolism 10/12/2012  . Hyperlipidemia   . Hypertension   . Hypo-osmolality and hyponatremia   . Hypokalemia   . Hypothyroid   . Long term current use of anticoagulant therapy 10/12/2012  . Major depressive disorder, single episode, unspecified   . OA (osteoarthritis)   . Old myocardial infarction   . Osteoarthritis 02/04/2013  . PE (pulmonary embolism)   . Peripheral vascular disease, unspecified ([place])   . Vascular dementia without behavioral disturbance     Past Surgical History:  Procedure Laterality Date  . JOINT REPLACEMENT     B knees    Allergies as of 07/18/2017      Reactions   Ciprofloxacin Hcl    Codeine    Duloxetine Hcl    Hctz  [hydrochlorothiazide]    Metaxalone    Morphine And Related    Olmesartan    Penicillins    Quetiapine Fumarate    Zofran [ondansetron Hcl]       Medication List        Accurate as of 07/18/17 11:59 PM. Always use your most recent med list.          calcium-vitamin D 500-200 MG-UNIT tablet Commonly known as:  OSCAL WITH D Take 1 tablet by mouth 2 (two) times daily.   DAILY VITE Tabs Take 1 tablet by mouth daily.   DIALYVITE VITAMIN D3 MAX 60737 units Tabs Generic drug:  Cholecalciferol Take by mouth. Take one tablet once a week for 12 weeks for Vit D.   docusate sodium 100 MG capsule Commonly known as:  COLACE Take 100 mg by mouth 2 (two) times daily. Hold for loose stool   famotidine 20 MG tablet Commonly known as:  PEPCID Take 20 mg by mouth daily. For reflux   fish oil-omega-3 fatty acids 1000 MG capsule Take 1 g by mouth 2 (two) times daily. For hyperlipidemia   levothyroxine 75 MCG tablet Commonly known as:  SYNTHROID, LEVOTHROID Take 75 mcg by mouth daily. For hypothyroidism   lisinopril 5 MG tablet Commonly known as:  PRINIVIL,ZESTRIL Take 5 mg by mouth daily.   memantine 10 MG tablet Commonly known as:  NAMENDA Take 10 mg by mouth 2 (two) times daily. For dementia   polyethylene glycol packet Commonly known as:  MIRALAX / GLYCOLAX Take 17 g by mouth daily. HOLD FOR LOOSE STOOLS  rivaroxaban 20 MG Tabs tablet Commonly known as:  XARELTO Take 20 mg by mouth daily with supper.   sodium chloride 1 g tablet Take 1 g by mouth 3 (three) times daily with meals. For sodium supplement   TYLENOL 8 HOUR ARTHRITIS PAIN 650 MG CR tablet Generic drug:  acetaminophen Take 650 mg by mouth every 12 (twelve) hours. For arthritis   venlafaxine XR 75 MG 24 hr capsule Commonly known as:  EFFEXOR-XR Take 75 mg by mouth daily with breakfast.       No orders of the defined types were placed in this encounter.   Immunization History  Administered Date(s)  Administered  . Influenza-Unspecified 04/26/2013, 04/21/2015, 05/28/2016, 05/29/2017    Social History   Tobacco Use  . Smoking status: Never Smoker  . Smokeless tobacco: Never Used  Substance Use Topics  . Alcohol use: No    Review of Systems  DATA OBTAINED: from patient, nurse GENERAL:  no fevers, fatigue, appetite changes SKIN: No itching, rash HEENT: No complaint RESPIRATORY: No cough, wheezing, SOB CARDIAC: No chest pain, palpitations, lower extremity edema  GI: No abdominal pain, No N/V/D or constipation, No heartburn or reflux  GU: No dysuria, frequency or urgency, or incontinence  MUSCULOSKELETAL: No unrelieved bone/joint pain NEUROLOGIC: No headache, dizziness  PSYCHIATRIC: No overt anxiety or sadness  Vitals:   07/18/17 1615  BP: 122/84  Pulse: 64  Resp: 16  Temp: (!) 97.3 F (36.3 C)  SpO2: 95%   Body mass index is 33.41 kg/m. Physical Exam  GENERAL APPEARANCE: Alert, conversant, No acute distress  SKIN: No diaphoresis rash HEENT: Unremarkable RESPIRATORY: Breathing is even, unlabored. Lung sounds are clear   CARDIOVASCULAR: Heart RRR no murmurs, rubs or gallops. No peripheral edema  GASTROINTESTINAL: Abdomen is soft, non-tender, not distended w/ normal bowel sounds.  GENITOURINARY: Bladder non tender, not distended  MUSCULOSKELETAL: No abnormal joints or musculature NEUROLOGIC: Cranial nerves 2-12 grossly intact. Moves all extremities PSYCHIATRIC: Mood and affect appropriate to situation, flat affect his baseline, no behavioral issues  Patient Active Problem List   Diagnosis Date Noted  . Major depressive disorder, single episode, unspecified 06/20/2017  . Peripheral vascular disease, unspecified ([place]) 04/05/2017  . Vascular dementia without behavioral disturbance 02/06/2017  . CKD (chronic kidney disease) stage 3, GFR 30-59 ml/min (HCC) 04/14/2016  . Hyperlipidemia 03/17/2016  . Fever 05/10/2014  . Nausea with vomiting 03/14/2014  . Mouth pain  11/17/2013  . Anxiety state 02/25/2013  . Lethargy 02/25/2013  . Osteoarthritis 02/04/2013  . Edema 02/04/2013  . Other convulsions 11/27/2012  . Unspecified constipation 11/27/2012  . History of pulmonary embolism 10/12/2012  . Long term current use of anticoagulant therapy 10/12/2012  . E. coli UTI 10/12/2012  . Essential hypertension, benign 10/12/2012  . Depression 10/12/2012  . Hypothyroidism 10/12/2012  . Dementia, multi-infarct 10/12/2012  . GERD (gastroesophageal reflux disease) 10/12/2012  . Psychosis ([place]) 10/12/2012  . Hypokalemia 10/12/2012  . Hyponatremia 10/12/2012    CMP     Component Value Date/Time   NA 137 02/10/2017   K 4.1 02/10/2017   CL 98 08/28/2010 1845   CO2 31 08/28/2010 1845   GLUCOSE 89 08/28/2010 1845   BUN 19 02/10/2017   CREATININE 0.8 02/10/2017   CREATININE 1.0 08/28/2010 1845   CALCIUM 9.6 08/28/2010 1845   AST 20 02/10/2017   ALT 11 02/10/2017   ALKPHOS 75 02/10/2017   GFRNONAA 54 (L) 08/28/2010 1845   GFRAA  08/28/2010 1845    >60  The eGFR has been calculated using the MDRD equation. This calculation has not been validated in all clinical situations. eGFR's persistently <60 mL/min signify possible Chronic Kidney Disease.   Recent Labs    02/10/17  NA 137  K 4.1  BUN 19  CREATININE 0.8   Recent Labs    02/10/17  AST 20  ALT 11  ALKPHOS 75   Recent Labs    02/10/17  WBC 4.1  HGB 14.2  HCT 41  PLT 177   Recent Labs    02/10/17  CHOL 210*  LDLCALC 123  TRIG 49   No results found for: [facility] Lab Results  Component Value Date   TSH 1.09 02/10/2017   Lab Results  Component Value Date   HGBA1C 5.5 02/10/2017   Lab Results  Component Value Date   CHOL 210 (A) 02/10/2017   HDL 77 (A) 02/10/2017   LDLCALC 123 02/10/2017   TRIG 49 02/10/2017    Significant Diagnostic Results in last 30 days:  No results found.  Assessment and Plan  Long term current use of anticoagulant therapy Patient  also 20 mg by mouth daily as PE prophylaxis; no known problems with her anticoagulation; continue to monitor  Hypothyroidism Stable; continue 75 g by mouth daily  Depression Chronic and stable; continue Effexor 75 mg by mouth daily     Syra Sirmons D. [name], MD

## 2017-08-14 ENCOUNTER — Encounter: Payer: Self-pay | Admitting: Internal Medicine

## 2017-08-14 NOTE — Assessment & Plan Note (Signed)
Chronic and stable; continue Effexor 75 mg by mouth daily

## 2017-08-14 NOTE — Assessment & Plan Note (Signed)
Stable; continue 75 g by mouth daily

## 2017-08-14 NOTE — Assessment & Plan Note (Signed)
Patient also 20 mg by mouth daily as PE prophylaxis; no known problems with her anticoagulation; continue to monitor

## 2017-08-19 ENCOUNTER — Non-Acute Institutional Stay (SKILLED_NURSING_FACILITY): Payer: Medicare Other | Admitting: Internal Medicine

## 2017-08-19 ENCOUNTER — Encounter: Payer: Self-pay | Admitting: Internal Medicine

## 2017-08-19 DIAGNOSIS — I739 Peripheral vascular disease, unspecified: Secondary | ICD-10-CM | POA: Diagnosis not present

## 2017-08-19 DIAGNOSIS — I10 Essential (primary) hypertension: Secondary | ICD-10-CM | POA: Diagnosis not present

## 2017-08-19 DIAGNOSIS — F0150 Vascular dementia without behavioral disturbance: Secondary | ICD-10-CM

## 2017-08-19 NOTE — Progress Notes (Signed)
Location:  [facility] [place] Room Number: [phone] Place of Service:  SNF (31)  [name], MD  Patient Care Team: [name], MD as PCP - General (Internal Medicine)  Extended Emergency Contact Information Primary Emergency Contact: Culclasure,[name]  [place] Phone: [phone] Relation: Other    Allergies: Ciprofloxacin hcl; Codeine; Duloxetine hcl; Hctz [hydrochlorothiazide]; Metaxalone; Morphine and related; Olmesartan; Penicillins; Quetiapine fumarate; and Zofran [ondansetron hcl]  Chief Complaint  Patient presents with  . Medical Management of Chronic Issues    Routine Visit    HPI: Patient is 84 y.o. female who is being seen for routine issues of dementia, hypertension, and peripheral vascular disease.  Past Medical History:  Diagnosis Date  . Anxiety state 02/25/2013  . Chronic kidney disease, stage III (moderate) (HCC)   . CKD (chronic kidney disease) stage 3, GFR 30-59 ml/min (HCC) 04/14/2016  . Dementia, multi-infarct 10/12/2012  . Depression 10/12/2012  . Gastroesophageal reflux disease without esophagitis   . Generalized anxiety disorder   . GERD (gastroesophageal reflux disease) 10/12/2012  . History of falling   . History of pulmonary embolism 10/12/2012  . Hyperlipidemia   . Hypertension   . Hypo-osmolality and hyponatremia   . Hypokalemia   . Hypothyroid   . Long term current use of anticoagulant therapy 10/12/2012  . Major depressive disorder, single episode, unspecified   . OA (osteoarthritis)   . Old myocardial infarction   . Osteoarthritis 02/04/2013  . PE (pulmonary embolism)   . Peripheral vascular disease, unspecified ([place])   . Vascular dementia without behavioral disturbance     Past Surgical History:  Procedure Laterality Date  . JOINT REPLACEMENT     B knees    Allergies as of 08/19/2017      Reactions   Ciprofloxacin Hcl    Codeine    Duloxetine Hcl    Hctz [hydrochlorothiazide]     Metaxalone    Morphine And Related    Olmesartan    Penicillins    Quetiapine Fumarate    Zofran [ondansetron Hcl]       Medication List        Accurate as of 08/19/17 11:59 PM. Always use your most recent med list.          calcium-vitamin D 500-200 MG-UNIT tablet Commonly known as:  OSCAL WITH D Take 1 tablet by mouth daily with breakfast.   DAILY VITE Tabs Take 1 tablet by mouth daily.   DIALYVITE VITAMIN D3 MAX 93267 units Tabs Generic drug:  Cholecalciferol Take 1 tablet by mouth once a week. 12 weeks for Vit D.   docusate sodium 100 MG capsule Commonly known as:  COLACE Take 100 mg by mouth 2 (two) times daily. Hold for loose stool   famotidine 20 MG tablet Commonly known as:  PEPCID Take 20 mg by mouth daily. For reflux   fish oil-omega-3 fatty acids 1000 MG capsule Take 1 g by mouth 2 (two) times daily. For hyperlipidemia   levothyroxine 75 MCG tablet Commonly known as:  SYNTHROID, LEVOTHROID Take 75 mcg by mouth daily. For hypothyroidism   lisinopril 5 MG tablet Commonly known as:  PRINIVIL,ZESTRIL Take 5 mg by mouth daily.   memantine 10 MG tablet Commonly known as:  NAMENDA Take 10 mg by mouth 2 (two) times daily. For dementia   polyethylene glycol packet Commonly known as:  MIRALAX / GLYCOLAX Take 17 g by mouth daily. HOLD FOR LOOSE STOOLS   rivaroxaban  20 MG Tabs tablet Commonly known as:  XARELTO Take 20 mg by mouth daily with supper.   sodium chloride 1 g tablet Take 1 g by mouth 3 (three) times daily with meals. For sodium supplement   TYLENOL 8 HOUR ARTHRITIS PAIN 650 MG CR tablet Generic drug:  acetaminophen Take 650 mg by mouth every 12 (twelve) hours. For arthritis   venlafaxine XR 75 MG 24 hr capsule Commonly known as:  EFFEXOR-XR Take 75 mg by mouth daily with breakfast.       No orders of the defined types were placed in this encounter.   Immunization History  Administered Date(s) Administered  .  Influenza-Unspecified 04/26/2013, 04/21/2015, 05/28/2016, 05/29/2017    Social History   Tobacco Use  . Smoking status: Never Smoker  . Smokeless tobacco: Never Used  Substance Use Topics  . Alcohol use: No    Review of Systems  DATA OBTAINED: from patient, nurse GENERAL:  no fevers, fatigue, appetite changes SKIN: No itching, rash HEENT: No complaint RESPIRATORY: No cough, wheezing, SOB CARDIAC: No chest pain, palpitations, lower extremity edema  GI: No abdominal pain, No N/V/D or constipation, No heartburn or reflux  GU: No dysuria, frequency or urgency, or incontinence  MUSCULOSKELETAL: No unrelieved bone/joint pain NEUROLOGIC: No headache, dizziness  PSYCHIATRIC: No overt anxiety or sadness  Vitals:   08/19/17 1400  BP: 126/68  Pulse: 82  Resp: 20  Temp: 98.6 F (37 C)  SpO2: 99%   Body mass index is 33.12 kg/m. Physical Exam  GENERAL APPEARANCE: Alert, conversant, No acute distress  SKIN: No diaphoresis rash HEENT: Unremarkable RESPIRATORY: Breathing is even, unlabored. Lung sounds are clear   CARDIOVASCULAR: Heart RRR no murmurs, rubs or gallops. No peripheral edema  GASTROINTESTINAL: Abdomen is soft, non-tender, not distended w/ normal bowel sounds.  GENITOURINARY: Bladder non tender, not distended  MUSCULOSKELETAL: No abnormal joints or musculature NEUROLOGIC: Cranial nerves 2-12 grossly intact. Moves all extremities PSYCHIATRIC: Mood and affect appropriate with some dementia, no behavioral issues  Patient Active Problem List   Diagnosis Date Noted  . Major depressive disorder, single episode, unspecified 06/20/2017  . Peripheral vascular disease, unspecified ([place]) 04/05/2017  . Vascular dementia without behavioral disturbance 02/06/2017  . CKD (chronic kidney disease) stage 3, GFR 30-59 ml/min (HCC) 04/14/2016  . Hyperlipidemia 03/17/2016  . Fever 05/10/2014  . Nausea with vomiting 03/14/2014  . Mouth pain 11/17/2013  . Anxiety state 02/25/2013  .  Lethargy 02/25/2013  . Osteoarthritis 02/04/2013  . Edema 02/04/2013  . Other convulsions 11/27/2012  . Unspecified constipation 11/27/2012  . History of pulmonary embolism 10/12/2012  . Long term current use of anticoagulant therapy 10/12/2012  . E. coli UTI 10/12/2012  . Essential hypertension, benign 10/12/2012  . Depression 10/12/2012  . Hypothyroidism 10/12/2012  . Dementia, multi-infarct 10/12/2012  . GERD (gastroesophageal reflux disease) 10/12/2012  . Psychosis ([place]) 10/12/2012  . Hypokalemia 10/12/2012  . Hyponatremia 10/12/2012    CMP     Component Value Date/Time   NA 137 02/10/2017   K 4.1 02/10/2017   CL 98 08/28/2010 1845   CO2 31 08/28/2010 1845   GLUCOSE 89 08/28/2010 1845   BUN 19 02/10/2017   CREATININE 0.8 02/10/2017   CREATININE 1.0 08/28/2010 1845   CALCIUM 9.6 08/28/2010 1845   AST 20 02/10/2017   ALT 11 02/10/2017   ALKPHOS 75 02/10/2017   GFRNONAA 54 (L) 08/28/2010 1845   GFRAA  08/28/2010 1845    >60  The eGFR has been calculated using the MDRD equation. This calculation has not been validated in all clinical situations. eGFR's persistently <60 mL/min signify possible Chronic Kidney Disease.   Recent Labs    02/10/17  NA 137  K 4.1  BUN 19  CREATININE 0.8   Recent Labs    02/10/17  AST 20  ALT 11  ALKPHOS 75   Recent Labs    02/10/17  WBC 4.1  HGB 14.2  HCT 41  PLT 177   Recent Labs    02/10/17  CHOL 210*  LDLCALC 123  TRIG 49   No results found for: [facility] Lab Results  Component Value Date   TSH 1.09 02/10/2017   Lab Results  Component Value Date   HGBA1C 5.5 02/10/2017   Lab Results  Component Value Date   CHOL 210 (A) 02/10/2017   HDL 77 (A) 02/10/2017   LDLCALC 123 02/10/2017   TRIG 49 02/10/2017    Significant Diagnostic Results in last 30 days:  No results found.  Assessment and Plan  Dementia, multi-infarct Stable; no declines; continue Namenda 10 mg by mouth twice a  day  Essential hypertension, benign Controlled; continue lisinopril 5 mg by mouth daily  Peripheral vascular disease, unspecified ([place]) No breakdowns or wounds; continue Xarelto 20 mg by mouth daily     Anne D. [name], MD

## 2017-08-23 ENCOUNTER — Encounter: Payer: Self-pay | Admitting: Internal Medicine

## 2017-08-23 NOTE — Assessment & Plan Note (Signed)
No breakdowns or wounds; continue Xarelto 20 mg by mouth daily

## 2017-08-23 NOTE — Assessment & Plan Note (Signed)
Controlled; continue lisinopril 5 mg by mouth daily

## 2017-08-23 NOTE — Assessment & Plan Note (Signed)
Stable; no declines; continue Namenda 10 mg by mouth twice a day

## 2017-09-17 ENCOUNTER — Non-Acute Institutional Stay (SKILLED_NURSING_FACILITY): Payer: Medicare Other | Admitting: Internal Medicine

## 2017-09-17 ENCOUNTER — Encounter: Payer: Self-pay | Admitting: Internal Medicine

## 2017-09-17 DIAGNOSIS — K219 Gastro-esophageal reflux disease without esophagitis: Secondary | ICD-10-CM | POA: Diagnosis not present

## 2017-09-17 DIAGNOSIS — F0150 Vascular dementia without behavioral disturbance: Secondary | ICD-10-CM | POA: Diagnosis not present

## 2017-09-17 DIAGNOSIS — F324 Major depressive disorder, single episode, in partial remission: Secondary | ICD-10-CM | POA: Diagnosis not present

## 2017-09-17 NOTE — Progress Notes (Signed)
Location:  [facility] Room Number: [phone] Place of Service:  SNF (31)  [name], MD  Patient Care Team: [name], MD as PCP - General (Internal Medicine)  Extended Emergency Contact Information Primary Emergency Contact: Culclasure,[name]  [place] Phone: [phone] Relation: Other    Allergies: Ciprofloxacin hcl; Codeine; Duloxetine hcl; Hctz [hydrochlorothiazide]; Metaxalone; Morphine and related; Olmesartan; Penicillins; Quetiapine fumarate; and Zofran [ondansetron hcl]  Chief Complaint  Patient presents with  . Medical Management of Chronic Issues    HPI: Patient is 84 y.o. female who is being seen for routine issues of GERD, dementia, and depression.  Past Medical History:  Diagnosis Date  . Anxiety state 02/25/2013  . Chronic kidney disease, stage III (moderate) (HCC)   . CKD (chronic kidney disease) stage 3, GFR 30-59 ml/min (HCC) 04/14/2016  . Dementia, multi-infarct 10/12/2012  . Depression 10/12/2012  . Gastroesophageal reflux disease without esophagitis   . Generalized anxiety disorder   . GERD (gastroesophageal reflux disease) 10/12/2012  . History of falling   . History of pulmonary embolism 10/12/2012  . Hyperlipidemia   . Hypertension   . Hypo-osmolality and hyponatremia   . Hypokalemia   . Hypothyroid   . Long term current use of anticoagulant therapy 10/12/2012  . Major depressive disorder, single episode, unspecified   . OA (osteoarthritis)   . Old myocardial infarction   . Osteoarthritis 02/04/2013  . PE (pulmonary embolism)   . Peripheral vascular disease, unspecified ([place])   . Vascular dementia without behavioral disturbance     Past Surgical History:  Procedure Laterality Date  . JOINT REPLACEMENT     B knees    Allergies as of 09/17/2017      Reactions   Ciprofloxacin Hcl    Codeine    Duloxetine Hcl    Hctz [hydrochlorothiazide]    Metaxalone    Morphine And Related      Olmesartan    Penicillins    Quetiapine Fumarate    Zofran [ondansetron Hcl]       Medication List        Accurate as of 09/17/17 11:59 PM. Always use your most recent med list.          calcium-vitamin D 500-200 MG-UNIT tablet Commonly known as:  OSCAL WITH D Take 1 tablet by mouth daily with breakfast.   DAILY VITE Tabs Take 1 tablet by mouth daily.   DIALYVITE VITAMIN D3 MAX 88416 units Tabs Generic drug:  Cholecalciferol Take 1 tablet by mouth once a week. 12 weeks for Vit D.   docusate sodium 100 MG capsule Commonly known as:  COLACE Take 100 mg by mouth 2 (two) times daily. Hold for loose stool   famotidine 20 MG tablet Commonly known as:  PEPCID Take 20 mg by mouth daily. For reflux   fish oil-omega-3 fatty acids 1000 MG capsule Take 1 g by mouth 2 (two) times daily. For hyperlipidemia   levothyroxine 75 MCG tablet Commonly known as:  SYNTHROID, LEVOTHROID Take 75 mcg by mouth daily. For hypothyroidism   lisinopril 5 MG tablet Commonly known as:  PRINIVIL,ZESTRIL Take 5 mg by mouth daily.   memantine 10 MG tablet Commonly known as:  NAMENDA Take 10 mg by mouth 2 (two) times daily. For dementia   polyethylene glycol packet Commonly known as:  MIRALAX / GLYCOLAX Take 17 g by mouth daily. HOLD FOR LOOSE STOOLS   rivaroxaban 20 MG Tabs tablet Commonly known  as:  XARELTO Take 20 mg by mouth daily with supper.   sodium chloride 1 g tablet Take 1 g by mouth 3 (three) times daily with meals. For sodium supplement   TYLENOL 8 HOUR ARTHRITIS PAIN 650 MG CR tablet Generic drug:  acetaminophen Take 650 mg by mouth every 12 (twelve) hours. For arthritis   venlafaxine XR 75 MG 24 hr capsule Commonly known as:  EFFEXOR-XR Take 75 mg by mouth daily with breakfast.       No orders of the defined types were placed in this encounter.   Immunization History  Administered Date(s) Administered  . Influenza-Unspecified 04/26/2013, 04/21/2015, 05/28/2016,  05/29/2017    Social History   Tobacco Use  . Smoking status: Never Smoker  . Smokeless tobacco: Never Used  Substance Use Topics  . Alcohol use: No    Review of Systems  DATA OBTAINED: from patient, nurse GENERAL:  no fevers, fatigue, appetite changes SKIN: No itching, rash HEENT: No complaint RESPIRATORY: No cough, wheezing, SOB CARDIAC: No chest pain, palpitations, lower extremity edema  GI: No abdominal pain, No N/V/D or constipation, No heartburn or reflux  GU: No dysuria, frequency or urgency, or incontinence  MUSCULOSKELETAL: No unrelieved bone/joint pain NEUROLOGIC: No headache, dizziness  PSYCHIATRIC: No overt anxiety or sadness  Vitals:   09/17/17 1430  BP: 110/64  Pulse: 76  Resp: 18  Temp: (!) 97 F (36.1 C)   Body mass index is 33.41 kg/m. Physical Exam  GENERAL APPEARANCE: Alert, conversant, No acute distress  SKIN: No diaphoresis rash HEENT: Unremarkable RESPIRATORY: Breathing is even, unlabored. Lung sounds are clear   CARDIOVASCULAR: Heart RRR no murmurs, rubs or gallops. No peripheral edema  GASTROINTESTINAL: Abdomen is soft, non-tender, not distended w/ normal bowel sounds.  GENITOURINARY: Bladder non tender, not distended  MUSCULOSKELETAL: No abnormal joints or musculature NEUROLOGIC: Cranial nerves 2-12 grossly intact. Moves all extremities PSYCHIATRIC: Mood and affect appropriate to situation with mild dementia, no behavioral issues  Patient Active Problem List   Diagnosis Date Noted  . Major depressive disorder, single episode, unspecified 06/20/2017  . Peripheral vascular disease, unspecified ([place]) 04/05/2017  . Vascular dementia without behavioral disturbance 02/06/2017  . CKD (chronic kidney disease) stage 3, GFR 30-59 ml/min (HCC) 04/14/2016  . Hyperlipidemia 03/17/2016  . Fever 05/10/2014  . Nausea with vomiting 03/14/2014  . Mouth pain 11/17/2013  . Anxiety state 02/25/2013  . Lethargy 02/25/2013  . Osteoarthritis 02/04/2013    . Edema 02/04/2013  . Other convulsions 11/27/2012  . Unspecified constipation 11/27/2012  . History of pulmonary embolism 10/12/2012  . Long term current use of anticoagulant therapy 10/12/2012  . E. coli UTI 10/12/2012  . Essential hypertension, benign 10/12/2012  . Depression 10/12/2012  . Hypothyroidism 10/12/2012  . Dementia, multi-infarct 10/12/2012  . GERD (gastroesophageal reflux disease) 10/12/2012  . Psychosis ([place]) 10/12/2012  . Hypokalemia 10/12/2012  . Hyponatremia 10/12/2012    CMP     Component Value Date/Time   NA 137 02/10/2017   K 4.1 02/10/2017   CL 98 08/28/2010 1845   CO2 31 08/28/2010 1845   GLUCOSE 89 08/28/2010 1845   BUN 19 02/10/2017   CREATININE 0.8 02/10/2017   CREATININE 1.0 08/28/2010 1845   CALCIUM 9.6 08/28/2010 1845   AST 20 02/10/2017   ALT 11 02/10/2017   ALKPHOS 75 02/10/2017   GFRNONAA 54 (L) 08/28/2010 1845   GFRAA  08/28/2010 1845    >60        The eGFR has been  calculated using the MDRD equation. This calculation has not been validated in all clinical situations. eGFR's persistently <60 mL/min signify possible Chronic Kidney Disease.   Recent Labs    02/10/17  NA 137  K 4.1  BUN 19  CREATININE 0.8   Recent Labs    02/10/17  AST 20  ALT 11  ALKPHOS 75   Recent Labs    02/10/17  WBC 4.1  HGB 14.2  HCT 41  PLT 177   Recent Labs    02/10/17  CHOL 210*  LDLCALC 123  TRIG 49   No results found for: [facility] Lab Results  Component Value Date   TSH 1.09 02/10/2017   Lab Results  Component Value Date   HGBA1C 5.5 02/10/2017   Lab Results  Component Value Date   CHOL 210 (A) 02/10/2017   HDL 77 (A) 02/10/2017   LDLCALC 123 02/10/2017   TRIG 49 02/10/2017    Significant Diagnostic Results in last 30 days:  No results found.  Assessment and Plan  GERD (gastroesophageal reflux disease) No problems reported; continue Pepcid 20 mg by mouth daily  Vascular dementia without behavioral  disturbance Stable; no declines; continue Namenda 10 mg twice a day  Major depressive disorder, single episode, unspecified Affect a little flat; the patient is out of improve daily and is involved in activities; continue Effexor X are 75 mg daily    Anne D. [name], MD

## 2017-09-21 ENCOUNTER — Encounter: Payer: Self-pay | Admitting: Internal Medicine

## 2017-09-21 NOTE — Assessment & Plan Note (Signed)
No problems reported; continue Pepcid 20 mg by mouth daily

## 2017-09-21 NOTE — Assessment & Plan Note (Signed)
Stable; no declines; continue Namenda 10 mg twice a day

## 2017-09-21 NOTE — Assessment & Plan Note (Signed)
Affect a little flat; the patient is out of improve daily and is involved in activities; continue Effexor X are 75 mg daily

## 2017-10-10 ENCOUNTER — Encounter: Payer: Self-pay | Admitting: Internal Medicine

## 2017-10-10 ENCOUNTER — Non-Acute Institutional Stay (SKILLED_NURSING_FACILITY): Payer: Medicare Other | Admitting: Internal Medicine

## 2017-10-10 DIAGNOSIS — E785 Hyperlipidemia, unspecified: Secondary | ICD-10-CM | POA: Diagnosis not present

## 2017-10-10 DIAGNOSIS — Z7901 Long term (current) use of anticoagulants: Secondary | ICD-10-CM | POA: Diagnosis not present

## 2017-10-10 DIAGNOSIS — Z86711 Personal history of pulmonary embolism: Secondary | ICD-10-CM

## 2017-10-10 NOTE — Progress Notes (Signed)
Provider: [name], MD Location:  [place] Room Number: [phone] Place of Service:  SNF (31)  Tricia Duos, MD  Patient Care Team: Tricia Duos, MD as PCP - General (Internal Medicine)  Extended Emergency Contact Information Primary Emergency Contact: Culclasure,[name]  [place] Phone: [phone] Relation: Other    Allergies: Ciprofloxacin hcl; Codeine; Duloxetine hcl; Hctz [hydrochlorothiazide]; Metaxalone; Morphine and related; Olmesartan; Penicillins; Quetiapine fumarate; and Zofran [ondansetron hcl]  Chief Complaint  Patient presents with  . Medical Management of Chronic Issues    HPI: Patient is 84 y.o. female who is being seen for h/o PE, HLD and long term anti-coag use.  Past Medical History:  Diagnosis Date  . Anxiety state 02/25/2013  . Chronic kidney disease, stage III (moderate) (HCC)   . CKD (chronic kidney disease) stage 3, GFR 30-59 ml/min (HCC) 04/14/2016  . Dementia, multi-infarct 10/12/2012  . Depression 10/12/2012  . Gastroesophageal reflux disease without esophagitis   . Generalized anxiety disorder   . GERD (gastroesophageal reflux disease) 10/12/2012  . History of falling   . History of pulmonary embolism 10/12/2012  . Hyperlipidemia   . Hypertension   . Hypo-osmolality and hyponatremia   . Hypokalemia   . Hypothyroid   . Long term current use of anticoagulant therapy 10/12/2012  . Major depressive disorder, single episode, unspecified   . OA (osteoarthritis)   . Old myocardial infarction   . Osteoarthritis 02/04/2013  . PE (pulmonary embolism)   . Peripheral vascular disease, unspecified ([place])   . Vascular dementia without behavioral disturbance     Past Surgical History:  Procedure Laterality Date  . JOINT REPLACEMENT     B knees    Allergies as of 10/10/2017      Reactions   Ciprofloxacin Hcl    Codeine    Duloxetine Hcl    Hctz [hydrochlorothiazide]    Metaxalone    Morphine And Related    Olmesartan    Penicillins    Quetiapine Fumarate    Zofran [ondansetron Hcl]       Medication List        Accurate as of 10/10/17 11:59 PM. Always use your most recent med list.          calcium-vitamin D 500-200 MG-UNIT tablet Commonly known as:  OSCAL WITH D Take 1 tablet by mouth daily with breakfast.   DAILY VITE Tabs Take 1 tablet by mouth daily.   DIALYVITE VITAMIN D3 MAX 47425 units Tabs Generic drug:  Cholecalciferol Take 1 tablet by mouth once a week. 12 weeks for Vit D.   docusate sodium 100 MG capsule Commonly known as:  COLACE Take 100 mg by mouth 2 (two) times daily. Hold for loose stool   famotidine 20 MG tablet Commonly known as:  PEPCID Take 20 mg by mouth daily. For reflux   fish oil-omega-3 fatty acids 1000 MG capsule Take 1 g by mouth 2 (two) times daily. For hyperlipidemia   levothyroxine 75 MCG tablet Commonly known as:  SYNTHROID, LEVOTHROID Take 75 mcg by mouth daily. For hypothyroidism   lisinopril 5 MG tablet Commonly known as:  PRINIVIL,ZESTRIL Take 5 mg by mouth daily.   memantine 10 MG tablet Commonly known as:  NAMENDA Take 10 mg by mouth 2 (two) times daily. For dementia   polyethylene glycol packet Commonly known as:  MIRALAX / GLYCOLAX Take 17 g by mouth daily. HOLD FOR LOOSE STOOLS   rivaroxaban 20 MG  Tabs tablet Commonly known as:  XARELTO Take 20 mg by mouth daily with supper.   sodium chloride 1 g tablet Take 1 g by mouth 3 (three) times daily with meals. For sodium supplement   TYLENOL 8 HOUR ARTHRITIS PAIN 650 MG CR tablet Generic drug:  acetaminophen Take 650 mg by mouth every 12 (twelve) hours. For arthritis   venlafaxine XR 75 MG 24 hr capsule Commonly known as:  EFFEXOR-XR Take 75 mg by mouth daily with breakfast.       No orders of the defined types were placed in this encounter.   Immunization History  Administered Date(s) Administered  . Influenza-Unspecified 04/26/2013,  04/21/2015, 05/28/2016, 05/29/2017    Social History   Tobacco Use  . Smoking status: Never Smoker  . Smokeless tobacco: Never Used  Substance Use Topics  . Alcohol use: No    Review of Systems  DATA OBTAINED: from patient, nurse GENERAL:  no fevers, fatigue, appetite changes SKIN: No itching, rash HEENT: No complaint RESPIRATORY: No cough, wheezing, SOB CARDIAC: No chest pain, palpitations, lower extremity edema  GI: No abdominal pain, No N/V/D or constipation, No heartburn or reflux  GU: No dysuria, frequency or urgency, or incontinence  MUSCULOSKELETAL: No unrelieved bone/joint pain NEUROLOGIC: No headache, dizziness  PSYCHIATRIC: No overt anxiety or sadness  Vitals:   10/10/17 1105  BP: 132/78  Pulse: 67  Resp: 18  Temp: (!) 97 F (36.1 C)   Body mass index is 33.96 kg/m. Physical Exam  GENERAL APPEARANCE: Alert, conversant, No acute distress  SKIN: No diaphoresis rash HEENT: Unremarkable RESPIRATORY: Breathing is even, unlabored. Lung sounds are clear   CARDIOVASCULAR: Heart RRR no murmurs, rubs or gallops. No peripheral edema  GASTROINTESTINAL: Abdomen is soft, non-tender, not distended w/ normal bowel sounds.  GENITOURINARY: Bladder non tender, not distended  MUSCULOSKELETAL: No abnormal joints or musculature NEUROLOGIC: Cranial nerves 2-12 grossly intact. Moves all extremities PSYCHIATRIC: Mood and affect appropriate to situation, no behavioral issues  Patient Active Problem List   Diagnosis Date Noted  . Major depressive disorder, single episode, unspecified 06/20/2017  . Peripheral vascular disease, unspecified ([place]) 04/05/2017  . Vascular dementia without behavioral disturbance 02/06/2017  . CKD (chronic kidney disease) stage 3, GFR 30-59 ml/min (HCC) 04/14/2016  . Hyperlipidemia 03/17/2016  . Fever 05/10/2014  . Nausea with vomiting 03/14/2014  . Mouth pain 11/17/2013  . Anxiety state 02/25/2013  . Lethargy 02/25/2013  . Osteoarthritis  02/04/2013  . Edema 02/04/2013  . Other convulsions 11/27/2012  . Unspecified constipation 11/27/2012  . History of pulmonary embolism 10/12/2012  . Long term current use of anticoagulant therapy 10/12/2012  . E. coli UTI 10/12/2012  . Essential hypertension, benign 10/12/2012  . Depression 10/12/2012  . Hypothyroidism 10/12/2012  . Dementia, multi-infarct 10/12/2012  . GERD (gastroesophageal reflux disease) 10/12/2012  . Psychosis ([place]) 10/12/2012  . Hypokalemia 10/12/2012  . Hyponatremia 10/12/2012    CMP     Component Value Date/Time   NA 137 02/10/2017   K 4.1 02/10/2017   CL 98 08/28/2010 1845   CO2 31 08/28/2010 1845   GLUCOSE 89 08/28/2010 1845   BUN 19 02/10/2017   CREATININE 0.8 02/10/2017   CREATININE 1.0 08/28/2010 1845   CALCIUM 9.6 08/28/2010 1845   AST 20 02/10/2017   ALT 11 02/10/2017   ALKPHOS 75 02/10/2017   GFRNONAA 54 (L) 08/28/2010 1845   GFRAA  08/28/2010 1845    >60        The eGFR has been  calculated using the MDRD equation. This calculation has not been validated in all clinical situations. eGFR's persistently <60 mL/min signify possible Chronic Kidney Disease.   Recent Labs    02/10/17  NA 137  K 4.1  BUN 19  CREATININE 0.8   Recent Labs    02/10/17  AST 20  ALT 11  ALKPHOS 75   Recent Labs    02/10/17  WBC 4.1  HGB 14.2  HCT 41  PLT 177   Recent Labs    02/10/17  CHOL 210*  LDLCALC 123  TRIG 49   No results found for: [facility] Lab Results  Component Value Date   TSH 1.09 02/10/2017   Lab Results  Component Value Date   HGBA1C 5.5 02/10/2017   Lab Results  Component Value Date   CHOL 210 (A) 02/10/2017   HDL 77 (A) 02/10/2017   LDLCALC 123 02/10/2017   TRIG 49 02/10/2017    Significant Diagnostic Results in last 30 days:  No results found.  Assessment and Plan  History of pulmonary embolism Chronic; cont xarelto 20 mg daily  Hyperlipidemia Pt has new labs ordered, but LDL was 123 and HDL 77;plan  to cont omega 3 1 gm daily  Long term current use of anticoagulant therapy For chronic PR, Xarelto 20 mg daily     Dyneshia Baccam D. [name], MD

## 2017-10-12 ENCOUNTER — Encounter: Payer: Self-pay | Admitting: Internal Medicine

## 2017-10-12 NOTE — Assessment & Plan Note (Signed)
Pt has new labs ordered, but LDL was 123 and HDL 77;plan to cont omega 3 1 gm daily

## 2017-10-12 NOTE — Assessment & Plan Note (Signed)
For chronic PR, Xarelto 20 mg daily

## 2017-10-12 NOTE — Assessment & Plan Note (Signed)
Chronic; cont xarelto 20 mg daily

## 2017-10-14 LAB — HEMOGLOBIN A1C
A1c: 5.3
Hemoglobin A1C: 5.3

## 2017-10-14 LAB — CBC AND DIFFERENTIAL
HCT: 39 (ref 36–46)
HEMOGLOBIN: 13.8 (ref 12.0–16.0)
PLATELETS: 211 (ref 150–399)
WBC: 4.4

## 2017-10-14 LAB — CBC
HCT: 39.2
HGB: 13.8
WBC: 4.4
platelet count: 211

## 2017-10-14 LAB — BASIC METABOLIC PANEL
CREATININE: 0.8 (ref 0.5–1.1)
Glucose: 88
Potassium: 4.4 (ref 3.4–5.3)
SODIUM: 139 (ref 137–147)

## 2017-10-14 LAB — COMPLETE METABOLIC PANEL WITH GFR
ALK PHOS: 87
ALT: 11
AST: 19
Albumin: 4
BUN: 19 (ref 4–21)
CREATININE: 0.84
Calcium: 9.1
Glucose: 88
POTASSIUM: 4.4
SODIUM: 139
TOTAL PROTEIN: 6 g/dL
Total Bilirubin: 0.2

## 2017-10-14 LAB — HEPATIC FUNCTION PANEL
ALK PHOS: 87 (ref 25–125)
ALT: 11 (ref 7–35)
AST: 19 (ref 13–35)
Bilirubin, Total: 0.2

## 2017-10-14 LAB — TSH
TSH: 1.68
TSH: 1.68 (ref 0.41–5.90)

## 2017-10-15 ENCOUNTER — Encounter: Payer: Self-pay | Admitting: *Deleted

## 2017-11-14 ENCOUNTER — Non-Acute Institutional Stay (SKILLED_NURSING_FACILITY): Payer: Medicare Other | Admitting: Internal Medicine

## 2017-11-14 ENCOUNTER — Encounter: Payer: Self-pay | Admitting: Internal Medicine

## 2017-11-14 DIAGNOSIS — I739 Peripheral vascular disease, unspecified: Secondary | ICD-10-CM | POA: Diagnosis not present

## 2017-11-14 DIAGNOSIS — F0150 Vascular dementia without behavioral disturbance: Secondary | ICD-10-CM

## 2017-11-14 DIAGNOSIS — F324 Major depressive disorder, single episode, in partial remission: Secondary | ICD-10-CM | POA: Diagnosis not present

## 2017-12-07 ENCOUNTER — Encounter: Payer: Self-pay | Admitting: Internal Medicine

## 2017-12-07 NOTE — Assessment & Plan Note (Signed)
Patient is out of room daily and involved in activities; appears stable; continue Effexor 75 mg daily

## 2017-12-07 NOTE — Assessment & Plan Note (Signed)
No reported skin breakdown; continue Xarelto 20 mg daily

## 2017-12-07 NOTE — Progress Notes (Signed)
Location:  [facility] Room Number: 417-W Place of Service:  SNF (31)  [name], MD  Patient Care Team: [name], MD as PCP - General (Internal Medicine)  Extended Emergency Contact Information Primary Emergency Contact: Culclasure,[name]  [place] Phone: [phone] Relation: Other    Allergies: Ciprofloxacin hcl; Codeine; Duloxetine hcl; Hctz [hydrochlorothiazide]; Metaxalone; Morphine and related; Olmesartan; Penicillins; Quetiapine fumarate; and Zofran [ondansetron hcl]  Chief Complaint  Patient presents with  . Medical Management of Chronic Issues    HPI: Patient is 84 y.o. female who is being to seen for routine issues of dementia, peripheral vascular disease, and depression.  Past Medical History:  Diagnosis Date  . Anxiety state 02/25/2013  . Chronic kidney disease, stage III (moderate) (HCC)   . CKD (chronic kidney disease) stage 3, GFR 30-59 ml/min (HCC) 04/14/2016  . Dementia, multi-infarct 10/12/2012  . Depression 10/12/2012  . Gastroesophageal reflux disease without esophagitis   . Generalized anxiety disorder   . GERD (gastroesophageal reflux disease) 10/12/2012  . History of falling   . History of pulmonary embolism 10/12/2012  . Hyperlipidemia   . Hypertension   . Hypo-osmolality and hyponatremia   . Hypokalemia   . Hypothyroid   . Long term current use of anticoagulant therapy 10/12/2012  . Major depressive disorder, single episode, unspecified   . OA (osteoarthritis)   . Old myocardial infarction   . Osteoarthritis 02/04/2013  . PE (pulmonary embolism)   . Peripheral vascular disease, unspecified ([place])   . Vascular dementia without behavioral disturbance     Past Surgical History:  Procedure Laterality Date  . JOINT REPLACEMENT     B knees    Allergies as of 11/14/2017      Reactions   Ciprofloxacin Hcl    Codeine    Duloxetine Hcl    Hctz [hydrochlorothiazide]    Metaxalone     Morphine And Related    Olmesartan    Penicillins    Quetiapine Fumarate    Zofran [ondansetron Hcl]       Medication List        Accurate as of 11/14/17 11:59 PM. Always use your most recent med list.          calcium-vitamin D 500-200 MG-UNIT tablet Commonly known as:  OSCAL WITH D Take 1 tablet by mouth daily with breakfast.   DAILY VITE Tabs Take 1 tablet by mouth daily.   DIALYVITE VITAMIN D3 MAX 83419 units Tabs Generic drug:  Cholecalciferol Take 1 tablet by mouth once a week. 12 weeks for Vit D.   docusate sodium 100 MG capsule Commonly known as:  COLACE Take 100 mg by mouth 2 (two) times daily. Hold for loose stool   famotidine 20 MG tablet Commonly known as:  PEPCID Take 20 mg by mouth daily. For reflux   fish oil-omega-3 fatty acids 1000 MG capsule Take 1 g by mouth 2 (two) times daily. For hyperlipidemia   levothyroxine 75 MCG tablet Commonly known as:  SYNTHROID, LEVOTHROID Take 75 mcg by mouth daily. For hypothyroidism   lisinopril 5 MG tablet Commonly known as:  PRINIVIL,ZESTRIL Take 5 mg by mouth daily.   memantine 10 MG tablet Commonly known as:  NAMENDA Take 10 mg by mouth 2 (two) times daily. For dementia   polyethylene glycol packet Commonly known as:  MIRALAX / GLYCOLAX Take 17 g by mouth daily. HOLD FOR LOOSE STOOLS   rivaroxaban 20 MG Tabs tablet  Commonly known as:  XARELTO Take 20 mg by mouth daily with supper.   sodium chloride 1 g tablet Take 1 g by mouth 3 (three) times daily with meals. For sodium supplement   TYLENOL 8 HOUR ARTHRITIS PAIN 650 MG CR tablet Generic drug:  acetaminophen Take 650 mg by mouth every 12 (twelve) hours. For arthritis   venlafaxine XR 75 MG 24 hr capsule Commonly known as:  EFFEXOR-XR Take 75 mg by mouth daily with breakfast.       No orders of the defined types were placed in this encounter.   Immunization History  Administered Date(s) Administered  . Influenza-Unspecified 04/26/2013,  04/21/2015, 05/28/2016, 05/29/2017    Social History   Tobacco Use  . Smoking status: Never Smoker  . Smokeless tobacco: Never Used  Substance Use Topics  . Alcohol use: No    Review of Systems  DATA OBTAINED: from patient, nurse GENERAL:  no fevers, fatigue, appetite changes SKIN: No itching, rash HEENT: No complaint RESPIRATORY: No cough, wheezing, SOB CARDIAC: No chest pain, palpitations, lower extremity edema  GI: No abdominal pain, No N/V/D or constipation, No heartburn or reflux  GU: No dysuria, frequency or urgency, or incontinence  MUSCULOSKELETAL: No unrelieved bone/joint pain NEUROLOGIC: No headache, dizziness  PSYCHIATRIC: No overt anxiety or sadness  Vitals:   11/14/17 1508  BP: 117/83  Pulse: 74  Resp: 18  Temp: (!) 97.2 F (36.2 C)   Body mass index is 34.22 kg/m. Physical Exam  GENERAL APPEARANCE: Alert, conversant, No acute distress  SKIN: No diaphoresis rash HEENT: Unremarkable RESPIRATORY: Breathing is even, unlabored. Lung sounds are clear   CARDIOVASCULAR: Heart RRR no murmurs, rubs or gallops. No peripheral edema  GASTROINTESTINAL: Abdomen is soft, non-tender, not distended w/ normal bowel sounds.  GENITOURINARY: Bladder non tender, not distended  MUSCULOSKELETAL: No abnormal joints or musculature NEUROLOGIC: Cranial nerves 2-12 grossly intact. Moves all extremities PSYCHIATRIC: Mood and affect appropriate to situation, no behavioral issues  Patient Active Problem List   Diagnosis Date Noted  . Major depressive disorder, single episode, unspecified 06/20/2017  . Peripheral vascular disease, unspecified ([place]) 04/05/2017  . Vascular dementia without behavioral disturbance 02/06/2017  . CKD (chronic kidney disease) stage 3, GFR 30-59 ml/min (HCC) 04/14/2016  . Hyperlipidemia 03/17/2016  . Fever 05/10/2014  . Nausea with vomiting 03/14/2014  . Mouth pain 11/17/2013  . Anxiety state 02/25/2013  . Lethargy 02/25/2013  . Osteoarthritis  02/04/2013  . Edema 02/04/2013  . Other convulsions 11/27/2012  . Unspecified constipation 11/27/2012  . History of pulmonary embolism 10/12/2012  . Long term current use of anticoagulant therapy 10/12/2012  . E. coli UTI 10/12/2012  . Essential hypertension, benign 10/12/2012  . Depression 10/12/2012  . Hypothyroidism 10/12/2012  . Dementia, multi-infarct 10/12/2012  . GERD (gastroesophageal reflux disease) 10/12/2012  . Psychosis ([place]) 10/12/2012  . Hypokalemia 10/12/2012  . Hyponatremia 10/12/2012    CMP     Component Value Date/Time   NA 139 10/14/2017   K 4.4 10/14/2017   CL 98 08/28/2010 1845   CO2 31 08/28/2010 1845   GLUCOSE 89 08/28/2010 1845   BUN 19 10/14/2017   CREATININE 0.84 10/14/2017   CALCIUM 9.1 10/14/2017   PROT 6 10/14/2017   ALBUMIN 4 10/14/2017   AST 19 10/14/2017   ALT 11 10/14/2017   ALKPHOS 87 10/14/2017   BILITOT 0.2 10/14/2017   GFRNONAA 54 (L) 08/28/2010 1845   GFRAA  08/28/2010 1845    >60  The eGFR has been calculated using the MDRD equation. This calculation has not been validated in all clinical situations. eGFR's persistently <60 mL/min signify possible Chronic Kidney Disease.   Recent Labs    02/10/17 10/14/17  NA 137 139  K 4.1 4.4  BUN 19 19  CREATININE 0.8 0.84  CALCIUM  --  9.1   Recent Labs    02/10/17 10/14/17  AST 20 19  ALT 11 11  ALKPHOS 75 87  BILITOT  --  0.2  PROT  --  6  ALBUMIN  --  4   Recent Labs    02/10/17 10/14/17  WBC 4.1 4.4  HGB 14.2 13.8  HCT 41 39.2  PLT 177  --    Recent Labs    02/10/17  CHOL 210*  LDLCALC 123  TRIG 49   No results found for: [facility] Lab Results  Component Value Date   TSH 1.68 10/14/2017   Lab Results  Component Value Date   HGBA1C 5.5 02/10/2017   Lab Results  Component Value Date   CHOL 210 (A) 02/10/2017   HDL 77 (A) 02/10/2017   LDLCALC 123 02/10/2017   TRIG 49 02/10/2017    Significant Diagnostic Results in last 30 days:  No results  found.  Assessment and Plan  Vascular dementia without behavioral disturbance Mild, stable; continue Namenda 10 mg twice daily  Peripheral vascular disease, unspecified (HCC) No reported skin breakdown; continue Xarelto 20 mg daily  Major depressive disorder, single episode, unspecified Patient is out of room daily and involved in activities; appears stable; continue Effexor 75 mg daily     Inocencio Homes, MD

## 2017-12-07 NOTE — Assessment & Plan Note (Signed)
Mild, stable; continue Namenda 10 mg twice daily

## 2017-12-12 ENCOUNTER — Non-Acute Institutional Stay (SKILLED_NURSING_FACILITY): Payer: Medicare Other | Admitting: Internal Medicine

## 2017-12-12 ENCOUNTER — Encounter: Payer: Self-pay | Admitting: Internal Medicine

## 2017-12-12 DIAGNOSIS — F0150 Vascular dementia without behavioral disturbance: Secondary | ICD-10-CM

## 2017-12-12 DIAGNOSIS — F329 Major depressive disorder, single episode, unspecified: Secondary | ICD-10-CM

## 2017-12-12 DIAGNOSIS — I10 Essential (primary) hypertension: Secondary | ICD-10-CM | POA: Diagnosis not present

## 2017-12-12 DIAGNOSIS — F32A Depression, unspecified: Secondary | ICD-10-CM

## 2017-12-12 NOTE — Progress Notes (Signed)
Location:  [facility] [place] Room Number: 417-W Place of Service:  SNF (31)  Tricia Duos, MD  Patient Care Team: Tricia Duos, MD as PCP - General (Internal Medicine)  Extended Emergency Contact Information Primary Emergency Contact: Culclasure,[name]  [place] Phone: [phone] Relation: Other    Allergies: Ciprofloxacin hcl; Codeine; Duloxetine hcl; Hctz [hydrochlorothiazide]; Metaxalone; Morphine and related; Olmesartan; Penicillins; Quetiapine fumarate; and Zofran [ondansetron hcl]  Chief Complaint  Patient presents with  . Medical Management of Chronic Issues    Routine Visit     HPI: Patient is 84 y.o. female who is being seen for routine issues of hypertension, depression, and dementia.  Past Medical History:  Diagnosis Date  . Anxiety state 02/25/2013  . Chronic kidney disease, stage III (moderate) (HCC)   . CKD (chronic kidney disease) stage 3, GFR 30-59 ml/min (HCC) 04/14/2016  . Dementia, multi-infarct 10/12/2012  . Depression 10/12/2012  . Gastroesophageal reflux disease without esophagitis   . Generalized anxiety disorder   . GERD (gastroesophageal reflux disease) 10/12/2012  . History of falling   . History of pulmonary embolism 10/12/2012  . Hyperlipidemia   . Hypertension   . Hypo-osmolality and hyponatremia   . Hypokalemia   . Hypothyroid   . Long term current use of anticoagulant therapy 10/12/2012  . Major depressive disorder, single episode, unspecified   . OA (osteoarthritis)   . Old myocardial infarction   . Osteoarthritis 02/04/2013  . PE (pulmonary embolism)   . Peripheral vascular disease, unspecified ([place])   . Vascular dementia without behavioral disturbance     Past Surgical History:  Procedure Laterality Date  . JOINT REPLACEMENT     B knees    Allergies as of 12/12/2017      Reactions   Ciprofloxacin Hcl    Codeine    Duloxetine Hcl    Hctz [hydrochlorothiazide]    Metaxalone    Morphine And Related    Olmesartan    Penicillins    Quetiapine Fumarate    Zofran [ondansetron Hcl]       Medication List        Accurate as of 12/12/17 11:59 PM. Always use your most recent med list.          calcium-vitamin D 500-200 MG-UNIT tablet Commonly known as:  OSCAL WITH D Take 1 tablet by mouth daily with breakfast.   DAILY VITE Tabs Take 1 tablet by mouth daily.   DIALYVITE VITAMIN D3 MAX 98338 units Tabs Generic drug:  Cholecalciferol Take 1 tablet by mouth once a week. 12 weeks for Vit D.   docusate sodium 100 MG capsule Commonly known as:  COLACE Take 100 mg by mouth 2 (two) times daily. Hold for loose stool   famotidine 20 MG tablet Commonly known as:  PEPCID Take 20 mg by mouth daily. For reflux   fish oil-omega-3 fatty acids 1000 MG capsule Take 1 g by mouth 2 (two) times daily. For hyperlipidemia   levothyroxine 75 MCG tablet Commonly known as:  SYNTHROID, LEVOTHROID Take 75 mcg by mouth daily. For hypothyroidism   lisinopril 5 MG tablet Commonly known as:  PRINIVIL,ZESTRIL Take 5 mg by mouth daily.   memantine 10 MG tablet Commonly known as:  NAMENDA Take 10 mg by mouth 2 (two) times daily. For dementia   polyethylene glycol packet Commonly known as:  MIRALAX / GLYCOLAX Take 17 g by mouth daily. HOLD FOR LOOSE STOOLS   rivaroxaban  20 MG Tabs tablet Commonly known as:  XARELTO Take 20 mg by mouth daily with supper.   sodium chloride 1 g tablet Take 1 g by mouth 3 (three) times daily with meals. For sodium supplement   TYLENOL 8 HOUR ARTHRITIS PAIN 650 MG CR tablet Generic drug:  acetaminophen Take 650 mg by mouth every 12 (twelve) hours. For arthritis   venlafaxine XR 75 MG 24 hr capsule Commonly known as:  EFFEXOR-XR Take 75 mg by mouth daily with breakfast.       No orders of the defined types were placed in this encounter.   Immunization History  Administered Date(s) Administered  . Influenza-Unspecified  04/26/2013, 04/21/2015, 05/28/2016, 05/29/2017  . Pneumococcal Conjugate-13 07/06/2017    Social History   Tobacco Use  . Smoking status: Never Smoker  . Smokeless tobacco: Never Used  Substance Use Topics  . Alcohol use: No    Review of Systems  DATA OBTAINED: from patient, nurse GENERAL:  no fevers, fatigue, appetite changes SKIN: No itching, rash HEENT: No complaint RESPIRATORY: No cough, wheezing, SOB CARDIAC: No chest pain, palpitations, lower extremity edema  GI: No abdominal pain, No N/V/D or constipation, No heartburn or reflux  GU: No dysuria, frequency or urgency, or incontinence  MUSCULOSKELETAL: No unrelieved bone/joint pain NEUROLOGIC: No headache, dizziness  PSYCHIATRIC: No overt anxiety or sadness  Vitals:   12/12/17 1346  BP: 134/80  Pulse: 74  Resp: 18  Temp: 97.8 F (36.6 C)   Body mass index is 33.41 kg/m. Physical Exam  GENERAL APPEARANCE: Alert, conversant, No acute distress  SKIN: No diaphoresis rash HEENT: Unremarkable RESPIRATORY: Breathing is even, unlabored. Lung sounds are clear   CARDIOVASCULAR: Heart RRR no murmurs, rubs or gallops. No peripheral edema  GASTROINTESTINAL: Abdomen is soft, non-tender, not distended w/ normal bowel sounds.  GENITOURINARY: Bladder non tender, not distended  MUSCULOSKELETAL: No abnormal joints or musculature NEUROLOGIC: Cranial nerves 2-12 grossly intact. Moves all extremities PSYCHIATRIC: Mood and affect appropriate to situation, no behavioral issues  Patient Active Problem List   Diagnosis Date Noted  . Major depressive disorder, single episode, unspecified 06/20/2017  . Peripheral vascular disease, unspecified ([place]) 04/05/2017  . Vascular dementia without behavioral disturbance 02/06/2017  . CKD (chronic kidney disease) stage 3, GFR 30-59 ml/min (HCC) 04/14/2016  . Hyperlipidemia 03/17/2016  . Fever 05/10/2014  . Nausea with vomiting 03/14/2014  . Mouth pain 11/17/2013  . Anxiety state 02/25/2013    . Lethargy 02/25/2013  . Osteoarthritis 02/04/2013  . Edema 02/04/2013  . Other convulsions 11/27/2012  . Unspecified constipation 11/27/2012  . History of pulmonary embolism 10/12/2012  . Long term current use of anticoagulant therapy 10/12/2012  . E. coli UTI 10/12/2012  . Essential hypertension, benign 10/12/2012  . Depression 10/12/2012  . Hypothyroidism 10/12/2012  . Dementia, multi-infarct 10/12/2012  . GERD (gastroesophageal reflux disease) 10/12/2012  . Psychosis ([place]) 10/12/2012  . Hypokalemia 10/12/2012  . Hyponatremia 10/12/2012    CMP     Component Value Date/Time   NA 139 10/14/2017   NA 139 10/14/2017   K 4.4 10/14/2017   K 4.4 10/14/2017   CL 98 08/28/2010 1845   CO2 31 08/28/2010 1845   GLUCOSE 89 08/28/2010 1845   BUN 19 10/14/2017   CREATININE 0.84 10/14/2017   CREATININE 0.8 10/14/2017   CALCIUM 9.1 10/14/2017   PROT 6 10/14/2017   ALBUMIN 4 10/14/2017   AST 19 10/14/2017   AST 19 10/14/2017   ALT 11 10/14/2017   ALT  11 10/14/2017   ALKPHOS 87 10/14/2017   ALKPHOS 87 10/14/2017   BILITOT 0.2 10/14/2017   GFRNONAA 54 (L) 08/28/2010 1845   GFRAA  08/28/2010 1845    >60        The eGFR has been calculated using the MDRD equation. This calculation has not been validated in all clinical situations. eGFR's persistently <60 mL/min signify possible Chronic Kidney Disease.   Recent Labs    02/10/17 10/14/17  NA 137 139  139  K 4.1 4.4  4.4  BUN 19 19  CREATININE 0.8 0.8  0.84  CALCIUM  --  9.1   Recent Labs    02/10/17 10/14/17  AST _0 ALT _1 ALKPHOS 75 87  87  BILITOT  --  0.2  PROT  --  6  ALBUMIN  --  4   Recent Labs    02/10/17 10/14/17  WBC 4.1 4.4  4.4  HGB 14.2 13.8  13.8  HCT 41 39  39.2  PLT 177 211   Recent Labs    02/10/17  CHOL 210*  LDLCALC 123  TRIG 49   No results found for: [facility] Lab Results  Component Value Date   TSH 1.68 10/14/2017   TSH 1.68 10/14/2017   Lab Results   Component Value Date   HGBA1C 5.3 10/14/2017   Lab Results  Component Value Date   CHOL 210 (A) 02/10/2017   HDL 77 (A) 02/10/2017   LDLCALC 123 02/10/2017   TRIG 49 02/10/2017    Significant Diagnostic Results in last 30 days:  No results found.  Assessment and Plan  Essential hypertension, benign On lisinopril 5 mg p.o. daily and controlled  Depression Controlled; continue Effexor 75 mg daily  Dementia, multi-infarct No major decline; continue Namenda 10 mg twice daily      D. [name], M.D.

## 2018-01-05 ENCOUNTER — Non-Acute Institutional Stay (SKILLED_NURSING_FACILITY): Payer: Medicare Other | Admitting: Internal Medicine

## 2018-01-05 ENCOUNTER — Encounter: Payer: Self-pay | Admitting: Internal Medicine

## 2018-01-05 DIAGNOSIS — Z86711 Personal history of pulmonary embolism: Secondary | ICD-10-CM | POA: Diagnosis not present

## 2018-01-05 DIAGNOSIS — K219 Gastro-esophageal reflux disease without esophagitis: Secondary | ICD-10-CM | POA: Diagnosis not present

## 2018-01-05 DIAGNOSIS — Z7901 Long term (current) use of anticoagulants: Secondary | ICD-10-CM | POA: Diagnosis not present

## 2018-01-05 NOTE — Progress Notes (Signed)
Location:  [facility] [place] Room Number: [phone] Place of Service:  SNF (31)  Tricia Duos, MD  Patient Care Team: Tricia Duos, MD as PCP - General (Internal Medicine)  Extended Emergency Contact Information Primary Emergency Contact: Culclasure,[name]  [place] Phone: [phone] Relation: Other    Allergies: Ciprofloxacin hcl; Codeine; Duloxetine hcl; Hctz [hydrochlorothiazide]; Metaxalone; Morphine and related; Olmesartan; Penicillins; Quetiapine fumarate; and Zofran [ondansetron hcl]  Chief Complaint  Patient presents with  . Medical Management of Chronic Issues    Routine Visit    HPI: Patient is 84 y.o. female who is being seen for routine issues of GERD, PE, and chronic anticoagulation use.  Past Medical History:  Diagnosis Date  . Anxiety state 02/25/2013  . Chronic kidney disease, stage III (moderate) (HCC)   . CKD (chronic kidney disease) stage 3, GFR 30-59 ml/min (HCC) 04/14/2016  . Dementia, multi-infarct 10/12/2012  . Depression 10/12/2012  . Gastroesophageal reflux disease without esophagitis   . Generalized anxiety disorder   . GERD (gastroesophageal reflux disease) 10/12/2012  . History of falling   . History of pulmonary embolism 10/12/2012  . Hyperlipidemia   . Hypertension   . Hypo-osmolality and hyponatremia   . Hypokalemia   . Hypothyroid   . Long term current use of anticoagulant therapy 10/12/2012  . Major depressive disorder, single episode, unspecified   . OA (osteoarthritis)   . Old myocardial infarction   . Osteoarthritis 02/04/2013  . PE (pulmonary embolism)   . Peripheral vascular disease, unspecified ([place])   . Vascular dementia without behavioral disturbance     Past Surgical History:  Procedure Laterality Date  . JOINT REPLACEMENT     B knees    Allergies as of 01/05/2018      Reactions   Ciprofloxacin Hcl    Codeine    Duloxetine Hcl    Hctz [hydrochlorothiazide]    Metaxalone    Morphine And Related    Olmesartan    Penicillins    Quetiapine Fumarate    Zofran [ondansetron Hcl]       Medication List        Accurate as of 01/05/18 11:59 PM. Always use your most recent med list.          calcium-vitamin D 500-200 MG-UNIT tablet Commonly known as:  OSCAL WITH D Take 1 tablet by mouth daily with breakfast.   DAILY VITE Tabs Take 1 tablet by mouth daily.   DIALYVITE VITAMIN D3 MAX 93810 units Tabs Generic drug:  Cholecalciferol Take 1 tablet by mouth once a week. 12 weeks for Vit D.   docusate sodium 100 MG capsule Commonly known as:  COLACE Take 100 mg by mouth 2 (two) times daily. Hold for loose stool   famotidine 20 MG tablet Commonly known as:  PEPCID Take 20 mg by mouth daily. For reflux   fish oil-omega-3 fatty acids 1000 MG capsule Take 1 g by mouth 2 (two) times daily. For hyperlipidemia   levothyroxine 75 MCG tablet Commonly known as:  SYNTHROID, LEVOTHROID Take 75 mcg by mouth daily. For hypothyroidism   lisinopril 5 MG tablet Commonly known as:  PRINIVIL,ZESTRIL Take 5 mg by mouth daily.   memantine 10 MG tablet Commonly known as:  NAMENDA Take 10 mg by mouth 2 (two) times daily. For dementia   polyethylene glycol packet Commonly known as:  MIRALAX / GLYCOLAX Take 17 g by mouth daily. HOLD FOR LOOSE STOOLS   rivaroxaban 20  MG Tabs tablet Commonly known as:  XARELTO Take 20 mg by mouth daily with supper.   sodium chloride 1 g tablet Take 1 g by mouth 3 (three) times daily with meals. For sodium supplement   TYLENOL 8 HOUR ARTHRITIS PAIN 650 MG CR tablet Generic drug:  acetaminophen Take 650 mg by mouth every 12 (twelve) hours. For arthritis   venlafaxine XR 75 MG 24 hr capsule Commonly known as:  EFFEXOR-XR Take 75 mg by mouth daily with breakfast.       No orders of the defined types were placed in this encounter.   Immunization History  Administered Date(s) Administered  . Influenza-Unspecified  04/26/2013, 04/21/2015, 05/28/2016, 05/29/2017  . Pneumococcal Conjugate-13 07/06/2017    Social History   Tobacco Use  . Smoking status: Never Smoker  . Smokeless tobacco: Never Used  Substance Use Topics  . Alcohol use: No    Review of Systems  DATA OBTAINED: from patient, nurse GENERAL:  no fevers, fatigue, appetite changes SKIN: No itching, rash HEENT: No complaint RESPIRATORY: No cough, wheezing, SOB CARDIAC: No chest pain, palpitations, lower extremity edema  GI: No abdominal pain, No N/V/D or constipation, No heartburn or reflux  GU: No dysuria, frequency or urgency, or incontinence  MUSCULOSKELETAL: No unrelieved bone/joint pain NEUROLOGIC: No headache, dizziness  PSYCHIATRIC: No overt anxiety or sadness  Vitals:   01/05/18 1102  BP: 122/77  Pulse: 67  Resp: 18  Temp: (!) 97.1 F (36.2 C)  SpO2: 96%   Body mass index is 33.41 kg/m. Physical Exam  GENERAL APPEARANCE: Alert, conversant, No acute distress  SKIN: No diaphoresis rash HEENT: Unremarkable RESPIRATORY: Breathing is even, unlabored. Lung sounds are clear   CARDIOVASCULAR: Heart RRR no murmurs, rubs or gallops. No peripheral edema  GASTROINTESTINAL: Abdomen is soft, non-tender, not distended w/ normal bowel sounds.  GENITOURINARY: Bladder non tender, not distended  MUSCULOSKELETAL: No abnormal joints or musculature NEUROLOGIC: Cranial nerves 2-12 grossly intact. Moves all extremities PSYCHIATRIC: Mood and affect appropriate , no behavioral issues  Patient Active Problem List   Diagnosis Date Noted  . Major depressive disorder, single episode, unspecified 06/20/2017  . Peripheral vascular disease, unspecified ([place]) 04/05/2017  . Vascular dementia without behavioral disturbance 02/06/2017  . CKD (chronic kidney disease) stage 3, GFR 30-59 ml/min (HCC) 04/14/2016  . Hyperlipidemia 03/17/2016  . Fever 05/10/2014  . Nausea with vomiting 03/14/2014  . Mouth pain 11/17/2013  . Anxiety state  02/25/2013  . Lethargy 02/25/2013  . Osteoarthritis 02/04/2013  . Edema 02/04/2013  . Other convulsions 11/27/2012  . Unspecified constipation 11/27/2012  . History of pulmonary embolism 10/12/2012  . Long term current use of anticoagulant therapy 10/12/2012  . E. coli UTI 10/12/2012  . Essential hypertension, benign 10/12/2012  . Depression 10/12/2012  . Hypothyroidism 10/12/2012  . Dementia, multi-infarct 10/12/2012  . GERD (gastroesophageal reflux disease) 10/12/2012  . Psychosis ([place]) 10/12/2012  . Hypokalemia 10/12/2012  . Hyponatremia 10/12/2012    CMP     Component Value Date/Time   NA 139 10/14/2017   NA 139 10/14/2017   K 4.4 10/14/2017   K 4.4 10/14/2017   CL 98 08/28/2010 1845   CO2 31 08/28/2010 1845   GLUCOSE 89 08/28/2010 1845   BUN 19 10/14/2017   CREATININE 0.84 10/14/2017   CREATININE 0.8 10/14/2017   CALCIUM 9.1 10/14/2017   PROT 6 10/14/2017   ALBUMIN 4 10/14/2017   AST 19 10/14/2017   AST 19 10/14/2017   ALT 11 10/14/2017  ALT 11 10/14/2017   ALKPHOS 87 10/14/2017   ALKPHOS 87 10/14/2017   BILITOT 0.2 10/14/2017   GFRNONAA 54 (L) 08/28/2010 1845   GFRAA  08/28/2010 1845    >60        The eGFR has been calculated using the MDRD equation. This calculation has not been validated in all clinical situations. eGFR's persistently <60 mL/min signify possible Chronic Kidney Disease.   Recent Labs    02/10/17 10/14/17  NA 137 139  139  K 4.1 4.4  4.4  BUN 19 19  CREATININE 0.8 0.8  0.84  CALCIUM  --  9.1   Recent Labs    02/10/17 10/14/17  AST '20 19  19  ' ALT '11 11  11  ' ALKPHOS 75 87  87  BILITOT  --  0.2  PROT  --  6  ALBUMIN  --  4   Recent Labs    02/10/17 10/14/17  WBC 4.1 4.4  4.4  HGB 14.2 13.8  13.8  HCT 41 39  39.2  PLT 177 211   Recent Labs    02/10/17  CHOL 210*  LDLCALC 123  TRIG 49   No results found for: MICROALBUR Lab Results  Component Value Date   TSH 1.68 10/14/2017   TSH 1.68 10/14/2017    Lab Results  Component Value Date   HGBA1C 5.3 10/14/2017   Lab Results  Component Value Date   CHOL 210 (A) 02/10/2017   HDL 77 (A) 02/10/2017   LDLCALC 123 02/10/2017   TRIG 49 02/10/2017    Significant Diagnostic Results in last 30 days:  No results found.  Assessment and Plan  GERD (gastroesophageal reflux disease) No reports of reflux or discomfort; continue Pepcid 20 mg daily  History of pulmonary embolism Stable, chronic; continue Xarelto 20 mg daily  Long term current use of anticoagulant therapy Patient on Xarelto for chronic PE; there have been no problems; continue Xarelto 20 mg daily    Lem Peary D. [name], MD

## 2018-01-11 ENCOUNTER — Encounter: Payer: Self-pay | Admitting: Internal Medicine

## 2018-01-11 NOTE — Assessment & Plan Note (Signed)
No major decline; continue Namenda 10 mg twice daily

## 2018-01-11 NOTE — Assessment & Plan Note (Signed)
Controlled; continue Effexor 75 mg daily

## 2018-01-11 NOTE — Assessment & Plan Note (Signed)
On lisinopril 5 mg p.o. daily and controlled

## 2018-01-29 ENCOUNTER — Encounter: Payer: Self-pay | Admitting: Internal Medicine

## 2018-01-29 NOTE — Assessment & Plan Note (Signed)
Patient on Xarelto for chronic PE; there have been no problems; continue Xarelto 20 mg daily

## 2018-01-29 NOTE — Assessment & Plan Note (Signed)
No reports of reflux or discomfort; continue Pepcid 20 mg daily

## 2018-01-29 NOTE — Assessment & Plan Note (Signed)
Stable, chronic; continue Xarelto 20 mg daily

## 2018-01-30 ENCOUNTER — Non-Acute Institutional Stay (SKILLED_NURSING_FACILITY): Payer: Medicare Other | Admitting: Internal Medicine

## 2018-01-30 ENCOUNTER — Encounter: Payer: Self-pay | Admitting: Internal Medicine

## 2018-01-30 DIAGNOSIS — F324 Major depressive disorder, single episode, in partial remission: Secondary | ICD-10-CM

## 2018-01-30 DIAGNOSIS — E034 Atrophy of thyroid (acquired): Secondary | ICD-10-CM | POA: Diagnosis not present

## 2018-01-30 DIAGNOSIS — E785 Hyperlipidemia, unspecified: Secondary | ICD-10-CM | POA: Diagnosis not present

## 2018-01-30 NOTE — Progress Notes (Signed)
:    Location:  [facility] Room Number: [phone] Place of Service:  SNF (31)  Tricia Luellen D. [name], MD  Patient Care Team: [name], MD as PCP - General (Internal Medicine)  Extended Emergency Contact Information Primary Emergency Contact: Culclasure,[name]  [place] Phone: [phone] Relation: Other     Allergies: Ciprofloxacin hcl; Codeine; Duloxetine hcl; Hctz [hydrochlorothiazide]; Metaxalone; Morphine and related; Olmesartan; Penicillins; Quetiapine fumarate; and Zofran [ondansetron hcl]  Chief Complaint  Patient presents with  . Medical Management of Chronic Issues    Routine Visit    HPI: Patient is 84 y.o. female who is being seen for routine issues of hyperlipidemia, hypothyroidism, and depression.  Past Medical History:  Diagnosis Date  . Anxiety state 02/25/2013  . Chronic kidney disease, stage III (moderate) (HCC)   . CKD (chronic kidney disease) stage 3, GFR 30-59 ml/min (HCC) 04/14/2016  . Dementia, multi-infarct 10/12/2012  . Depression 10/12/2012  . Gastroesophageal reflux disease without esophagitis   . Generalized anxiety disorder   . GERD (gastroesophageal reflux disease) 10/12/2012  . History of falling   . History of pulmonary embolism 10/12/2012  . Hyperlipidemia   . Hypertension   . Hypo-osmolality and hyponatremia   . Hypokalemia   . Hypothyroid   . Long term current use of anticoagulant therapy 10/12/2012  . Major depressive disorder, single episode, unspecified   . OA (osteoarthritis)   . Old myocardial infarction   . Osteoarthritis 02/04/2013  . PE (pulmonary embolism)   . Peripheral vascular disease, unspecified ([place])   . Vascular dementia without behavioral disturbance     Past Surgical History:  Procedure Laterality Date  . JOINT REPLACEMENT     B knees    Allergies as of 01/30/2018      Reactions   Ciprofloxacin Hcl    Codeine    Duloxetine Hcl    Hctz [hydrochlorothiazide]      Metaxalone    Morphine And Related    Olmesartan    Penicillins    Quetiapine Fumarate    Zofran [ondansetron Hcl]       Medication List        Accurate as of 01/30/18 11:59 PM. Always use your most recent med list.          calcium-vitamin D 500-200 MG-UNIT tablet Commonly known as:  OSCAL WITH D Take 1 tablet by mouth daily with breakfast.   DAILY VITE Tabs Take 1 tablet by mouth daily.   DIALYVITE VITAMIN D3 MAX 31517 units Tabs Generic drug:  Cholecalciferol Take 1 tablet by mouth once a week. 12 weeks for Vit D.   docusate sodium 100 MG capsule Commonly known as:  COLACE Take 100 mg by mouth 2 (two) times daily. Hold for loose stool   famotidine 20 MG tablet Commonly known as:  PEPCID Take 20 mg by mouth daily. For reflux   fish oil-omega-3 fatty acids 1000 MG capsule Take 1 g by mouth 2 (two) times daily. For hyperlipidemia   levothyroxine 75 MCG tablet Commonly known as:  SYNTHROID, LEVOTHROID Take 75 mcg by mouth daily. For hypothyroidism   lisinopril 5 MG tablet Commonly known as:  PRINIVIL,ZESTRIL Take 5 mg by mouth daily.   memantine 10 MG tablet Commonly known as:  NAMENDA Take 10 mg by mouth 2 (two) times daily. For dementia   polyethylene glycol packet Commonly known as:  MIRALAX / GLYCOLAX Take 17 g by mouth daily. HOLD FOR LOOSE  STOOLS   rivaroxaban 20 MG Tabs tablet Commonly known as:  XARELTO Take 20 mg by mouth daily with supper.   sodium chloride 1 g tablet Take 1 g by mouth 3 (three) times daily with meals. For sodium supplement   TYLENOL 8 HOUR ARTHRITIS PAIN 650 MG CR tablet Generic drug:  acetaminophen Take 650 mg by mouth every 12 (twelve) hours. For arthritis   venlafaxine XR 75 MG 24 hr capsule Commonly known as:  EFFEXOR-XR Take 75 mg by mouth daily with breakfast.       No orders of the defined types were placed in this encounter.   Immunization History  Administered Date(s) Administered  .  Influenza-Unspecified 04/26/2013, 04/21/2015, 05/28/2016, 05/29/2017  . Pneumococcal Conjugate-13 07/06/2017    Social History   Tobacco Use  . Smoking status: Never Smoker  . Smokeless tobacco: Never Used  Substance Use Topics  . Alcohol use: No    Family history is   History reviewed. No pertinent family history.    Review of Systems  DATA OBTAINED: from patient, nurse, GENERAL:  no fevers, fatigue, appetite changes SKIN: No itching, or rash EYES: No eye pain, redness, discharge EARS: No earache, tinnitus, change in hearing NOSE: No congestion, drainage or bleeding  MOUTH/THROAT: No mouth or tooth pain, No sore throat RESPIRATORY: No cough, wheezing, SOB CARDIAC: No chest pain, palpitations, lower extremity edema  GI: No abdominal pain, No N/V/D or constipation, No heartburn or reflux  GU: No dysuria, frequency or urgency, or incontinence  MUSCULOSKELETAL: No unrelieved bone/joint pain NEUROLOGIC: No headache, dizziness or focal weakness PSYCHIATRIC: No c/o anxiety or sadness   Vitals:   01/30/18 1355  BP: (!) 124/57  Pulse: 72  Resp: (!) 21  Temp: (!) 97.4 F (36.3 C)  SpO2: 96%    SpO2 Readings from Last 1 Encounters:  01/30/18 96%   Body mass index is 34.54 kg/m.     Physical Exam  GENERAL APPEARANCE: Alert, conversant,  No acute distress.  SKIN: No diaphoresis rash HEAD: Normocephalic, atraumatic  EYES: Conjunctiva/lids clear. Pupils round, reactive. EOMs intact.  EARS: External exam WNL, canals clear. Hearing grossly normal.  NOSE: No deformity or discharge.  MOUTH/THROAT: Lips w/o lesions  RESPIRATORY: Breathing is even, unlabored. Lung sounds are clear   CARDIOVASCULAR: Heart RRR no murmurs, rubs or gallops. No peripheral edema.   GASTROINTESTINAL: Abdomen is soft, non-tender, not distended w/ normal bowel sounds. GENITOURINARY: Bladder non tender, not distended  MUSCULOSKELETAL: No abnormal joints or musculature NEUROLOGIC:  Cranial nerves  2-12 grossly intact. Moves all extremities  PSYCHIATRIC: Mood and affect appropriate to situation, no behavioral issues  Patient Active Problem List   Diagnosis Date Noted  . Major depressive disorder, single episode, unspecified 06/20/2017  . Peripheral vascular disease, unspecified ([place]) 04/05/2017  . Vascular dementia without behavioral disturbance 02/06/2017  . CKD (chronic kidney disease) stage 3, GFR 30-59 ml/min (HCC) 04/14/2016  . Hyperlipidemia 03/17/2016  . Fever 05/10/2014  . Nausea with vomiting 03/14/2014  . Mouth pain 11/17/2013  . Anxiety state 02/25/2013  . Lethargy 02/25/2013  . Osteoarthritis 02/04/2013  . Edema 02/04/2013  . Other convulsions 11/27/2012  . Unspecified constipation 11/27/2012  . History of pulmonary embolism 10/12/2012  . Long term current use of anticoagulant therapy 10/12/2012  . E. coli UTI 10/12/2012  . Essential hypertension, benign 10/12/2012  . Depression 10/12/2012  . Hypothyroidism 10/12/2012  . Dementia, multi-infarct 10/12/2012  . GERD (gastroesophageal reflux disease) 10/12/2012  . Psychosis ([place]) 10/12/2012  .  Hypokalemia 10/12/2012  . Hyponatremia 10/12/2012      Labs reviewed: Basic Metabolic Panel:    Component Value Date/Time   NA 139 10/14/2017   NA 139 10/14/2017   K 4.4 10/14/2017   K 4.4 10/14/2017   CL 98 08/28/2010 1845   CO2 31 08/28/2010 1845   GLUCOSE 89 08/28/2010 1845   BUN 19 10/14/2017   CREATININE 0.84 10/14/2017   CREATININE 0.8 10/14/2017   CALCIUM 9.1 10/14/2017   PROT 6 10/14/2017   ALBUMIN 4 10/14/2017   AST 19 10/14/2017   AST 19 10/14/2017   ALT 11 10/14/2017   ALT 11 10/14/2017   ALKPHOS 87 10/14/2017   ALKPHOS 87 10/14/2017   BILITOT 0.2 10/14/2017   GFRNONAA 54 (L) 08/28/2010 1845   GFRAA  08/28/2010 1845    >60        The eGFR has been calculated using the MDRD equation. This calculation has not been validated in all clinical situations. eGFR's persistently <60 mL/min  signify possible Chronic Kidney Disease.    Recent Labs    10/14/17  NA 139  139  K 4.4  4.4  BUN 19  CREATININE 0.8  0.84  CALCIUM 9.1   Liver Function Tests: Recent Labs    10/14/17  AST 19  19  ALT 11  11  ALKPHOS 87  87  BILITOT 0.2  PROT 6  ALBUMIN 4   No results for input(s): LIPASE, AMYLASE in the last 8760 hours. No results for input(s): AMMONIA in the last 8760 hours. CBC: Recent Labs    10/14/17  WBC 4.4  4.4  HGB 13.8  13.8  HCT 39  39.2  PLT 211   Lipid No results for input(s): CHOL, HDL, LDLCALC, TRIG in the last 8760 hours.  Cardiac Enzymes: No results for input(s): CKTOTAL, CKMB, CKMBINDEX, TROPONINI in the last 8760 hours. BNP: No results for input(s): BNP in the last 8760 hours. No results found for: [facility] Lab Results  Component Value Date   HGBA1C 5.3 10/14/2017   Lab Results  Component Value Date   TSH 1.68 10/14/2017   TSH 1.68 10/14/2017   No results found for: VITAMINB12 No results found for: FOLATE No results found for: IRON, TIBC, FERRITIN  Imaging and Procedures obtained prior to SNF admission: US Abdomen Complete  Result Date: 08/28/2010 *RADIOLOGY REPORT* Clinical Data:  The ABDOMINAL ULTRASOUND COMPLETE Comparison:  None. Findings: Gallbladder:  No gallstones, gallbladder wall thickening, or pericholecystic fluid. Common Bile Duct:  Within normal limits in caliber.1.2 mm. Liver: No focal mass lesion identified.  Within normal limits in parenchymal echogenicity. IVC:  Limited visualization due to bowel gas. Pancreas:  Limited visualization due to bowel gas.Visualized portion shows no abnormality. Spleen:  Within normal limits in size and echotexture. Right kidney:  Normal in size and parenchymal echogenicity.  No evidence of mass or hydronephrosis. Left kidney:  Normal in size and parenchymal echogenicity.  No evidence of mass or hydronephrosis. Abdominal Aorta:  No aneurysm identified. No ascites is identified.  IMPRESSION: Negative abdominal ultrasound. Evaluation of pancreas and inferior vena cava is limited by bowel gas. Original Report Authenticated By: Curlene Dolphin, M.D.    Not all labs, radiology exams or other studies done during hospitalization come through on my EPIC note; however they are reviewed by me.    Assessment and Plan  Hyperlipidemia LDL 123, HDL 77 which is very protective; for age would continue omega-3 fatty acid 1000 units twice daily  Hypothyroidism TSH  1.88, controlled on 75 mcg daily replacement  Major depressive disorder, single episode, unspecified Appears very stable; patient is out of room socializing daily; continue Effexor 75 mg daily   Dajahnae Vondra D. [name], MD

## 2018-02-13 ENCOUNTER — Non-Acute Institutional Stay (SKILLED_NURSING_FACILITY): Payer: Medicare Other

## 2018-02-13 DIAGNOSIS — Z0000 Encounter for general adult medical examination without abnormal findings: Secondary | ICD-10-CM

## 2018-02-13 NOTE — Patient Instructions (Signed)
[name] , Thank you for taking time to come for your Medicare Wellness Visit. I appreciate your ongoing commitment to your health goals. Please review the following plan we discussed and let me know if I can assist you in the future.   Screening recommendations/referrals: Colonoscopy excluded, over age 82 Mammogram excluded, over age 82 Bone Density up to date Recommended yearly ophthalmology/optometry visit for glaucoma screening and checkup Recommended yearly dental visit for hygiene and checkup  Vaccinations: Influenza vaccine up to date, due 2019 fall season Pneumococcal vaccine up to date, completed Tdap vaccine due, ordered Shingles vaccine not in past records    Advanced directives: in chart  Conditions/risks identified: none  Next appointment: Dr. Lyn Martin makes rounds   Preventive Care 65 Years and Older, Female Preventive care refers to lifestyle choices and visits with your health care provider that can promote health and wellness. What does preventive care include?  A yearly physical exam. This is also called an annual well check.  Dental exams once or twice a year.  Routine eye exams. Ask your health care provider how often you should have your eyes checked.  Personal lifestyle choices, including:  Daily care of your teeth and gums.  Regular physical activity.  Eating a healthy diet.  Avoiding tobacco and drug use.  Limiting alcohol use.  Practicing safe sex.  Taking low-dose aspirin every day.  Taking vitamin and mineral supplements as recommended by your health care provider. What happens during an annual well check? The services and screenings done by your health care provider during your annual well check will depend on your age, overall health, lifestyle risk factors, and family history of disease. Counseling  Your health care provider may ask you questions about your:  Alcohol use.  Tobacco use.  Drug use.  Emotional  well-being.  Home and relationship well-being.  Sexual activity.  Eating habits.  History of falls.  Memory and ability to understand (cognition).  Work and work Astronomerenvironment.  Reproductive health. Screening  You may have the following tests or measurements:  Height, weight, and BMI.  Blood pressure.  Lipid and cholesterol levels. These may be checked every 5 years, or more frequently if you are over 82 years old.  Skin check.  Lung cancer screening. You may have this screening every year starting at age 82 if you have a 30-pack-year history of smoking and currently smoke or have quit within the past 15 years.  Fecal occult blood test (FOBT) of the stool. You may have this test every year starting at age 82.  Flexible sigmoidoscopy or colonoscopy. You may have a sigmoidoscopy every 5 years or a colonoscopy every 10 years starting at age 82.  Hepatitis C blood test.  Hepatitis B blood test.  Sexually transmitted disease (STD) testing.  Diabetes screening. This is done by checking your blood sugar (glucose) after you have not eaten for a while (fasting). You may have this done every 1-3 years.  Bone density scan. This is done to screen for osteoporosis. You may have this done starting at age 82.  Mammogram. This may be done every 1-2 years. Talk to your health care provider about how often you should have regular mammograms. Talk with your health care provider about your test results, treatment options, and if necessary, the need for more tests. Vaccines  Your health care provider may recommend certain vaccines, such as:  Influenza vaccine. This is recommended every year.  Tetanus, diphtheria, and acellular pertussis (Tdap, Td) vaccine. You  may need a Td booster every 10 years.  Zoster vaccine. You may need this after age 27.  Pneumococcal 13-valent conjugate (PCV13) vaccine. One dose is recommended after age 30.  Pneumococcal polysaccharide (PPSV23) vaccine. One  dose is recommended after age 65. Talk to your health care provider about which screenings and vaccines you need and how often you need them. This information is not intended to replace advice given to you by your health care provider. Make sure you discuss any questions you have with your health care provider. Document Released: 08/04/2015 Document Revised: 03/27/2016 Document Reviewed: 05/09/2015 Elsevier Interactive Patient Education  2017 [place] Prevention in the Home Falls can cause injuries. They can happen to people of all ages. There are many things you can do to make your home safe and to help prevent falls. What can I do on the outside of my home?  Regularly fix the edges of walkways and driveways and fix any cracks.  Remove anything that might make you trip as you walk through a door, such as a raised step or threshold.  Trim any bushes or trees on the path to your home.  Use bright outdoor lighting.  Clear any walking paths of anything that might make someone trip, such as rocks or tools.  Regularly check to see if handrails are loose or broken. Make sure that both sides of any steps have handrails.  Any raised decks and porches should have guardrails on the edges.  Have any leaves, snow, or ice cleared regularly.  Use sand or salt on walking paths during winter.  Clean up any spills in your garage right away. This includes oil or grease spills. What can I do in the bathroom?  Use night lights.  Install grab bars by the toilet and in the tub and shower. Do not use towel bars as grab bars.  Use non-skid mats or decals in the tub or shower.  If you need to sit down in the shower, use a plastic, non-slip stool.  Keep the floor dry. Clean up any water that spills on the floor as soon as it happens.  Remove soap buildup in the tub or shower regularly.  Attach bath mats securely with double-sided non-slip rug tape.  Do not have throw rugs and other  things on the floor that can make you trip. What can I do in the bedroom?  Use night lights.  Make sure that you have a light by your bed that is easy to reach.  Do not use any sheets or blankets that are too big for your bed. They should not hang down onto the floor.  Have a firm chair that has side arms. You can use this for support while you get dressed.  Do not have throw rugs and other things on the floor that can make you trip. What can I do in the kitchen?  Clean up any spills right away.  Avoid walking on wet floors.  Keep items that you use a lot in easy-to-reach places.  If you need to reach something above you, use a strong step stool that has a grab bar.  Keep electrical cords out of the way.  Do not use floor polish or wax that makes floors slippery. If you must use wax, use non-skid floor wax.  Do not have throw rugs and other things on the floor that can make you trip. What can I do with my stairs?  Do not leave any items  on the stairs.  Make sure that there are handrails on both sides of the stairs and use them. Fix handrails that are broken or loose. Make sure that handrails are as long as the stairways.  Check any carpeting to make sure that it is firmly attached to the stairs. Fix any carpet that is loose or worn.  Avoid having throw rugs at the top or bottom of the stairs. If you do have throw rugs, attach them to the floor with carpet tape.  Make sure that you have a light switch at the top of the stairs and the bottom of the stairs. If you do not have them, ask someone to add them for you. What else can I do to help prevent falls?  Wear shoes that:  Do not have high heels.  Have rubber bottoms.  Are comfortable and fit you well.  Are closed at the toe. Do not wear sandals.  If you use a stepladder:  Make sure that it is fully opened. Do not climb a closed stepladder.  Make sure that both sides of the stepladder are locked into place.  Ask  someone to hold it for you, if possible.  Clearly mark and make sure that you can see:  Any grab bars or handrails.  First and last steps.  Where the edge of each step is.  Use tools that help you move around (mobility aids) if they are needed. These include:  Canes.  Walkers.  Scooters.  Crutches.  Turn on the lights when you go into a dark area. Replace any light bulbs as soon as they burn out.  Set up your furniture so you have a clear path. Avoid moving your furniture around.  If any of your floors are uneven, fix them.  If there are any pets around you, be aware of where they are.  Review your medicines with your doctor. Some medicines can make you feel dizzy. This can increase your chance of falling. Ask your doctor what other things that you can do to help prevent falls. This information is not intended to replace advice given to you by your health care provider. Make sure you discuss any questions you have with your health care provider. Document Released: 05/04/2009 Document Revised: 12/14/2015 Document Reviewed: 08/12/2014 Elsevier Interactive Patient Education  2017 [employer].

## 2018-02-13 NOTE — Progress Notes (Signed)
Subjective:   Tricia Martin is a 84 y.o. female who presents for Medicare Annual (Subsequent) preventive examination at [facility]  Last AWV-02/12/2017    Objective:     Vitals: BP 125/70 (BP Location: Right Arm, Patient Position: Supine)   Pulse 73   Temp (!) 97.5 F (36.4 C) (Oral)   Ht 5\' 6"  (1.676 m)   Wt 214 lb (97.1 kg)   BMI 34.54 kg/m   Body mass index is 34.54 kg/m.  Advanced Directives 02/13/2018 01/05/2018 10/10/2017 09/17/2017 08/19/2017 07/18/2017 06/18/2017  Does Patient Have a Medical Advance Directive? No No No No No No No  Does patient want to make changes to medical advance directive? - - - - - - -  Copy of Healthcare Power of Attorney in Chart? - - - - - - -  Would patient like information on creating a medical advance directive? No - Patient declined No - Patient declined - - - - -    Tobacco Social History   Tobacco Use  Smoking Status Never Smoker  Smokeless Tobacco Never Used     Counseling given: Not Answered   Clinical Intake:  Pre-visit preparation completed: No  Pain : No/denies pain     Nutritional Risks: None Diabetes: No  How often do you need to have someone help you when you read instructions, pamphlets, or other written materials from your doctor or pharmacy?: 1 - Never What is the last grade level you completed in school?: High school  Interpreter Needed?: No  Information entered by :: Tyron Russell, RN  Past Medical History:  Diagnosis Date  . Anxiety state 02/25/2013  . Chronic kidney disease, stage III (moderate) (HCC)   . CKD (chronic kidney disease) stage 3, GFR 30-59 ml/min (HCC) 04/14/2016  . Dementia, multi-infarct 10/12/2012  . Depression 10/12/2012  . Gastroesophageal reflux disease without esophagitis   . Generalized anxiety disorder   . GERD (gastroesophageal reflux disease) 10/12/2012  . History of falling   . History of pulmonary embolism 10/12/2012  . Hyperlipidemia   . Hypertension   .  Hypo-osmolality and hyponatremia   . Hypokalemia   . Hypothyroid   . Long term current use of anticoagulant therapy 10/12/2012  . Major depressive disorder, single episode, unspecified   . OA (osteoarthritis)   . Old myocardial infarction   . Osteoarthritis 02/04/2013  . PE (pulmonary embolism)   . Peripheral vascular disease, unspecified (HCC)   . Vascular dementia without behavioral disturbance    Past Surgical History:  Procedure Laterality Date  . JOINT REPLACEMENT     B knees   History reviewed. No pertinent family history. Social History   Socioeconomic History  . Marital status: Single    Spouse name: Not on file  . Number of children: Not on file  . Years of education: Not on file  . Highest education level: Not on file  Occupational History  . Occupation: retired Engineer, mining  . Financial resource strain: Not hard at all  . Food insecurity:    Worry: Never true    Inability: Never true  . Transportation needs:    Medical: No    Non-medical: No  Tobacco Use  . Smoking status: Never Smoker  . Smokeless tobacco: Never Used  Substance and Sexual Activity  . Alcohol use: No  . Drug use: No  . Sexual activity: Never  Lifestyle  . Physical activity:    Days per week: 0 days  Minutes per session: 0 min  . Stress: Only a little  Relationships  . Social connections:    Talks on phone: Once a week    Gets together: Once a week    Attends religious service: Never    Active member of club or organization: No    Attends meetings of clubs or organizations: Never    Relationship status: Never married  Other Topics Concern  . Not on file  Social History Narrative   [facility]   Divorced   Never smoked   Alcohol none   Full Code    Outpatient Encounter Medications as of 02/13/2018  Medication Sig  . acetaminophen (TYLENOL 8 HOUR ARTHRITIS PAIN) 650 MG CR tablet Take 650 mg by mouth every 12 (twelve) hours. For arthritis   .  calcium-vitamin D (OSCAL WITH D) 500-200 MG-UNIT tablet Take 1 tablet by mouth daily with breakfast.   . Cholecalciferol (DIALYVITE VITAMIN D3 MAX) 1610950000 units TABS Take 1 tablet by mouth once a week. 12 weeks for Vit D.  . docusate sodium (COLACE) 100 MG capsule Take 100 mg by mouth 2 (two) times daily. Hold for loose stool  . famotidine (PEPCID) 20 MG tablet Take 20 mg by mouth daily. For reflux  . fish oil-omega-3 fatty acids 1000 MG capsule Take 1 g by mouth 2 (two) times daily. For hyperlipidemia  . levothyroxine (SYNTHROID, LEVOTHROID) 75 MCG tablet Take 75 mcg by mouth daily. For hypothyroidism  . lisinopril (PRINIVIL,ZESTRIL) 5 MG tablet Take 5 mg by mouth daily.  . memantine (NAMENDA) 10 MG tablet Take 10 mg by mouth 2 (two) times daily. For dementia  . Multiple Vitamin (DAILY VITE) TABS Take 1 tablet by mouth daily.  . polyethylene glycol (MIRALAX / GLYCOLAX) packet Take 17 g by mouth daily. HOLD FOR LOOSE STOOLS  . rivaroxaban (XARELTO) 20 MG TABS tablet Take 20 mg by mouth daily with supper.   . sodium chloride 1 G tablet Take 1 g by mouth 3 (three) times daily with meals. For sodium supplement  . venlafaxine XR (EFFEXOR-XR) 75 MG 24 hr capsule Take 75 mg by mouth daily with breakfast.   No facility-administered encounter medications on file as of 02/13/2018.     Activities of Daily Living In your present state of health, do you have any difficulty performing the following activities: 02/13/2018  Hearing? N  Vision? N  Difficulty concentrating or making decisions? Y  Walking or climbing stairs? Y  Dressing or bathing? Y  Doing errands, shopping? Y  Preparing Food and eating ? Y  Using the Toilet? Y  In the past six months, have you accidently leaked urine? Y  Do you have problems with loss of bowel control? N  Managing your Medications? Y  Managing your Finances? Y  Housekeeping or managing your Housekeeping? Y  Some recent data might be hidden    Patient Care  Team: Margit HanksAlexander, Anne D, MD as PCP - General (Internal Medicine)    Assessment:   This is a routine wellness examination for Tricia Martin.  Exercise Activities and Dietary recommendations Current Exercise Habits: The patient does not participate in regular exercise at present  Goals    None      Fall Risk Fall Risk  02/13/2018 02/12/2017  Falls in the past year? No No   Is the patient's home free of loose throw rugs in walkways, pet beds, electrical cords, etc?   yes      Grab bars in  the bathroom? yes      Handrails on the stairs?   yes      Adequate lighting?   yes  Depression Screen PHQ 2/9 Scores 02/13/2018 02/12/2017  PHQ - 2 Score 0 0     Cognitive Function     6CIT Screen 02/13/2018 02/12/2017  What Year? 4 points 4 points  What month? 3 points 3 points  What time? 3 points 3 points  Count back from 20 0 points 0 points  Months in reverse 0 points 0 points  Repeat phrase 0 points 0 points  Total Score 10 10    Immunization History  Administered Date(s) Administered  . Influenza-Unspecified 04/26/2013, 04/21/2015, 05/28/2016, 05/29/2017  . Pneumococcal Conjugate-13 07/06/2017    Qualifies for Shingles Vaccine? Not in past records  Screening Tests Health Maintenance  Topic Date Due  . TETANUS/TDAP  12/13/2025 (Originally 11/03/1949)  . INFLUENZA VACCINE  02/19/2018  . PNA vac Low Risk Adult (2 of 2 - PPSV23) 07/06/2018  . DEXA SCAN  Completed    Cancer Screenings: Lung: Low Dose CT Chest recommended if Age 38-80 years, 30 pack-year currently smoking OR have quit w/in 15years. Patient does not qualify. Breast:  Up to date on Mammogram? Yes   Up to date of Bone Density/Dexa? Yes Colorectal: up to date  Additional Screenings:  Hepatitis C Screening: declined TDAP due: ordered    Plan:    I have personally reviewed and addressed the Medicare Annual Wellness questionnaire and have noted the following in the patient's chart:  A. Medical and social  history B. Use of alcohol, tobacco or illicit drugs  C. Current medications and supplements D. Functional ability and status E.  Nutritional status F.  Physical activity G. Advance directives H. List of other physicians I.  Hospitalizations, surgeries, and ER visits in previous 12 months J.  Vitals K. Screenings to include hearing, vision, cognitive, depression L. Referrals and appointments - none  In addition, I have reviewed and discussed with patient certain preventive protocols, quality metrics, and best practice recommendations. A written personalized care plan for preventive services as well as general preventive health recommendations were provided to patient.  See attached scanned questionnaire for additional information.   Signed,   Tyron Russell, RN Nurse Health Advisor  Patient Concerns: None

## 2018-02-26 ENCOUNTER — Encounter: Payer: Self-pay | Admitting: Internal Medicine

## 2018-02-26 NOTE — Assessment & Plan Note (Signed)
TSH 1.88, controlled on 75 mcg daily replacement

## 2018-02-26 NOTE — Assessment & Plan Note (Signed)
LDL 123, HDL 77 which is very protective; for age would continue omega-3 fatty acid 1000 units twice daily

## 2018-02-26 NOTE — Assessment & Plan Note (Signed)
Appears very stable; patient is out of room socializing daily; continue Effexor 75 mg daily

## 2018-03-10 ENCOUNTER — Non-Acute Institutional Stay (SKILLED_NURSING_FACILITY): Payer: Medicare Other | Admitting: Internal Medicine

## 2018-03-10 ENCOUNTER — Encounter: Payer: Self-pay | Admitting: Internal Medicine

## 2018-03-10 DIAGNOSIS — E871 Hypo-osmolality and hyponatremia: Secondary | ICD-10-CM | POA: Diagnosis not present

## 2018-03-10 DIAGNOSIS — F0150 Vascular dementia without behavioral disturbance: Secondary | ICD-10-CM

## 2018-03-10 DIAGNOSIS — I739 Peripheral vascular disease, unspecified: Secondary | ICD-10-CM

## 2018-03-10 NOTE — Progress Notes (Signed)
Location:  [facility] Room Number: [phone] Place of Service:  SNF [phone])  Tricia Martin. Tricia Coil, MD  Patient Care Team: [name], MD as PCP - General (Internal Medicine)  Extended Emergency Contact Information Primary Emergency Contact: Culclasure,[name]  [place] Phone: [phone] Relation: Other    Allergies: Ciprofloxacin hcl; Codeine; Duloxetine hcl; Hctz [hydrochlorothiazide]; Metaxalone; Morphine and related; Olmesartan; Penicillins; Quetiapine fumarate; and Zofran [ondansetron hcl]  Chief Complaint  Patient presents with  . Medical Management of Chronic Issues    Routine Visit    HPI: Patient is 84 y.o. female who is being seen for routine issues of peripheral vascular disease, dementia, and hyponatremia.  Past Medical History:  Diagnosis Date  . Anxiety state 02/25/2013  . Chronic kidney disease, stage III (moderate) (HCC)   . CKD (chronic kidney disease) stage 3, GFR 30-59 ml/min (HCC) 04/14/2016  . Dementia, multi-infarct 10/12/2012  . Depression 10/12/2012  . Gastroesophageal reflux disease without esophagitis   . Generalized anxiety disorder   . GERD (gastroesophageal reflux disease) 10/12/2012  . History of falling   . History of pulmonary embolism 10/12/2012  . Hyperlipidemia   . Hypertension   . Hypo-osmolality and hyponatremia   . Hypokalemia   . Hypothyroid   . Long term current use of anticoagulant therapy 10/12/2012  . Major depressive disorder, single episode, unspecified   . OA (osteoarthritis)   . Old myocardial infarction   . Osteoarthritis 02/04/2013  . PE (pulmonary embolism)   . Peripheral vascular disease, unspecified ([place])   . Vascular dementia without behavioral disturbance     Past Surgical History:  Procedure Laterality Date  . JOINT REPLACEMENT     B knees    Allergies as of 03/10/2018      Reactions   Ciprofloxacin Hcl    Codeine    Duloxetine Hcl    Hctz  [hydrochlorothiazide]    Metaxalone    Morphine And Related    Olmesartan    Penicillins    Quetiapine Fumarate    Zofran [ondansetron Hcl]       Medication List        Accurate as of 03/10/18 11:59 PM. Always use your most recent med list.          calcium-vitamin D 500-200 MG-UNIT tablet Commonly known as:  OSCAL WITH D Take 1 tablet by mouth daily with breakfast.   DAILY VITE Tabs Take 1 tablet by mouth daily.   DIALYVITE VITAMIN D3 MAX 27782 units Tabs Generic drug:  Cholecalciferol Take 1 tablet by mouth once a week. 12 weeks for Vit D.   docusate sodium 100 MG capsule Commonly known as:  COLACE Take 100 mg by mouth 2 (two) times daily. Hold for loose stool   famotidine 20 MG tablet Commonly known as:  PEPCID Take 20 mg by mouth daily. For reflux   fish oil-omega-3 fatty acids 1000 MG capsule Take 1 g by mouth 2 (two) times daily. For hyperlipidemia   levothyroxine 75 MCG tablet Commonly known as:  SYNTHROID, LEVOTHROID Take 75 mcg by mouth daily. For hypothyroidism   lisinopril 5 MG tablet Commonly known as:  PRINIVIL,ZESTRIL Take 5 mg by mouth daily.   memantine 10 MG tablet Commonly known as:  NAMENDA Take 10 mg by mouth 2 (two) times daily. For dementia   polyethylene glycol packet Commonly known as:  MIRALAX / GLYCOLAX Take 17 g by mouth daily. HOLD FOR LOOSE STOOLS  rivaroxaban 20 MG Tabs tablet Commonly known as:  XARELTO Take 20 mg by mouth daily with supper.   sodium chloride 1 g tablet Take 1 g by mouth 3 (three) times daily with meals. For sodium supplement   TYLENOL 8 HOUR ARTHRITIS PAIN 650 MG CR tablet Generic drug:  acetaminophen Take 650 mg by mouth every 12 (twelve) hours. For arthritis   venlafaxine XR 75 MG 24 hr capsule Commonly known as:  EFFEXOR-XR Take 75 mg by mouth daily with breakfast.       No orders of the defined types were placed in this encounter.   Immunization History  Administered Date(s) Administered    . Influenza-Unspecified 04/26/2013, 04/21/2015, 05/28/2016, 05/29/2017  . Pneumococcal Conjugate-13 07/06/2017    Social History   Tobacco Use  . Smoking status: Never Smoker  . Smokeless tobacco: Never Used  Substance Use Topics  . Alcohol use: No    Review of Systems  DATA OBTAINED: from patient, nurse GENERAL:  no fevers, fatigue, appetite changes SKIN: No itching, rash HEENT: No complaint RESPIRATORY: No cough, wheezing, SOB CARDIAC: No chest pain, palpitations, lower extremity edema  GI: No abdominal pain, No N/V/D or constipation, No heartburn or reflux  GU: No dysuria, frequency or urgency, or incontinence  MUSCULOSKELETAL: No unrelieved bone/joint pain NEUROLOGIC: No headache, dizziness  PSYCHIATRIC: No overt anxiety or sadness  Vitals:   03/10/18 1551  BP: 116/69  Pulse: 68  Resp: 18  Temp: 98.2 F (36.8 C)   Body mass index is 34.54 kg/m. Physical Exam  GENERAL APPEARANCE: Alert, conversant, No acute distress  SKIN: No diaphoresis rash HEENT: Unremarkable RESPIRATORY: Breathing is even, unlabored. Lung sounds are clear   CARDIOVASCULAR: Heart RRR no murmurs, rubs or gallops. No peripheral edema  GASTROINTESTINAL: Abdomen is soft, non-tender, not distended w/ normal bowel sounds.  GENITOURINARY: Bladder non tender, not distended  MUSCULOSKELETAL: No abnormal joints or musculature NEUROLOGIC: Cranial nerves 2-12 grossly intact. Moves all extremities PSYCHIATRIC: Mood and affect appropriate to situation, no behavioral issues  Patient Active Problem List   Diagnosis Date Noted  . Major depressive disorder, single episode, unspecified 06/20/2017  . Peripheral vascular disease, unspecified ([place]) 04/05/2017  . Vascular dementia without behavioral disturbance 02/06/2017  . CKD (chronic kidney disease) stage 3, GFR 30-59 ml/min (HCC) 04/14/2016  . Hyperlipidemia 03/17/2016  . Fever 05/10/2014  . Nausea with vomiting 03/14/2014  . Mouth pain 11/17/2013  .  Anxiety state 02/25/2013  . Lethargy 02/25/2013  . Osteoarthritis 02/04/2013  . Edema 02/04/2013  . Other convulsions 11/27/2012  . Unspecified constipation 11/27/2012  . History of pulmonary embolism 10/12/2012  . Long term current use of anticoagulant therapy 10/12/2012  . E. coli UTI 10/12/2012  . Essential hypertension, benign 10/12/2012  . Depression 10/12/2012  . Hypothyroidism 10/12/2012  . Dementia, multi-infarct 10/12/2012  . GERD (gastroesophageal reflux disease) 10/12/2012  . Psychosis ([place]) 10/12/2012  . Hypokalemia 10/12/2012  . Hyponatremia 10/12/2012    CMP     Component Value Date/Time   NA 139 10/14/2017   NA 139 10/14/2017   K 4.4 10/14/2017   K 4.4 10/14/2017   CL 98 08/28/2010 1845   CO2 31 08/28/2010 1845   GLUCOSE 89 08/28/2010 1845   BUN 19 10/14/2017   CREATININE 0.84 10/14/2017   CREATININE 0.8 10/14/2017   CALCIUM 9.1 10/14/2017   PROT 6 10/14/2017   ALBUMIN 4 10/14/2017   AST 19 10/14/2017   AST 19 10/14/2017   ALT 11 10/14/2017  ALT 11 10/14/2017   ALKPHOS 87 10/14/2017   ALKPHOS 87 10/14/2017   BILITOT 0.2 10/14/2017   GFRNONAA 54 (L) 08/28/2010 1845   GFRAA  08/28/2010 1845    >60        The eGFR has been calculated using the MDRD equation. This calculation has not been validated in all clinical situations. eGFR's persistently <60 mL/min signify possible Chronic Kidney Disease.   Recent Labs    10/14/17  NA 139  139  K 4.4  4.4  BUN 19  CREATININE 0.8  0.84  CALCIUM 9.1   Recent Labs    10/14/17  AST 19  19  ALT 11  11  ALKPHOS 87  87  BILITOT 0.2  PROT 6  ALBUMIN 4   Recent Labs    10/14/17  WBC 4.4  4.4  HGB 13.8  13.8  HCT 39  39.2  PLT 211   No results for input(s): CHOL, LDLCALC, TRIG in the last 8760 hours.  Invalid input(s): HCL No results found for: [facility] Lab Results  Component Value Date   TSH 1.68 10/14/2017   TSH 1.68 10/14/2017   Lab Results  Component Value Date   HGBA1C  5.3 10/14/2017   Lab Results  Component Value Date   CHOL 210 (A) 02/10/2017   HDL 77 (A) 02/10/2017   LDLCALC 123 02/10/2017   TRIG 49 02/10/2017    Significant Diagnostic Results in last 30 days:  No results found.  Assessment and Plan  Peripheral vascular disease, unspecified ([place]) No reported wounds or skin problems; continue Xarelto 20 mg daily  Vascular dementia without behavioral disturbance Stable, mild, continue Namenda 10 mg twice daily  Hyponatremia Most recent sodium 139 on sodium bicarb 1 g 3 times daily; continue current therapy    Tricia Martin D. Tricia Coil, MD

## 2018-03-14 ENCOUNTER — Encounter: Payer: Self-pay | Admitting: Internal Medicine

## 2018-03-14 NOTE — Assessment & Plan Note (Signed)
No reported wounds or skin problems; continue Xarelto 20 mg daily 

## 2018-03-14 NOTE — Assessment & Plan Note (Signed)
Most recent sodium 139 on sodium bicarb 1 g 3 times daily; continue current therapy

## 2018-03-14 NOTE — Assessment & Plan Note (Signed)
Stable, mild, continue Namenda 10 mg twice daily

## 2018-04-06 ENCOUNTER — Encounter: Payer: Self-pay | Admitting: Internal Medicine

## 2018-04-06 ENCOUNTER — Non-Acute Institutional Stay (SKILLED_NURSING_FACILITY): Payer: Medicare Other | Admitting: Internal Medicine

## 2018-04-06 DIAGNOSIS — F0150 Vascular dementia without behavioral disturbance: Secondary | ICD-10-CM

## 2018-04-06 DIAGNOSIS — F329 Major depressive disorder, single episode, unspecified: Secondary | ICD-10-CM | POA: Diagnosis not present

## 2018-04-06 DIAGNOSIS — I10 Essential (primary) hypertension: Secondary | ICD-10-CM

## 2018-04-06 DIAGNOSIS — F32A Depression, unspecified: Secondary | ICD-10-CM

## 2018-04-06 NOTE — Progress Notes (Signed)
Location:  [facility] Room Number: [phone] Place of Service:  SNF [phone])  Tricia Martin. Tricia Coil, MD  Patient Care Team: [name], MD as PCP - General (Internal Medicine)  Extended Emergency Contact Information Primary Emergency Contact: Culclasure,[name]  [place] Phone: [phone] Relation: Other    Allergies: Ciprofloxacin hcl; Codeine; Duloxetine hcl; Hctz [hydrochlorothiazide]; Metaxalone; Morphine and related; Olmesartan; Penicillins; Quetiapine fumarate; and Zofran [ondansetron hcl]  Chief Complaint  Patient presents with  . Medical Management of Chronic Issues    Routine Visit    HPI: Patient is 84 y.o. female who is being seen for routine issues of hypertension, depression, and dementia.  Past Medical History:  Diagnosis Date  . Anxiety state 02/25/2013  . Chronic kidney disease, stage III (moderate) (HCC)   . CKD (chronic kidney disease) stage 3, GFR 30-59 ml/min (HCC) 04/14/2016  . Dementia, multi-infarct 10/12/2012  . Depression 10/12/2012  . Gastroesophageal reflux disease without esophagitis   . Generalized anxiety disorder   . GERD (gastroesophageal reflux disease) 10/12/2012  . History of falling   . History of pulmonary embolism 10/12/2012  . Hyperlipidemia   . Hypertension   . Hypo-osmolality and hyponatremia   . Hypokalemia   . Hypothyroid   . Long term current use of anticoagulant therapy 10/12/2012  . Major depressive disorder, single episode, unspecified   . OA (osteoarthritis)   . Old myocardial infarction   . Osteoarthritis 02/04/2013  . PE (pulmonary embolism)   . Peripheral vascular disease, unspecified ([place])   . Vascular dementia without behavioral disturbance     Past Surgical History:  Procedure Laterality Date  . JOINT REPLACEMENT     B knees    Allergies as of 04/06/2018      Reactions   Ciprofloxacin Hcl    Codeine    Duloxetine Hcl    Hctz [hydrochlorothiazide]    Metaxalone     Morphine And Related    Olmesartan    Penicillins    Quetiapine Fumarate    Zofran [ondansetron Hcl]       Medication List        Accurate as of 04/06/18 11:59 PM. Always use your most recent med list.          calcium-vitamin Martin 500-200 MG-UNIT tablet Commonly known as:  OSCAL WITH Martin Take 1 tablet by mouth daily with breakfast.   DAILY VITE Tabs Take 1 tablet by mouth daily.   DIALYVITE VITAMIN D3 MAX 00762 units Tabs Generic drug:  Cholecalciferol Take 1 tablet by mouth once a week. 12 weeks for Vit Martin.   docusate sodium 100 MG capsule Commonly known as:  COLACE Take 100 mg by mouth 2 (two) times daily. Hold for loose stool   famotidine 20 MG tablet Commonly known as:  PEPCID Take 20 mg by mouth daily. For reflux   fish oil-omega-3 fatty acids 1000 MG capsule Take 1 g by mouth 2 (two) times daily. For hyperlipidemia   levothyroxine 75 MCG tablet Commonly known as:  SYNTHROID, LEVOTHROID Take 75 mcg by mouth daily. For hypothyroidism   lisinopril 5 MG tablet Commonly known as:  PRINIVIL,ZESTRIL Take 5 mg by mouth daily.   memantine 10 MG tablet Commonly known as:  NAMENDA Take 10 mg by mouth 2 (two) times daily. For dementia   polyethylene glycol packet Commonly known as:  MIRALAX / GLYCOLAX Take 17 g by mouth daily. HOLD FOR LOOSE STOOLS   rivaroxaban 20  MG Tabs tablet Commonly known as:  XARELTO Take 20 mg by mouth daily with supper.   sodium chloride 1 g tablet Take 1 g by mouth 3 (three) times daily with meals. For sodium supplement   TYLENOL 8 HOUR ARTHRITIS PAIN 650 MG CR tablet Generic drug:  acetaminophen Take 650 mg by mouth every 12 (twelve) hours. For arthritis   venlafaxine XR 75 MG 24 hr capsule Commonly known as:  EFFEXOR-XR Take 75 mg by mouth daily with breakfast.       No orders of the defined types were placed in this encounter.   Immunization History  Administered Date(s) Administered  . Influenza-Unspecified 04/26/2013,  04/21/2015, 05/28/2016, 05/29/2017  . Pneumococcal Conjugate-13 07/06/2017    Social History   Tobacco Use  . Smoking status: Never Smoker  . Smokeless tobacco: Never Used  Substance Use Topics  . Alcohol use: No    Review of Systems  DATA OBTAINED: from patient, nurse GENERAL:  no fevers, fatigue, appetite changes SKIN: No itching, rash HEENT: No complaint RESPIRATORY: No cough, wheezing, SOB CARDIAC: No chest pain, palpitations, lower extremity edema  GI: No abdominal pain, No N/V/Martin or constipation, No heartburn or reflux  GU: No dysuria, frequency or urgency, or incontinence  MUSCULOSKELETAL: No unrelieved bone/joint pain NEUROLOGIC: No headache, dizziness  PSYCHIATRIC: No overt anxiety or sadness  Vitals:   04/06/18 1413  BP: 103/65  Pulse: 90  Resp: 20  Temp: 97.9 F (36.6 C)   Body mass index is 34.48 kg/m. Physical Exam  GENERAL APPEARANCE: Alert, conversant, No acute distress  SKIN: No diaphoresis rash HEENT: Unremarkable RESPIRATORY: Breathing is even, unlabored. Lung sounds are clear   CARDIOVASCULAR: Heart RRR no murmurs, rubs or gallops. No peripheral edema  GASTROINTESTINAL: Abdomen is soft, non-tender, not distended w/ normal bowel sounds.  GENITOURINARY: Bladder non tender, not distended  MUSCULOSKELETAL: No abnormal joints or musculature NEUROLOGIC: Cranial nerves 2-12 grossly intact. Moves all extremities PSYCHIATRIC: Mood and affect appropriate to situation, no behavioral issues  Patient Active Problem List   Diagnosis Date Noted  . Major depressive disorder, single episode, unspecified 06/20/2017  . Peripheral vascular disease, unspecified ([place]) 04/05/2017  . Vascular dementia without behavioral disturbance 02/06/2017  . CKD (chronic kidney disease) stage 3, GFR 30-59 ml/min (HCC) 04/14/2016  . Hyperlipidemia 03/17/2016  . Fever 05/10/2014  . Nausea with vomiting 03/14/2014  . Mouth pain 11/17/2013  . Anxiety state 02/25/2013  . Lethargy  02/25/2013  . Osteoarthritis 02/04/2013  . Edema 02/04/2013  . Other convulsions 11/27/2012  . Unspecified constipation 11/27/2012  . History of pulmonary embolism 10/12/2012  . Long term current use of anticoagulant therapy 10/12/2012  . E. coli UTI 10/12/2012  . Essential hypertension, benign 10/12/2012  . Depression 10/12/2012  . Hypothyroidism 10/12/2012  . Dementia, multi-infarct 10/12/2012  . GERD (gastroesophageal reflux disease) 10/12/2012  . Psychosis ([place]) 10/12/2012  . Hypokalemia 10/12/2012  . Hyponatremia 10/12/2012    CMP     Component Value Date/Time   NA 139 10/14/2017   NA 139 10/14/2017   K 4.4 10/14/2017   K 4.4 10/14/2017   CL 98 08/28/2010 1845   CO2 31 08/28/2010 1845   GLUCOSE 89 08/28/2010 1845   BUN 19 10/14/2017   CREATININE 0.84 10/14/2017   CREATININE 0.8 10/14/2017   CALCIUM 9.1 10/14/2017   PROT 6 10/14/2017   ALBUMIN 4 10/14/2017   AST 19 10/14/2017   AST 19 10/14/2017   ALT 11 10/14/2017   ALT 11 10/14/2017  ALKPHOS 87 10/14/2017   ALKPHOS 87 10/14/2017   BILITOT 0.2 10/14/2017   GFRNONAA 54 (L) 08/28/2010 1845   GFRAA  08/28/2010 1845    >60        The eGFR has been calculated using the MDRD equation. This calculation has not been validated in all clinical situations. eGFR's persistently <60 mL/min signify possible Chronic Kidney Disease.   Recent Labs    10/14/17  NA 139  139  K 4.4  4.4  BUN 19  CREATININE 0.8  0.84  CALCIUM 9.1   Recent Labs    10/14/17  AST 19  19  ALT 11  11  ALKPHOS 87  87  BILITOT 0.2  PROT 6  ALBUMIN 4   Recent Labs    10/14/17  WBC 4.4  4.4  HGB 13.8  13.8  HCT 39  39.2  PLT 211   No results for input(s): CHOL, LDLCALC, TRIG in the last 8760 hours.  Invalid input(s): HCL No results found for: [facility] Lab Results  Component Value Date   TSH 1.68 10/14/2017   TSH 1.68 10/14/2017   Lab Results  Component Value Date   HGBA1C 5.3 10/14/2017   Lab Results    Component Value Date   CHOL 210 (A) 02/10/2017   HDL 77 (A) 02/10/2017   LDLCALC 123 02/10/2017   TRIG 49 02/10/2017    Significant Diagnostic Results in last 30 days:  No results found.  Assessment and Plan  Essential hypertension, benign Trolled; continue lisinopril 5 mg daily  Depression Stable: Continue Effexor 75 mg daily  Dementia, multi-infarct Mild to moderate, no decline; continue Namenda 10 mg twice daily     Tricia Martin. Tricia Coil, MD

## 2018-04-12 ENCOUNTER — Encounter: Payer: Self-pay | Admitting: Internal Medicine

## 2018-04-12 NOTE — Assessment & Plan Note (Signed)
Stable.  Continue Effexor 75 mg daily. 

## 2018-04-12 NOTE — Assessment & Plan Note (Signed)
Mild to moderate, no decline; continue Namenda 10 mg twice daily

## 2018-04-12 NOTE — Assessment & Plan Note (Signed)
Trolled; continue lisinopril 5 mg daily

## 2018-04-30 ENCOUNTER — Encounter: Payer: Self-pay | Admitting: Internal Medicine

## 2018-04-30 ENCOUNTER — Non-Acute Institutional Stay (SKILLED_NURSING_FACILITY): Payer: Medicare Other | Admitting: Internal Medicine

## 2018-04-30 DIAGNOSIS — Z7901 Long term (current) use of anticoagulants: Secondary | ICD-10-CM

## 2018-04-30 DIAGNOSIS — Z86711 Personal history of pulmonary embolism: Secondary | ICD-10-CM

## 2018-04-30 DIAGNOSIS — K219 Gastro-esophageal reflux disease without esophagitis: Secondary | ICD-10-CM | POA: Diagnosis not present

## 2018-04-30 NOTE — Progress Notes (Signed)
Location:  [facility] Room Number: [phone] Place of Service:  SNF [phone])  Tricia Martin. [name], MD  Patient Care Team: [name], MD as PCP - General (Internal Medicine)  Extended Emergency Contact Information Primary Emergency Contact: Culclasure,[name]  [place] Phone: [phone] Relation: Other    Allergies: Ciprofloxacin hcl; Codeine; Duloxetine hcl; Hctz [hydrochlorothiazide]; Metaxalone; Morphine and related; Olmesartan; Penicillins; Quetiapine fumarate; and Zofran [ondansetron hcl]  Chief Complaint  Patient presents with  . Medical Management of Chronic Issues    Routine Visit  . Health Maintenance    Influenza vacc.    HPI: Patient is 84 y.o. female who is being seen for routine issues of history of PE, chronic anticoagulation, and GERD.  Past Medical History:  Diagnosis Date  . Anxiety state 02/25/2013  . Chronic kidney disease, stage III (moderate) (HCC)   . CKD (chronic kidney disease) stage 3, GFR 30-59 ml/min (HCC) 04/14/2016  . Dementia, multi-infarct ([place]) 10/12/2012  . Depression 10/12/2012  . Gastroesophageal reflux disease without esophagitis   . Generalized anxiety disorder   . GERD (gastroesophageal reflux disease) 10/12/2012  . History of falling   . History of pulmonary embolism 10/12/2012  . Hyperlipidemia   . Hypertension   . Hypo-osmolality and hyponatremia   . Hypokalemia   . Hypothyroid   . Long term current use of anticoagulant therapy 10/12/2012  . Major depressive disorder, single episode, unspecified   . OA (osteoarthritis)   . Old myocardial infarction   . Osteoarthritis 02/04/2013  . PE (pulmonary embolism)   . Peripheral vascular disease, unspecified ([place])   . Vascular dementia without behavioral disturbance [facility])     Past Surgical History:  Procedure Laterality Date  . JOINT REPLACEMENT     B knees    Allergies as of 04/30/2018      Reactions   Ciprofloxacin Hcl    Codeine    Duloxetine Hcl    Hctz [hydrochlorothiazide]    Metaxalone    Morphine And Related    Olmesartan    Penicillins    Quetiapine Fumarate    Zofran [ondansetron Hcl]       Medication List        Accurate as of 04/30/18 11:59 PM. Always use your most recent med list.          calcium-vitamin D 500-200 MG-UNIT tablet Commonly known as:  OSCAL WITH D Take 1 tablet by mouth daily with breakfast.   DAILY VITE Tabs Take 1 tablet by mouth daily.   DIALYVITE VITAMIN D3 MAX 09735 units Tabs Generic drug:  Cholecalciferol Take 1 tablet by mouth once a week. 12 weeks for Vit D.   docusate sodium 100 MG capsule Commonly known as:  COLACE Take 100 mg by mouth 2 (two) times daily. Hold for loose stool   famotidine 20 MG tablet Commonly known as:  PEPCID Take 20 mg by mouth daily. For reflux   fish oil-omega-3 fatty acids 1000 MG capsule Take 1 g by mouth 2 (two) times daily. For hyperlipidemia   levothyroxine 75 MCG tablet Commonly known as:  SYNTHROID, LEVOTHROID Take 75 mcg by mouth daily. For hypothyroidism   lisinopril 5 MG tablet Commonly known as:  PRINIVIL,ZESTRIL Take 5 mg by mouth daily.   memantine 10 MG tablet Commonly known as:  NAMENDA Take 10 mg by mouth 2 (two) times daily. For dementia   polyethylene glycol packet Commonly known as:  MIRALAX / GLYCOLAX Take  17 g by mouth daily. HOLD FOR LOOSE STOOLS   rivaroxaban 20 MG Tabs tablet Commonly known as:  XARELTO Take 20 mg by mouth daily with supper.   sodium chloride 1 g tablet Take 1 g by mouth 3 (three) times daily with meals. For sodium supplement   TYLENOL 8 HOUR ARTHRITIS PAIN 650 MG CR tablet Generic drug:  acetaminophen Take 650 mg by mouth every 12 (twelve) hours. For arthritis   venlafaxine XR 75 MG 24 hr capsule Commonly known as:  EFFEXOR-XR Take 75 mg by mouth daily with breakfast.       No orders of the defined types were placed in this encounter.   Immunization History   Administered Date(s) Administered  . Influenza-Unspecified 04/26/2013, 04/21/2015, 05/28/2016, 05/29/2017  . Pneumococcal Conjugate-13 07/06/2017    Social History   Tobacco Use  . Smoking status: Never Smoker  . Smokeless tobacco: Never Used  Substance Use Topics  . Alcohol use: No    Review of Systems  DATA OBTAINED: from patient, nurse GENERAL:  no fevers, fatigue, appetite changes SKIN: No itching, rash HEENT: No complaint RESPIRATORY: No cough, wheezing, SOB CARDIAC: No chest pain, palpitations, lower extremity edema  GI: No abdominal pain, No N/V/D or constipation, No heartburn or reflux  GU: No dysuria, frequency or urgency, or incontinence  MUSCULOSKELETAL: No unrelieved bone/joint pain NEUROLOGIC: No headache, dizziness  PSYCHIATRIC: No overt anxiety or sadness  Vitals:   04/30/18 1329  BP: 134/71  Pulse: 72  Resp: 18  Temp: (!) 97.4 F (36.3 C)  SpO2: 95%   Body mass index is 34.54 kg/m. Physical Exam  GENERAL APPEARANCE: Alert, conversant, No acute distress  SKIN: No diaphoresis rash HEENT: Unremarkable RESPIRATORY: Breathing is even, unlabored.    CARDIOVASCULAR:  No peripheral edema  GASTROINTESTINAL: Abdomen not distended  GENITOURINARY: Bladder not distended  MUSCULOSKELETAL: No abnormal joints or musculature NEUROLOGIC: Cranial nerves 2-12 grossly intact. Moves all extremities PSYCHIATRIC: Mood and affect appropriate to situation, no behavioral issues  Patient Active Problem List   Diagnosis Date Noted  . Major depressive disorder, single episode, unspecified 06/20/2017  . Peripheral vascular disease, unspecified ([place]) 04/05/2017  . Vascular dementia without behavioral disturbance ([place]) 02/06/2017  . CKD (chronic kidney disease) stage 3, GFR 30-59 ml/min (HCC) 04/14/2016  . Hyperlipidemia 03/17/2016  . Fever 05/10/2014  . Nausea with vomiting 03/14/2014  . Mouth pain 11/17/2013  . Anxiety state 02/25/2013  . Lethargy 02/25/2013  .  Osteoarthritis 02/04/2013  . Edema 02/04/2013  . Other convulsions 11/27/2012  . Unspecified constipation 11/27/2012  . History of pulmonary embolism 10/12/2012  . Long term current use of anticoagulant therapy 10/12/2012  . E. coli UTI 10/12/2012  . Essential hypertension, benign 10/12/2012  . Depression 10/12/2012  . Hypothyroidism 10/12/2012  . Dementia, multi-infarct ([place]) 10/12/2012  . GERD (gastroesophageal reflux disease) 10/12/2012  . Psychosis ([place]) 10/12/2012  . Hypokalemia 10/12/2012  . Hyponatremia 10/12/2012    CMP     Component Value Date/Time   NA 141 05/11/2018   NA 139 10/14/2017   K 4.2 05/11/2018   K 4.4 10/14/2017   CL 98 08/28/2010 1845   CO2 31 08/28/2010 1845   GLUCOSE 89 08/28/2010 1845   BUN 9 05/11/2018   CREATININE 0.7 05/11/2018   CREATININE 0.84 10/14/2017   CALCIUM 9.1 10/14/2017   PROT 6 10/14/2017   ALBUMIN 4 10/14/2017   AST 5 (A) 05/11/2018   AST 19 10/14/2017   ALT 5 (A) 05/11/2018  ALT 11 10/14/2017   ALKPHOS 66 05/11/2018   ALKPHOS 87 10/14/2017   BILITOT 0.2 10/14/2017   GFRNONAA 54 (L) 08/28/2010 1845   GFRAA  08/28/2010 1845    >60        The eGFR has been calculated using the MDRD equation. This calculation has not been validated in all clinical situations. eGFR's persistently <60 mL/min signify possible Chronic Kidney Disease.   Recent Labs    10/14/17 05/06/18 05/11/18  NA 139  139 136* 141  K 4.4  4.4 4.1 4.2  BUN '19 18 9  ' CREATININE 0.8  0.84 0.8 0.7  CALCIUM 9.1  --   --    Recent Labs    10/14/17 05/06/18 05/11/18  AST '19  19 18 ' 5*  ALT '11  11 9 ' 5*  ALKPHOS 87  87 82 66  BILITOT 0.2  --   --   PROT 6  --   --   ALBUMIN 4  --   --    Recent Labs    10/14/17  WBC 4.4  4.4  HGB 13.8  13.8  HCT 39  39.2  PLT 211   Recent Labs    05/06/18 05/11/18  CHOL 191 123  LDLCALC 109 59  TRIG 49 107   No results found for: [facility] Lab Results  Component Value Date   TSH 1.63 05/11/2018    Lab Results  Component Value Date   HGBA1C 5.3 10/14/2017   Lab Results  Component Value Date   CHOL 123 05/11/2018   HDL 42 05/11/2018   LDLCALC 59 05/11/2018   TRIG 107 05/11/2018    Significant Diagnostic Results in last 30 days:  No results found.  Assessment and Plan  History of pulmonary embolism Chronic and stable; continue Xarelto 20 mg daily  Long term current use of anticoagulant therapy On Xarelto for chronic pulmonary embolism; continue Xarelto 20 mg daily  GERD (gastroesophageal reflux disease) No complaints; stable; continue Pepcid 20 mg daily     Zarianna Dicarlo D. [name], MD

## 2018-05-06 LAB — BASIC METABOLIC PANEL
BUN: 18 (ref 4–21)
CREATININE: 0.8 (ref 0.5–1.1)
GLUCOSE: 89
POTASSIUM: 4.1 (ref 3.4–5.3)
Sodium: 136 — AB (ref 137–147)

## 2018-05-06 LAB — LIPID PANEL
CHOLESTEROL: 191 (ref 0–200)
HDL: 72 — AB (ref 35–70)
LDL Cholesterol: 109
LDl/HDL Ratio: 2.6
Triglycerides: 49 (ref 40–160)

## 2018-05-06 LAB — HEPATIC FUNCTION PANEL
ALK PHOS: 82 (ref 25–125)
ALT: 9 (ref 7–35)
AST: 18 (ref 13–35)
Bilirubin, Total: 0.2

## 2018-05-06 LAB — VITAMIN D 25 HYDROXY (VIT D DEFICIENCY, FRACTURES): VIT D 25 HYDROXY: 59.04

## 2018-05-06 LAB — TSH: TSH: 2.34 (ref 0.41–5.90)

## 2018-05-11 LAB — BASIC METABOLIC PANEL
BUN: 9 (ref 4–21)
CREATININE: 0.7 (ref 0.5–1.1)
GLUCOSE: 80
POTASSIUM: 4.2 (ref 3.4–5.3)
Sodium: 141 (ref 137–147)

## 2018-05-11 LAB — LIPID PANEL
Cholesterol: 123 (ref 0–200)
HDL: 42 (ref 35–70)
LDL Cholesterol: 59
LDl/HDL Ratio: 2.9
Triglycerides: 107 (ref 40–160)

## 2018-05-11 LAB — HEPATIC FUNCTION PANEL
ALK PHOS: 66 (ref 25–125)
ALT: 5 — AB (ref 7–35)
AST: 5 — AB (ref 13–35)
BILIRUBIN, TOTAL: 0.4

## 2018-05-11 LAB — TSH: TSH: 1.63 (ref 0.41–5.90)

## 2018-05-22 ENCOUNTER — Encounter: Payer: Self-pay | Admitting: Internal Medicine

## 2018-05-22 NOTE — Assessment & Plan Note (Signed)
No complaints; stable; continue Pepcid 20 mg daily

## 2018-05-22 NOTE — Assessment & Plan Note (Signed)
On Xarelto for chronic pulmonary embolism; continue Xarelto 20 mg daily

## 2018-05-22 NOTE — Assessment & Plan Note (Signed)
Chronic and stable; continue Xarelto 20 mg daily 

## 2018-06-04 ENCOUNTER — Encounter: Payer: Self-pay | Admitting: Internal Medicine

## 2018-06-04 ENCOUNTER — Non-Acute Institutional Stay (SKILLED_NURSING_FACILITY): Payer: Medicare Other | Admitting: Internal Medicine

## 2018-06-04 DIAGNOSIS — I739 Peripheral vascular disease, unspecified: Secondary | ICD-10-CM | POA: Diagnosis not present

## 2018-06-04 DIAGNOSIS — F324 Major depressive disorder, single episode, in partial remission: Secondary | ICD-10-CM | POA: Diagnosis not present

## 2018-06-04 DIAGNOSIS — F0150 Vascular dementia without behavioral disturbance: Secondary | ICD-10-CM | POA: Diagnosis not present

## 2018-06-04 NOTE — Progress Notes (Signed)
Location:  [facility] Room Number: [id number] Place of Service:  SNF [phone])  [name], MD  Patient Care Team: [name], MD as PCP - General (Internal Medicine)  Extended Emergency Contact Information Primary Emergency Contact: Culclasure,[name]  [place] Phone: [phone] Relation: Other    Allergies: Ciprofloxacin hcl; Codeine; Duloxetine hcl; Hctz [hydrochlorothiazide]; Metaxalone; Morphine and related; Olmesartan; Penicillins; Quetiapine fumarate; and Zofran [ondansetron hcl]  Chief Complaint  Patient presents with  . Medical Management of Chronic Issues    Routine Visit  . Health Maintenance    Influenza vacc.    HPI: Patient is 84 y.o. female who is being seen for routine issues of depression, peripheral vascular disease, and vascular dementia.  Past Medical History:  Diagnosis Date  . Anxiety state 02/25/2013  . Chronic kidney disease, stage III (moderate) (HCC)   . CKD (chronic kidney disease) stage 3, GFR 30-59 ml/min (HCC) 04/14/2016  . Dementia, multi-infarct ([place]) 10/12/2012  . Depression 10/12/2012  . Gastroesophageal reflux disease without esophagitis   . Generalized anxiety disorder   . GERD (gastroesophageal reflux disease) 10/12/2012  . History of falling   . History of pulmonary embolism 10/12/2012  . Hyperlipidemia   . Hypertension   . Hypo-osmolality and hyponatremia   . Hypokalemia   . Hypothyroid   . Long term current use of anticoagulant therapy 10/12/2012  . Major depressive disorder, single episode, unspecified   . OA (osteoarthritis)   . Old myocardial infarction   . Osteoarthritis 02/04/2013  . PE (pulmonary embolism)   . Peripheral vascular disease, unspecified ([place])   . Vascular dementia without behavioral disturbance [facility])     Past Surgical History:  Procedure Laterality Date  . JOINT REPLACEMENT     B knees    Allergies as of 06/04/2018      Reactions   Ciprofloxacin  Hcl    Codeine    Duloxetine Hcl    Hctz [hydrochlorothiazide]    Metaxalone    Morphine And Related    Olmesartan    Penicillins    Quetiapine Fumarate    Zofran [ondansetron Hcl]       Medication List        Accurate as of 06/04/18 11:59 PM. Always use your most recent med list.          calcium-vitamin D 500-200 MG-UNIT tablet Commonly known as:  OSCAL WITH D Take 1 tablet by mouth daily with breakfast.   DAILY VITE Tabs Take 1 tablet by mouth daily.   DIALYVITE VITAMIN D3 MAX 1.25 MG (50000 UT) Tabs Generic drug:  Cholecalciferol Take 1 tablet by mouth once a week. 12 weeks for Vit D.   docusate sodium 100 MG capsule Commonly known as:  COLACE Take 100 mg by mouth 2 (two) times daily. Hold for loose stool   famotidine 20 MG tablet Commonly known as:  PEPCID Take 20 mg by mouth daily. For reflux   fish oil-omega-3 fatty acids 1000 MG capsule Take 1 g by mouth 2 (two) times daily. For hyperlipidemia   levothyroxine 75 MCG tablet Commonly known as:  SYNTHROID, LEVOTHROID Take 75 mcg by mouth daily. For hypothyroidism   lisinopril 5 MG tablet Commonly known as:  PRINIVIL,ZESTRIL Take 5 mg by mouth daily.   memantine 10 MG tablet Commonly known as:  NAMENDA Take 10 mg by mouth 2 (two) times daily. For dementia   polyethylene glycol packet Commonly known as:  MIRALAX /  GLYCOLAX Take 17 g by mouth daily. HOLD FOR LOOSE STOOLS   PREVIDENT 5000 PLUS 1.1 % Crea dental cream Generic drug:  sodium fluoride Place 1 application onto teeth every evening. BRUSH TEETH WITH TOOTHPASTE AFTER PM MOUTH CARE. SPIT OUT EXCESS AND DO NOT RINSE   rivaroxaban 20 MG Tabs tablet Commonly known as:  XARELTO Take 20 mg by mouth daily with supper.   sodium chloride 1 g tablet Take 1 g by mouth 3 (three) times daily with meals. For sodium supplement   TYLENOL 8 HOUR ARTHRITIS PAIN 650 MG CR tablet Generic drug:  acetaminophen Take 650 mg by mouth every 12 (twelve) hours.  For arthritis   venlafaxine XR 75 MG 24 hr capsule Commonly known as:  EFFEXOR-XR Take 75 mg by mouth daily with breakfast.       No orders of the defined types were placed in this encounter.   Immunization History  Administered Date(s) Administered  . Influenza-Unspecified 04/26/2013, 04/21/2015, 05/28/2016, 05/29/2017  . Pneumococcal Conjugate-13 07/06/2017    Social History   Tobacco Use  . Smoking status: Never Smoker  . Smokeless tobacco: Never Used  Substance Use Topics  . Alcohol use: No    Review of Systems  DATA OBTAINED: from patient, nurse GENERAL:  no fevers, fatigue, appetite changes SKIN: No itching, rash HEENT: No complaint RESPIRATORY: No cough, wheezing, SOB CARDIAC: No chest pain, palpitations, lower extremity edema  GI: No abdominal pain, No N/V/D or constipation, No heartburn or reflux  GU: No dysuria, frequency or urgency, or incontinence  MUSCULOSKELETAL: No unrelieved bone/joint pain NEUROLOGIC: No headache, dizziness  PSYCHIATRIC: No overt anxiety or sadness  Vitals:   06/04/18 1149  BP: (!) 141/75  Pulse: 62  Resp: 17  Temp: (!) 97.1 F (36.2 C)   Body mass index is 34.9 kg/m. Physical Exam  GENERAL APPEARANCE: Alert, conversant, No acute distress  SKIN: No diaphoresis rash HEENT: Unremarkable RESPIRATORY: Breathing is even, unlabored.  CARDIOVASCULAR: No peripheral edema  GASTROINTESTINAL: Abdomen is not distended  GENITOURINARY: Bladder not distended  MUSCULOSKELETAL: No abnormal joints or musculature NEUROLOGIC: Cranial nerves 2-12 grossly intact. Moves all extremities PSYCHIATRIC: Mood and affect appropriate to situation, no behavioral issues  Patient Active Problem List   Diagnosis Date Noted  . Major depressive disorder, single episode, unspecified 06/20/2017  . Peripheral vascular disease, unspecified ([place]) 04/05/2017  . Vascular dementia without behavioral disturbance ([place]) 02/06/2017  . CKD (chronic kidney disease)  stage 3, GFR 30-59 ml/min (HCC) 04/14/2016  . Hyperlipidemia 03/17/2016  . Fever 05/10/2014  . Nausea with vomiting 03/14/2014  . Mouth pain 11/17/2013  . Anxiety state 02/25/2013  . Lethargy 02/25/2013  . Osteoarthritis 02/04/2013  . Edema 02/04/2013  . Other convulsions 11/27/2012  . Unspecified constipation 11/27/2012  . History of pulmonary embolism 10/12/2012  . Long term current use of anticoagulant therapy 10/12/2012  . E. coli UTI 10/12/2012  . Essential hypertension, benign 10/12/2012  . Depression 10/12/2012  . Hypothyroidism 10/12/2012  . Dementia, multi-infarct ([place]) 10/12/2012  . GERD (gastroesophageal reflux disease) 10/12/2012  . Psychosis ([place]) 10/12/2012  . Hypokalemia 10/12/2012  . Hyponatremia 10/12/2012    CMP     Component Value Date/Time   NA 141 05/11/2018   NA 139 10/14/2017   K 4.2 05/11/2018   K 4.4 10/14/2017   CL 98 08/28/2010 1845   CO2 31 08/28/2010 1845   GLUCOSE 89 08/28/2010 1845   BUN 9 05/11/2018   CREATININE 0.7 05/11/2018   CREATININE 0.84  10/14/2017   CALCIUM 9.1 10/14/2017   PROT 6 10/14/2017   ALBUMIN 4 10/14/2017   AST 5 (A) 05/11/2018   AST 19 10/14/2017   ALT 5 (A) 05/11/2018   ALT 11 10/14/2017   ALKPHOS 66 05/11/2018   ALKPHOS 87 10/14/2017   BILITOT 0.2 10/14/2017   GFRNONAA 54 (L) 08/28/2010 1845   GFRAA  08/28/2010 1845    >60        The eGFR has been calculated using the MDRD equation. This calculation has not been validated in all clinical situations. eGFR's persistently <60 mL/min signify possible Chronic Kidney Disease.   Recent Labs    10/14/17 05/06/18 05/11/18  NA 139  139 136* 141  K 4.4  4.4 4.1 4.2  BUN _0 CREATININE 0.8  0.84 0.8 0.7  CALCIUM 9.1  --   --    Recent Labs    10/14/17 05/06/18 05/11/18  AST _1 5*  ALT _2 5*  ALKPHOS 87  87 82 66  BILITOT 0.2  --   --   PROT 6  --   --   ALBUMIN 4  --   --    Recent Labs    10/14/17  WBC 4.4  4.4  HGB 13.8   13.8  HCT 39  39.2  PLT 211   Recent Labs    05/06/18 05/11/18  CHOL 191 123  LDLCALC 109 59  TRIG 49 107   No results found for: [facility] Lab Results  Component Value Date   TSH 1.63 05/11/2018   Lab Results  Component Value Date   HGBA1C 5.3 10/14/2017   Lab Results  Component Value Date   CHOL 123 05/11/2018   HDL 42 05/11/2018   LDLCALC 59 05/11/2018   TRIG 107 05/11/2018    Significant Diagnostic Results in last 30 days:  No results found.  Assessment and Plan  Major depressive disorder, single episode, unspecified Appears stable; patient is out of room daily socializing; continue Effexor 75 mg daily  Peripheral vascular disease, unspecified ([place]) No reported skin problems or wounds; continue Xarelto 20 mg daily  Vascular dementia without behavioral disturbance Stable, no declines; continue Namenda 10 mg twice daily     Nija Koopman D. [name], MD

## 2018-06-07 ENCOUNTER — Encounter: Payer: Self-pay | Admitting: Internal Medicine

## 2018-06-07 NOTE — Assessment & Plan Note (Signed)
No reported skin problems or wounds; continue Xarelto 20 mg daily

## 2018-06-07 NOTE — Assessment & Plan Note (Signed)
Stable, no declines; continue Namenda 10 mg twice daily

## 2018-06-07 NOTE — Assessment & Plan Note (Signed)
Appears stable; patient is out of room daily socializing; continue Effexor 75 mg daily

## 2018-06-12 LAB — VITAMIN D 25 HYDROXY (VIT D DEFICIENCY, FRACTURES): Vit D, 25-Hydroxy: >60

## 2018-07-02 ENCOUNTER — Encounter: Payer: Self-pay | Admitting: Internal Medicine

## 2018-07-02 ENCOUNTER — Non-Acute Institutional Stay (SKILLED_NURSING_FACILITY): Payer: Medicare Other | Admitting: Internal Medicine

## 2018-07-02 DIAGNOSIS — E034 Atrophy of thyroid (acquired): Secondary | ICD-10-CM | POA: Diagnosis not present

## 2018-07-02 DIAGNOSIS — F0150 Vascular dementia without behavioral disturbance: Secondary | ICD-10-CM

## 2018-07-02 DIAGNOSIS — I10 Essential (primary) hypertension: Secondary | ICD-10-CM | POA: Diagnosis not present

## 2018-07-02 NOTE — Progress Notes (Signed)
Location:  [place] Room Number: [phone] Place of Service:  SNF [phone])  Tricia Delaine. [name], MD  Patient Care Team: [name], MD as PCP - General (Internal Medicine)  Extended Emergency Contact Information Primary Emergency Contact: Culclasure,[name]  [place] Phone: [phone] Relation: Other    Allergies: Ciprofloxacin hcl; Codeine; Duloxetine hcl; Hctz [hydrochlorothiazide]; Metaxalone; Morphine and related; Olmesartan; Penicillins; Quetiapine fumarate; and Zofran [ondansetron hcl]  Chief Complaint  Patient presents with  . Medical Management of Chronic Issues    Routine visit    HPI: Patient is 84 y.o. female who is been seen for routine issues of hypertension, dementia, and hypothyroidism.  Past Medical History:  Diagnosis Date  . Anxiety state 02/25/2013  . Chronic kidney disease, stage III (moderate) (HCC)   . CKD (chronic kidney disease) stage 3, GFR 30-59 ml/min (HCC) 04/14/2016  . Dementia, multi-infarct ([place]) 10/12/2012  . Depression 10/12/2012  . Gastroesophageal reflux disease without esophagitis   . Generalized anxiety disorder   . GERD (gastroesophageal reflux disease) 10/12/2012  . History of falling   . History of pulmonary embolism 10/12/2012  . Hyperlipidemia   . Hypertension   . Hypo-osmolality and hyponatremia   . Hypokalemia   . Hypothyroid   . Long term current use of anticoagulant therapy 10/12/2012  . Major depressive disorder, single episode, unspecified   . OA (osteoarthritis)   . Old myocardial infarction   . Osteoarthritis 02/04/2013  . PE (pulmonary embolism)   . Peripheral vascular disease, unspecified ([place])   . Vascular dementia without behavioral disturbance [facility])     Past Surgical History:  Procedure Laterality Date  . JOINT REPLACEMENT     B knees    Allergies as of 07/02/2018      Reactions   Ciprofloxacin Hcl    Codeine    Duloxetine Hcl    Hctz  [hydrochlorothiazide]    Metaxalone    Morphine And Related    Olmesartan    Penicillins    Quetiapine Fumarate    Zofran [ondansetron Hcl]       Medication List       Accurate as of July 02, 2018 11:59 PM. Always use your most recent med list.        calcium-vitamin D 500-200 MG-UNIT tablet Commonly known as:  OSCAL WITH D Take 1 tablet by mouth daily with breakfast.   DAILY VITE Tabs Take 1 tablet by mouth daily.   DIALYVITE VITAMIN D3 MAX 1.25 MG (50000 UT) Tabs Generic drug:  Cholecalciferol Take 1 tablet by mouth once a week. 12 weeks for Vit D.   docusate sodium 100 MG capsule Commonly known as:  COLACE Take 100 mg by mouth 2 (two) times daily. Hold for loose stool   famotidine 20 MG tablet Commonly known as:  PEPCID Take 20 mg by mouth daily. For reflux   fish oil-omega-3 fatty acids 1000 MG capsule Take 1 g by mouth 2 (two) times daily. For hyperlipidemia   levothyroxine 75 MCG tablet Commonly known as:  SYNTHROID, LEVOTHROID Take 75 mcg by mouth daily. For hypothyroidism   lisinopril 5 MG tablet Commonly known as:  PRINIVIL,ZESTRIL Take 5 mg by mouth daily.   memantine 10 MG tablet Commonly known as:  NAMENDA Take 10 mg by mouth 2 (two) times daily. For dementia   polyethylene glycol packet Commonly known as:  MIRALAX / GLYCOLAX Take 17 g by mouth daily. HOLD FOR LOOSE STOOLS  PREVIDENT 5000 PLUS 1.1 % Crea dental cream Generic drug:  sodium fluoride Place 1 application onto teeth every evening. BRUSH TEETH WITH TOOTHPASTE AFTER PM MOUTH CARE. SPIT OUT EXCESS AND DO NOT RINSE   rivaroxaban 20 MG Tabs tablet Commonly known as:  XARELTO Take 20 mg by mouth daily with supper.   sodium chloride 1 g tablet Take 1 g by mouth 3 (three) times daily with meals. For sodium supplement   TYLENOL 8 HOUR ARTHRITIS PAIN 650 MG CR tablet Generic drug:  acetaminophen Take 650 mg by mouth every 12 (twelve) hours. For arthritis   venlafaxine XR 75 MG 24  hr capsule Commonly known as:  EFFEXOR-XR Take 75 mg by mouth daily with breakfast.       No orders of the defined types were placed in this encounter.   Immunization History  Administered Date(s) Administered  . Influenza-Unspecified 04/26/2013, 04/21/2015, 05/28/2016, 05/29/2017  . Pneumococcal Conjugate-13 07/06/2017    Social History   Tobacco Use  . Smoking status: Never Smoker  . Smokeless tobacco: Never Used  Substance Use Topics  . Alcohol use: No    Review of Systems  DATA OBTAINED: from nursing GENERAL:  no fevers, fatigue, appetite changes SKIN: No itching, rash HEENT: No complaint RESPIRATORY: No cough, wheezing, SOB CARDIAC: No chest pain, palpitations, lower extremity edema  GI: No abdominal pain, No N/V/D or constipation, No heartburn or reflux  GU: No dysuria, frequency or urgency, or incontinence  MUSCULOSKELETAL: No unrelieved bone/joint pain NEUROLOGIC: No headache, dizziness  PSYCHIATRIC: No overt anxiety or sadness  Vitals:   07/02/18 1341  BP: 124/69  Pulse: 68  Resp: 16  Temp: 97.9 F (36.6 C)   Body mass index is 35.19 kg/m. Physical Exam  GENERAL APPEARANCE: Alert, conversant, No acute distress; white female in wheelchair in hall SKIN: No diaphoresis rash HEENT: Unremarkable RESPIRATORY: Breathing is even, unlabored.  CARDIOVASCULAR: . No peripheral edema  GASTROINTESTINAL: Abdomen is  not distended   GENITOURINARY: Bladder not distended  MUSCULOSKELETAL: No abnormal joints or musculature NEUROLOGIC: Cranial nerves 2-12 grossly intact. Moves all extremities PSYCHIATRIC: Mood and affect appropriate to situation, no behavioral issues  Patient Active Problem List   Diagnosis Date Noted  . Major depressive disorder, single episode, unspecified 06/20/2017  . Peripheral vascular disease, unspecified ([place]) 04/05/2017  . Vascular dementia without behavioral disturbance ([place]) 02/06/2017  . CKD (chronic kidney disease) stage 3, GFR  30-59 ml/min (HCC) 04/14/2016  . Hyperlipidemia 03/17/2016  . Fever 05/10/2014  . Nausea with vomiting 03/14/2014  . Mouth pain 11/17/2013  . Anxiety state 02/25/2013  . Lethargy 02/25/2013  . Osteoarthritis 02/04/2013  . Edema 02/04/2013  . Other convulsions 11/27/2012  . Unspecified constipation 11/27/2012  . History of pulmonary embolism 10/12/2012  . Long term current use of anticoagulant therapy 10/12/2012  . E. coli UTI 10/12/2012  . Essential hypertension, benign 10/12/2012  . Depression 10/12/2012  . Hypothyroidism 10/12/2012  . Dementia, multi-infarct ([place]) 10/12/2012  . GERD (gastroesophageal reflux disease) 10/12/2012  . Psychosis ([place]) 10/12/2012  . Hypokalemia 10/12/2012  . Hyponatremia 10/12/2012    CMP     Component Value Date/Time   NA 141 05/11/2018   NA 139 10/14/2017   K 4.2 05/11/2018   K 4.4 10/14/2017   CL 98 08/28/2010 1845   CO2 31 08/28/2010 1845   GLUCOSE 89 08/28/2010 1845   BUN 9 05/11/2018   CREATININE 0.7 05/11/2018   CREATININE 0.84 10/14/2017   CALCIUM 9.1 10/14/2017  PROT 6 10/14/2017   ALBUMIN 4 10/14/2017   AST 5 (A) 05/11/2018   AST 19 10/14/2017   ALT 5 (A) 05/11/2018   ALT 11 10/14/2017   ALKPHOS 66 05/11/2018   ALKPHOS 87 10/14/2017   BILITOT 0.2 10/14/2017   GFRNONAA 54 (L) 08/28/2010 1845   GFRAA  08/28/2010 1845    >60        The eGFR has been calculated using the MDRD equation. This calculation has not been validated in all clinical situations. eGFR's persistently <60 mL/min signify possible Chronic Kidney Disease.   Recent Labs    10/14/17 05/06/18 05/11/18  NA 139  139 136* 141  K 4.4  4.4 4.1 4.2  BUN '19 18 9  ' CREATININE 0.8  0.84 0.8 0.7  CALCIUM 9.1  --   --    Recent Labs    10/14/17 05/06/18 05/11/18  AST '19  19 18 ' 5*  ALT '11  11 9 ' 5*  ALKPHOS 87  87 82 66  BILITOT 0.2  --   --   PROT 6  --   --   ALBUMIN 4  --   --    Recent Labs    10/14/17  WBC 4.4  4.4  HGB 13.8  13.8  HCT 39   39.2  PLT 211   Recent Labs    05/06/18 05/11/18  CHOL 191 123  LDLCALC 109 59  TRIG 49 107   No results found for: [facility] Lab Results  Component Value Date   TSH 1.63 05/11/2018   Lab Results  Component Value Date   HGBA1C 5.3 10/14/2017   Lab Results  Component Value Date   CHOL 123 05/11/2018   HDL 42 05/11/2018   LDLCALC 59 05/11/2018   TRIG 107 05/11/2018    Significant Diagnostic Results in last 30 days:  No results found.  Assessment and Plan  Essential hypertension, benign Controlled; continue Norvasc 5 mg daily  Dementia, multi-infarct Continues without decline, mild to moderate; continue Namenda 10 mg twice daily  Hypothyroidism TSH 1.63; continue Synthroid 75 mcg daily     Caryl Manas D. [name], MD

## 2018-07-11 ENCOUNTER — Encounter: Payer: Self-pay | Admitting: Internal Medicine

## 2018-07-11 NOTE — Assessment & Plan Note (Signed)
TSH 1.63; continue Synthroid 75 mcg daily

## 2018-07-11 NOTE — Assessment & Plan Note (Signed)
Continues without decline, mild to moderate; continue Namenda 10 mg twice daily

## 2018-07-11 NOTE — Assessment & Plan Note (Signed)
Controlled; continue Norvasc 5 mg daily 

## 2018-08-04 ENCOUNTER — Non-Acute Institutional Stay (SKILLED_NURSING_FACILITY): Payer: Medicare Other | Admitting: Internal Medicine

## 2018-08-04 ENCOUNTER — Encounter: Payer: Self-pay | Admitting: Internal Medicine

## 2018-08-04 DIAGNOSIS — F329 Major depressive disorder, single episode, unspecified: Secondary | ICD-10-CM | POA: Diagnosis not present

## 2018-08-04 DIAGNOSIS — F32A Depression, unspecified: Secondary | ICD-10-CM

## 2018-08-04 DIAGNOSIS — N183 Chronic kidney disease, stage 3 unspecified: Secondary | ICD-10-CM

## 2018-08-04 DIAGNOSIS — E785 Hyperlipidemia, unspecified: Secondary | ICD-10-CM

## 2018-08-04 NOTE — Progress Notes (Signed)
Location:  [place] [facility] [place] Room Number: [phone] Place of Service:  SNF (31)  [name], MD  Patient Care Team: [name], MD as PCP - General (Internal Medicine)  Extended Emergency Contact Information Primary Emergency Contact: Culclasure,[name]  [place] Phone: [phone] Relation: Other    Allergies: Ciprofloxacin hcl; Codeine; Duloxetine hcl; Hctz [hydrochlorothiazide]; Metaxalone; Morphine and related; Olmesartan; Penicillins; Quetiapine fumarate; and Zofran [ondansetron hcl]  Chief Complaint  Patient presents with  . Medical Management of Chronic Issues    Routine Visit    HPI: Patient is 85 y.o. female who is being seen for routine issues of depression, hyperlipidemia, and chronic kidney disease stage III.  Past Medical History:  Diagnosis Date  . Anxiety state 02/25/2013  . Chronic kidney disease, stage III (moderate) (HCC)   . CKD (chronic kidney disease) stage 3, GFR 30-59 ml/min (HCC) 04/14/2016  . Dementia, multi-infarct ([place]) 10/12/2012  . Depression 10/12/2012  . Gastroesophageal reflux disease without esophagitis   . Generalized anxiety disorder   . GERD (gastroesophageal reflux disease) 10/12/2012  . History of falling   . History of pulmonary embolism 10/12/2012  . Hyperlipidemia   . Hypertension   . Hypo-osmolality and hyponatremia   . Hypokalemia   . Hypothyroid   . Long term current use of anticoagulant therapy 10/12/2012  . Major depressive disorder, single episode, unspecified   . OA (osteoarthritis)   . Old myocardial infarction   . Osteoarthritis 02/04/2013  . PE (pulmonary embolism)   . Peripheral vascular disease, unspecified ([place])   . Vascular dementia without behavioral disturbance [facility])     Past Surgical History:  Procedure Laterality Date  . JOINT REPLACEMENT     B knees    Allergies as of 08/04/2018      Reactions   Ciprofloxacin Hcl    Codeine    Duloxetine Hcl    Hctz [hydrochlorothiazide]    Metaxalone    Morphine And Related    Olmesartan    Penicillins    Quetiapine Fumarate    Zofran [ondansetron Hcl]       Medication List       Accurate as of August 04, 2018 11:59 PM. Always use your most recent med list.        calcium-vitamin D 500-200 MG-UNIT tablet Commonly known as:  OSCAL WITH D Take 1 tablet by mouth daily with breakfast.   DAILY VITE Tabs Take 1 tablet by mouth daily.   docusate sodium 100 MG capsule Commonly known as:  COLACE Take 100 mg by mouth 2 (two) times daily. Hold for loose stool   famotidine 20 MG tablet Commonly known as:  PEPCID Take 20 mg by mouth daily. For reflux   fish oil-omega-3 fatty acids 1000 MG capsule Take 1 g by mouth 2 (two) times daily. For hyperlipidemia   levothyroxine 75 MCG tablet Commonly known as:  SYNTHROID, LEVOTHROID Take 75 mcg by mouth daily. For hypothyroidism   lisinopril 5 MG tablet Commonly known as:  PRINIVIL,ZESTRIL Take 5 mg by mouth daily.   memantine 10 MG tablet Commonly known as:  NAMENDA Take 10 mg by mouth 2 (two) times daily. For dementia   polyethylene glycol packet Commonly known as:  MIRALAX / GLYCOLAX Take 17 g by mouth daily. HOLD FOR LOOSE STOOLS   PREVIDENT 5000 PLUS 1.1 % Crea dental cream Generic drug:  sodium fluoride Place 1 application onto teeth every evening. BRUSH TEETH WITH TOOTHPASTE  AFTER PM MOUTH CARE. SPIT OUT EXCESS AND DO NOT RINSE   rivaroxaban 20 MG Tabs tablet Commonly known as:  XARELTO Take 20 mg by mouth daily with supper.   sodium chloride 1 g tablet Take 1 g by mouth 3 (three) times daily with meals. For sodium supplement   TYLENOL 8 HOUR ARTHRITIS PAIN 650 MG CR tablet Generic drug:  acetaminophen Take 650 mg by mouth every 12 (twelve) hours. For arthritis   venlafaxine XR 75 MG 24 hr capsule Commonly known as:  EFFEXOR-XR Take 75 mg by mouth daily with breakfast.       No orders of the defined types were  placed in this encounter.   Immunization History  Administered Date(s) Administered  . Influenza-Unspecified 04/26/2013, 04/21/2015, 05/28/2016, 05/29/2017  . Pneumococcal Conjugate-13 07/06/2017    Social History   Tobacco Use  . Smoking status: Never Smoker  . Smokeless tobacco: Never Used  Substance Use Topics  . Alcohol use: No    Review of Systems  DATA OBTAINED: from patient, nurse GENERAL:  no fevers, fatigue, appetite changes SKIN: No itching, rash HEENT: No complaint RESPIRATORY: No cough, wheezing, SOB CARDIAC: No chest pain, palpitations, lower extremity edema  GI: No abdominal pain, No N/V/D or constipation, No heartburn or reflux  GU: No dysuria, frequency or urgency, or incontinence  MUSCULOSKELETAL: No unrelieved bone/joint pain NEUROLOGIC: No headache, dizziness  PSYCHIATRIC: No overt anxiety or sadness  Vitals:   08/04/18 1033  BP: 126/69  Pulse: 74  Resp: 16  Temp: (!) 97.3 F (36.3 C)   Body mass index is 35.83 kg/m. Physical Exam  GENERAL APPEARANCE: Alert, conversant, No acute distress: Sitting in the hall in her wheelchair as she does most days SKIN: No diaphoresis rash HEENT: Unremarkable RESPIRATORY: Breathing is even, unlabored. Lung sounds are clear   CARDIOVASCULAR: Heart RRR no murmurs, rubs or gallops. No peripheral edema  GASTROINTESTINAL: Abdomen is soft, non-tender, not distended GENITOURINARY: Bladder non tender, not distended  MUSCULOSKELETAL: No abnormal joints or musculature NEUROLOGIC: Cranial nerves 2-12 grossly intact. Moves all extremities PSYCHIATRIC: Mood and affect appropriate to situation, no behavioral issues  Patient Active Problem List   Diagnosis Date Noted  . Major depressive disorder, single episode, unspecified 06/20/2017  . Peripheral vascular disease, unspecified ([place]) 04/05/2017  . Vascular dementia without behavioral disturbance ([place]) 02/06/2017  . CKD (chronic kidney disease) stage 3, GFR 30-59 ml/min  (HCC) 04/14/2016  . Hyperlipidemia 03/17/2016  . Fever 05/10/2014  . Nausea with vomiting 03/14/2014  . Mouth pain 11/17/2013  . Anxiety state 02/25/2013  . Lethargy 02/25/2013  . Osteoarthritis 02/04/2013  . Edema 02/04/2013  . Other convulsions 11/27/2012  . Unspecified constipation 11/27/2012  . History of pulmonary embolism 10/12/2012  . Long term current use of anticoagulant therapy 10/12/2012  . E. coli UTI 10/12/2012  . Essential hypertension, benign 10/12/2012  . Depression 10/12/2012  . Hypothyroidism 10/12/2012  . Dementia, multi-infarct ([place]) 10/12/2012  . GERD (gastroesophageal reflux disease) 10/12/2012  . Psychosis ([place]) 10/12/2012  . Hypokalemia 10/12/2012  . Hyponatremia 10/12/2012    CMP     Component Value Date/Time   NA 141 05/11/2018   NA 139 10/14/2017   K 4.2 05/11/2018   K 4.4 10/14/2017   CL 98 08/28/2010 1845   CO2 31 08/28/2010 1845   GLUCOSE 89 08/28/2010 1845   BUN 9 05/11/2018   CREATININE 0.7 05/11/2018   CREATININE 0.84 10/14/2017   CALCIUM 9.1 10/14/2017   PROT 6 10/14/2017  ALBUMIN 4 10/14/2017   AST 5 (A) 05/11/2018   AST 19 10/14/2017   ALT 5 (A) 05/11/2018   ALT 11 10/14/2017   ALKPHOS 66 05/11/2018   ALKPHOS 87 10/14/2017   BILITOT 0.2 10/14/2017   GFRNONAA 54 (L) 08/28/2010 1845   GFRAA  08/28/2010 1845    >60        The eGFR has been calculated using the MDRD equation. This calculation has not been validated in all clinical situations. eGFR's persistently <60 mL/min signify possible Chronic Kidney Disease.   Recent Labs    10/14/17 05/06/18 05/11/18  NA 139  139 136* 141  K 4.4  4.4 4.1 4.2  BUN _0 CREATININE 0.8  0.84 0.8 0.7  CALCIUM 9.1  --   --    Recent Labs    10/14/17 05/06/18 05/11/18  AST _1 5*  ALT _2 5*  ALKPHOS 87  87 82 66  BILITOT 0.2  --   --   PROT 6  --   --   ALBUMIN 4  --   --    Recent Labs    10/14/17  WBC 4.4  4.4  HGB 13.8  13.8  HCT 39  39.2  PLT  211   Recent Labs    05/06/18 05/11/18  CHOL 191 123  LDLCALC 109 59  TRIG 49 107   No results found for: [facility] Lab Results  Component Value Date   TSH 1.63 05/11/2018   Lab Results  Component Value Date   HGBA1C 5.3 10/14/2017   Lab Results  Component Value Date   CHOL 123 05/11/2018   HDL 42 05/11/2018   LDLCALC 59 05/11/2018   TRIG 107 05/11/2018    Significant Diagnostic Results in last 30 days:  No results found.  Assessment and Plan  Depression Appears stable; continue Effexor 75 mg daily  Hyperlipidemia LDL 59, HDL 42, good control continue fish oil 1 g daily  CKD (chronic kidney disease) stage 3, GFR 30-59 ml/min No recent GFR but most recent creatinine is 0.7 which is excellent; will monitor intervals    Rehmat Murtagh D. [name], MD

## 2018-08-06 ENCOUNTER — Encounter: Payer: Self-pay | Admitting: Internal Medicine

## 2018-08-06 NOTE — Assessment & Plan Note (Signed)
No recent GFR but most recent creatinine is 0.7 which is excellent; will monitor intervals

## 2018-08-06 NOTE — Assessment & Plan Note (Signed)
Appears stable; continue Effexor 75 mg daily

## 2018-08-06 NOTE — Assessment & Plan Note (Signed)
LDL 59, HDL 42, good control continue fish oil 1 g daily

## 2018-09-21 LAB — LIPID PANEL
Cholesterol: 187 (ref 0–200)
HDL: 75 — AB (ref 35–70)
LDL Cholesterol: 103
LDl/HDL Ratio: 2.5
Triglycerides: 41 (ref 40–160)

## 2018-09-21 LAB — BASIC METABOLIC PANEL
BUN: 17 (ref 4–21)
BUN: 17 (ref 4–21)
CREATININE: 0.7 (ref 0.5–1.1)
CREATININE: 0.7 (ref 0.5–1.1)
GLUCOSE: 93
Glucose: 93
Potassium: 4.4 (ref 3.4–5.3)
Potassium: 4.4 (ref 3.4–5.3)
SODIUM: 135 — AB (ref 137–147)
Sodium: 135 — AB (ref 137–147)

## 2018-09-21 LAB — HEPATIC FUNCTION PANEL
ALK PHOS: 78 (ref 25–125)
ALT: 11 (ref 7–35)
ALT: 11 (ref 7–35)
AST: 22 (ref 13–35)
AST: 22 (ref 13–35)
Alkaline Phosphatase: 78 (ref 25–125)
Bilirubin, Total: 0.2
Bilirubin, Total: 0.2

## 2018-09-21 LAB — CBC AND DIFFERENTIAL
HEMATOCRIT: 38 (ref 36–46)
Hemoglobin: 13 (ref 12.0–16.0)
PLATELETS: 227 (ref 150–399)
WBC: 5.1

## 2018-09-21 LAB — TSH: TSH: 1.41 (ref 0.41–5.90)

## 2018-09-21 LAB — VITAMIN D 25 HYDROXY (VIT D DEFICIENCY, FRACTURES): VIT D 25 HYDROXY: 46.48

## 2018-10-05 LAB — BASIC METABOLIC PANEL
BUN: 22 — AB (ref 4–21)
Creatinine: 0.8 (ref 0.5–1.1)
Glucose: 89
Potassium: 4.4 (ref 3.4–5.3)
Sodium: 134 — AB (ref 137–147)

## 2018-11-05 ENCOUNTER — Encounter: Payer: Self-pay | Admitting: Internal Medicine

## 2018-11-05 ENCOUNTER — Non-Acute Institutional Stay (SKILLED_NURSING_FACILITY): Payer: Medicare Other | Admitting: Internal Medicine

## 2018-11-05 DIAGNOSIS — I10 Essential (primary) hypertension: Secondary | ICD-10-CM

## 2018-11-05 DIAGNOSIS — I739 Peripheral vascular disease, unspecified: Secondary | ICD-10-CM

## 2018-11-05 DIAGNOSIS — F0150 Vascular dementia without behavioral disturbance: Secondary | ICD-10-CM

## 2018-11-05 NOTE — Progress Notes (Signed)
Location:  [place] Room Number: [phone] Place of Service:  SNF [phone])  Tricia Martin. Tricia Coil, MD  Patient Care Team: [name], MD as PCP - General (Internal Medicine)  Extended Emergency Contact Information Primary Emergency Contact: Martin,Tricia  [place] Phone: [phone] Relation: Other    Allergies: Ciprofloxacin hcl; Codeine; Duloxetine hcl; Hctz [hydrochlorothiazide]; Metaxalone; Morphine and related; Olmesartan; Penicillins; Quetiapine fumarate; and Zofran [ondansetron hcl]  Chief Complaint  Patient presents with  . Medical Management of Chronic Issues    Routine visit    HPI: Patient is 85 y.o. female who is being seen for routine issues of hypertension, dementia, and peripheral vascular disease.  Past Medical History:  Diagnosis Date  . Anxiety state 02/25/2013  . Chronic kidney disease, stage III (moderate) (HCC)   . CKD (chronic kidney disease) stage 3, GFR 30-59 ml/min (HCC) 04/14/2016  . Dementia, multi-infarct ([place]) 10/12/2012  . Depression 10/12/2012  . Gastroesophageal reflux disease without esophagitis   . Generalized anxiety disorder   . GERD (gastroesophageal reflux disease) 10/12/2012  . History of falling   . History of pulmonary embolism 10/12/2012  . Hyperlipidemia   . Hypertension   . Hypo-osmolality and hyponatremia   . Hypokalemia   . Hypothyroid   . Long term current use of anticoagulant therapy 10/12/2012  . Major depressive disorder, single episode, unspecified   . OA (osteoarthritis)   . Old myocardial infarction   . Osteoarthritis 02/04/2013  . PE (pulmonary embolism)   . Peripheral vascular disease, unspecified ([place])   . Vascular dementia without behavioral disturbance [facility])     Past Surgical History:  Procedure Laterality Date  . JOINT REPLACEMENT     B knees    Allergies as of 11/05/2018      Reactions   Ciprofloxacin Hcl    Codeine    Duloxetine Hcl    Hctz  [hydrochlorothiazide]    Metaxalone    Morphine And Related    Olmesartan    Penicillins    Quetiapine Fumarate    Zofran [ondansetron Hcl]       Medication List       Accurate as of November 05, 2018 11:59 PM. Always use your most recent med list.        calcium-vitamin D 500-200 MG-UNIT tablet Commonly known as:  OSCAL WITH D Take 1 tablet by mouth daily with breakfast.   Daily Vite Tabs Take 1 tablet by mouth daily.   docusate sodium 100 MG capsule Commonly known as:  COLACE Take 100 mg by mouth 2 (two) times daily. Hold for loose stool   famotidine 20 MG tablet Commonly known as:  PEPCID Take 20 mg by mouth daily. For reflux   fish oil-omega-3 fatty acids 1000 MG capsule Take 1 g by mouth 2 (two) times daily. For hyperlipidemia   levothyroxine 75 MCG tablet Commonly known as:  SYNTHROID Take 75 mcg by mouth daily. For hypothyroidism   lisinopril 5 MG tablet Commonly known as:  ZESTRIL Take 5 mg by mouth daily.   memantine 10 MG tablet Commonly known as:  NAMENDA Take 10 mg by mouth 2 (two) times daily. For dementia   polyethylene glycol 17 g packet Commonly known as:  MIRALAX / GLYCOLAX Take 17 g by mouth daily. HOLD FOR LOOSE STOOLS   PreviDent 5000 Plus 1.1 % Crea dental cream Generic drug:  sodium fluoride Place 1 application onto teeth every evening. BRUSH TEETH WITH TOOTHPASTE  AFTER PM MOUTH CARE. SPIT OUT EXCESS AND DO NOT RINSE   rivaroxaban 20 MG Tabs tablet Commonly known as:  XARELTO Take 20 mg by mouth daily with supper.   sodium chloride 1 g tablet Take 1 g by mouth 3 (three) times daily with meals. For sodium supplement   Tylenol 8 Hour Arthritis Pain 650 MG CR tablet Generic drug:  acetaminophen Take 650 mg by mouth every 12 (twelve) hours. For arthritis   venlafaxine XR 75 MG 24 hr capsule Commonly known as:  EFFEXOR-XR Take 75 mg by mouth daily with breakfast.       No orders of the defined types were placed in this encounter.    Immunization History  Administered Date(s) Administered  . Influenza-Unspecified 04/26/2013, 04/21/2015, 05/28/2016, 05/29/2017  . Pneumococcal Conjugate-13 07/06/2017    Social History   Tobacco Use  . Smoking status: Never Smoker  . Smokeless tobacco: Never Used  Substance Use Topics  . Alcohol use: No    Review of Systems  DATA OBTAINED: from patient, nurse GENERAL:  no fevers, fatigue, appetite changes SKIN: No itching, rash HEENT: No complaint RESPIRATORY: No cough, wheezing, SOB CARDIAC: No chest pain, palpitations, lower extremity edema  GI: No abdominal pain, No N/V/D or constipation, No heartburn or reflux  GU: No dysuria, frequency or urgency, or incontinence  MUSCULOSKELETAL: No unrelieved bone/joint pain NEUROLOGIC: No headache, dizziness  PSYCHIATRIC: No overt anxiety or sadness  Vitals:   11/05/18 0954  BP: 128/73  Pulse: 77  Resp: 18  Temp: 97.6 F (36.4 C)   Body mass index is 34.35 kg/m. Physical Exam  GENERAL APPEARANCE: Alert, conversant, No acute distress  SKIN: No diaphoresis rash HEENT: Unremarkable RESPIRATORY: Breathing is even, unlabored.  CARDIOVASCULAR:  No peripheral edema  GASTROINTESTINAL: Abdomen is not distended w/ normal bowel sounds.  GENITOURINARY: Bladder not distended  MUSCULOSKELETAL: No abnormal joints or musculature NEUROLOGIC: Cranial nerves 2-12 grossly intact. Moves all extremities PSYCHIATRIC: Mood and affect appropriate to situation, no behavioral issues  Patient Active Problem List   Diagnosis Date Noted  . Major depressive disorder, single episode, unspecified 06/20/2017  . Peripheral vascular disease, unspecified ([place]) 04/05/2017  . Vascular dementia without behavioral disturbance ([place]) 02/06/2017  . CKD (chronic kidney disease) stage 3, GFR 30-59 ml/min (HCC) 04/14/2016  . Hyperlipidemia 03/17/2016  . Fever 05/10/2014  . Nausea with vomiting 03/14/2014  . Mouth pain 11/17/2013  . Anxiety state 02/25/2013   . Lethargy 02/25/2013  . Osteoarthritis 02/04/2013  . Edema 02/04/2013  . Other convulsions 11/27/2012  . Unspecified constipation 11/27/2012  . History of pulmonary embolism 10/12/2012  . Long term current use of anticoagulant therapy 10/12/2012  . E. coli UTI 10/12/2012  . Essential hypertension, benign 10/12/2012  . Depression 10/12/2012  . Hypothyroidism 10/12/2012  . Dementia, multi-infarct ([place]) 10/12/2012  . GERD (gastroesophageal reflux disease) 10/12/2012  . Psychosis ([place]) 10/12/2012  . Hypokalemia 10/12/2012  . Hyponatremia 10/12/2012    CMP     Component Value Date/Time   NA 135 (A) 09/21/2018   NA 135 (A) 09/21/2018   NA 139 10/14/2017   K 4.4 09/21/2018   K 4.4 09/21/2018   K 4.4 10/14/2017   CL 98 08/28/2010 1845   CO2 31 08/28/2010 1845   GLUCOSE 89 08/28/2010 1845   BUN 17 09/21/2018   BUN 17 09/21/2018   CREATININE 0.7 09/21/2018   CREATININE 0.7 09/21/2018   CREATININE 0.84 10/14/2017   CALCIUM 9.1 10/14/2017   PROT 6 10/14/2017  ALBUMIN 4 10/14/2017   AST 22 09/21/2018   AST 22 09/21/2018   AST 19 10/14/2017   ALT 11 09/21/2018   ALT 11 09/21/2018   ALT 11 10/14/2017   ALKPHOS 78 09/21/2018   ALKPHOS 78 09/21/2018   ALKPHOS 87 10/14/2017   BILITOT 0.2 10/14/2017   GFRNONAA 54 (L) 08/28/2010 1845   GFRAA  08/28/2010 1845    >60        The eGFR has been calculated using the MDRD equation. This calculation has not been validated in all clinical situations. eGFR's persistently <60 mL/min signify possible Chronic Kidney Disease.   Recent Labs    05/06/18 05/11/18 09/21/18  NA 136* 141 135*  135*  K 4.1 4.2 4.4  4.4  BUN _0 CREATININE 0.8 0.7 0.7  0.7   Recent Labs    05/06/18 05/11/18 09/21/18  AST 18 5* 22  22  ALT 9 5* 11  11  ALKPHOS 82 66 78  78   Recent Labs    09/21/18  WBC 5.1  HGB 13.0  HCT 38  PLT 227   Recent Labs    05/06/18 05/11/18 09/21/18  CHOL 191 123 187  LDLCALC 109 59 103  TRIG 49  107 41   No results found for: [facility] Lab Results  Component Value Date   TSH 1.41 09/21/2018   Lab Results  Component Value Date   HGBA1C 5.3 10/14/2017   Lab Results  Component Value Date   CHOL 187 09/21/2018   HDL 75 (A) 09/21/2018   LDLCALC 103 09/21/2018   TRIG 41 09/21/2018    Significant Diagnostic Results in last 30 days:  No results found.  Assessment and Plan  Essential hypertension, benign Controlled; continue lisinopril 5 mg daily  Dementia, multi-infarct Mild to moderate, continues without declines; continue Namenda 10 mg twice daily  Peripheral vascular disease, unspecified (HCC) No reported wounds or skin problems; continue Xarelto 20 mg daily     Hartley Urton D. Tricia Coil, MD

## 2018-11-08 ENCOUNTER — Encounter: Payer: Self-pay | Admitting: Internal Medicine

## 2018-11-08 NOTE — Assessment & Plan Note (Signed)
Mild to moderate, continues without declines; continue Namenda 10 mg twice daily

## 2018-11-08 NOTE — Assessment & Plan Note (Signed)
Controlled  - continue lisinopril 5 mg daily

## 2018-11-08 NOTE — Assessment & Plan Note (Signed)
No reported wounds or skin problems; continue Xarelto 20 mg daily

## 2018-12-01 ENCOUNTER — Encounter: Payer: Self-pay | Admitting: Internal Medicine

## 2018-12-01 ENCOUNTER — Non-Acute Institutional Stay (SKILLED_NURSING_FACILITY): Payer: Medicare Other | Admitting: Internal Medicine

## 2018-12-01 DIAGNOSIS — F0150 Vascular dementia without behavioral disturbance: Secondary | ICD-10-CM | POA: Diagnosis not present

## 2018-12-01 DIAGNOSIS — E034 Atrophy of thyroid (acquired): Secondary | ICD-10-CM

## 2018-12-01 DIAGNOSIS — K219 Gastro-esophageal reflux disease without esophagitis: Secondary | ICD-10-CM | POA: Diagnosis not present

## 2018-12-01 NOTE — Progress Notes (Signed)
Location:  [facility] [place] Room Number: 417-W Place of Service:  SNF ([id number])  [name], MD  Patient Care Team: [name], MD as PCP - General (Internal Medicine)  Extended Emergency Contact Information Primary Emergency Contact: Culclasure,[name]  [place] Phone: [phone] Relation: Other    Allergies: Ciprofloxacin hcl; Codeine; Duloxetine hcl; Hctz [hydrochlorothiazide]; Metaxalone; Morphine and related; Olmesartan; Penicillins; Quetiapine fumarate; and Zofran [ondansetron hcl]  Chief Complaint  Patient presents with  . Medical Management of Chronic Issues    Routine [facility] visit    HPI: Patient is 85 y.o. female who is being seen for routine issues of GERD, hypothyroidism, and dementia.  Past Medical History:  Diagnosis Date  . Anxiety state 02/25/2013  . Chronic kidney disease, stage III (moderate) (HCC)   . CKD (chronic kidney disease) stage 3, GFR 30-59 ml/min (HCC) 04/14/2016  . Dementia, multi-infarct ([place]) 10/12/2012  . Depression 10/12/2012  . Gastroesophageal reflux disease without esophagitis   . Generalized anxiety disorder   . GERD (gastroesophageal reflux disease) 10/12/2012  . History of falling   . History of pulmonary embolism 10/12/2012  . Hyperlipidemia   . Hypertension   . Hypo-osmolality and hyponatremia   . Hypokalemia   . Hypothyroid   . Long term current use of anticoagulant therapy 10/12/2012  . Major depressive disorder, single episode, unspecified   . OA (osteoarthritis)   . Old myocardial infarction   . Osteoarthritis 02/04/2013  . PE (pulmonary embolism)   . Peripheral vascular disease, unspecified ([place])   . Vascular dementia without behavioral disturbance [facility])     Past Surgical History:  Procedure Laterality Date  . JOINT REPLACEMENT     B knees    Allergies as of 12/01/2018      Reactions   Ciprofloxacin Hcl    Codeine    Duloxetine Hcl    Hctz  [hydrochlorothiazide]    Metaxalone    Morphine And Related    Olmesartan    Penicillins    Quetiapine Fumarate    Zofran [ondansetron Hcl]       Medication List       Accurate as of Dec 01, 2018 11:59 PM. If you have any questions, ask your nurse or doctor.        ARTIFICIAL TEARS OP Apply 1 drop to eye daily as needed.   calcium-vitamin D 500-200 MG-UNIT tablet Commonly known as:  OSCAL WITH D Take 1 tablet by mouth daily with breakfast.   Daily Vite Tabs Take 1 tablet by mouth daily.   docusate sodium 100 MG capsule Commonly known as:  COLACE Take 100 mg by mouth 2 (two) times daily. Hold for loose stool   famotidine 20 MG tablet Commonly known as:  PEPCID Take 20 mg by mouth daily. For reflux   fish oil-omega-3 fatty acids 1000 MG capsule Take 1 g by mouth 2 (two) times daily. For hyperlipidemia   levothyroxine 75 MCG tablet Commonly known as:  SYNTHROID Take 75 mcg by mouth daily. For hypothyroidism   lisinopril 5 MG tablet Commonly known as:  ZESTRIL Take 5 mg by mouth daily.   memantine 10 MG tablet Commonly known as:  NAMENDA Take 10 mg by mouth 2 (two) times daily. For dementia   polyethylene glycol 17 g packet Commonly known as:  MIRALAX / GLYCOLAX Take 17 g by mouth daily. HOLD FOR LOOSE STOOLS   PreviDent 5000 Plus 1.1 % Crea dental cream  Generic drug:  sodium fluoride Place 1 application onto teeth every evening. BRUSH TEETH WITH TOOTHPASTE AFTER PM MOUTH CARE. SPIT OUT EXCESS AND DO NOT RINSE   rivaroxaban 20 MG Tabs tablet Commonly known as:  XARELTO Take 20 mg by mouth daily with supper.   sodium chloride 1 g tablet Take 1 g by mouth 3 (three) times daily with meals. For sodium supplement   Tylenol 8 Hour Arthritis Pain 650 MG CR tablet Generic drug:  acetaminophen Take 650 mg by mouth every 12 (twelve) hours. For arthritis   venlafaxine XR 75 MG 24 hr capsule Commonly known as:  EFFEXOR-XR Take 75 mg by mouth daily with breakfast.        No orders of the defined types were placed in this encounter.   Immunization History  Administered Date(s) Administered  . Influenza-Unspecified 04/26/2013, 04/21/2015, 05/28/2016, 05/29/2017  . Pneumococcal Conjugate-13 07/06/2017    Social History   Tobacco Use  . Smoking status: Never Smoker  . Smokeless tobacco: Never Used  Substance Use Topics  . Alcohol use: No    Review of Systems  DATA OBTAINED: from patient GENERAL:  no fevers, fatigue, appetite changes SKIN: No itching, rash HEENT: No complaint RESPIRATORY: No cough, wheezing, SOB CARDIAC: No chest pain, palpitations, lower extremity edema  GI: No abdominal pain, No N/V/D or constipation, No heartburn or reflux  GU: No dysuria, frequency or urgency, or incontinence  MUSCULOSKELETAL: No unrelieved bone/joint pain NEUROLOGIC: No headache, dizziness  PSYCHIATRIC: No overt anxiety or sadness  Vitals:   12/01/18 1215  BP: 123/76  Pulse: 73  Resp: 18  Temp: (!) 97.4 F (36.3 C)   Body mass index is 34.02 kg/m. Physical Exam  GENERAL APPEARANCE: Alert, conversant, No acute distress  SKIN: No diaphoresis rash HEENT: Unremarkable RESPIRATORY: Breathing is even, unlabored. Lung sounds are clear   CARDIOVASCULAR: Heart RRR no murmurs, rubs or gallops. No peripheral edema  GASTROINTESTINAL: Abdomen is soft, non-tender, not distended w/ normal bowel sounds.  GENITOURINARY: Bladder non tender, not distended  MUSCULOSKELETAL: No abnormal joints or musculature NEUROLOGIC: Cranial nerves 2-12 grossly intact. Moves all extremities PSYCHIATRIC: Mood and affect appropriate to situation, no behavioral issues  Patient Active Problem List   Diagnosis Date Noted  . Major depressive disorder, single episode, unspecified 06/20/2017  . Peripheral vascular disease, unspecified ([place]) 04/05/2017  . Vascular dementia without behavioral disturbance ([place]) 02/06/2017  . CKD (chronic kidney disease) stage 3, GFR 30-59  ml/min (HCC) 04/14/2016  . Hyperlipidemia 03/17/2016  . Fever 05/10/2014  . Nausea with vomiting 03/14/2014  . Mouth pain 11/17/2013  . Anxiety state 02/25/2013  . Lethargy 02/25/2013  . Osteoarthritis 02/04/2013  . Edema 02/04/2013  . Other convulsions 11/27/2012  . Unspecified constipation 11/27/2012  . History of pulmonary embolism 10/12/2012  . Long term current use of anticoagulant therapy 10/12/2012  . E. coli UTI 10/12/2012  . Essential hypertension, benign 10/12/2012  . Depression 10/12/2012  . Hypothyroidism 10/12/2012  . Dementia, multi-infarct ([place]) 10/12/2012  . GERD (gastroesophageal reflux disease) 10/12/2012  . Psychosis ([place]) 10/12/2012  . Hypokalemia 10/12/2012  . Hyponatremia 10/12/2012    CMP     Component Value Date/Time   NA 135 (A) 09/21/2018   NA 135 (A) 09/21/2018   NA 139 10/14/2017   K 4.4 09/21/2018   K 4.4 09/21/2018   K 4.4 10/14/2017   CL 98 08/28/2010 1845   CO2 31 08/28/2010 1845   GLUCOSE 89 08/28/2010 1845   BUN 17 09/21/2018  BUN 17 09/21/2018   CREATININE 0.7 09/21/2018   CREATININE 0.7 09/21/2018   CREATININE 0.84 10/14/2017   CALCIUM 9.1 10/14/2017   PROT 6 10/14/2017   ALBUMIN 4 10/14/2017   AST 22 09/21/2018   AST 22 09/21/2018   AST 19 10/14/2017   ALT 11 09/21/2018   ALT 11 09/21/2018   ALT 11 10/14/2017   ALKPHOS 78 09/21/2018   ALKPHOS 78 09/21/2018   ALKPHOS 87 10/14/2017   BILITOT 0.2 10/14/2017   GFRNONAA 54 (L) 08/28/2010 1845   GFRAA  08/28/2010 1845    >60        The eGFR has been calculated using the MDRD equation. This calculation has not been validated in all clinical situations. eGFR's persistently <60 mL/min signify possible Chronic Kidney Disease.   Recent Labs    05/06/18 05/11/18 09/21/18  NA 136* 141 135*  135*  K 4.1 4.2 4.4  4.4  BUN _0 CREATININE 0.8 0.7 0.7  0.7   Recent Labs    05/06/18 05/11/18 09/21/18  AST 18 5* 22  22  ALT 9 5* 11  11  ALKPHOS 82 66 78  78    Recent Labs    09/21/18  WBC 5.1  HGB 13.0  HCT 38  PLT 227   Recent Labs    05/06/18 05/11/18 09/21/18  CHOL 191 123 187  LDLCALC 109 59 103  TRIG 49 107 41   No results found for: [facility] Lab Results  Component Value Date   TSH 1.41 09/21/2018   Lab Results  Component Value Date   HGBA1C 5.3 10/14/2017   Lab Results  Component Value Date   CHOL 187 09/21/2018   HDL 75 (A) 09/21/2018   LDLCALC 103 09/21/2018   TRIG 41 09/21/2018    Significant Diagnostic Results in last 30 days:  No results found.  Assessment and Plan  GERD (gastroesophageal reflux disease) No reports of problems; continue Pepcid 20 mg p.o. daily  Hypothyroidism TSH normal; continue Synthroid 75 mcg daily  Vascular dementia without behavioral disturbance Stable with no declines; continue Namenda 10 mg twice daily    Anne D. [name], MD

## 2018-12-03 ENCOUNTER — Encounter: Payer: Self-pay | Admitting: Internal Medicine

## 2018-12-03 NOTE — Assessment & Plan Note (Signed)
Stable with no declines; continue Namenda 10 mg twice daily

## 2018-12-03 NOTE — Assessment & Plan Note (Signed)
No reports of problems; continue Pepcid 20 mg p.o. daily

## 2018-12-03 NOTE — Assessment & Plan Note (Signed)
TSH normal; continue Synthroid 75 mcg daily

## 2018-12-31 ENCOUNTER — Non-Acute Institutional Stay (SKILLED_NURSING_FACILITY): Payer: Medicare Other | Admitting: Internal Medicine

## 2018-12-31 ENCOUNTER — Encounter: Payer: Self-pay | Admitting: Internal Medicine

## 2018-12-31 DIAGNOSIS — M159 Polyosteoarthritis, unspecified: Secondary | ICD-10-CM

## 2018-12-31 DIAGNOSIS — M150 Primary generalized (osteo)arthritis: Secondary | ICD-10-CM | POA: Diagnosis not present

## 2018-12-31 DIAGNOSIS — Z86711 Personal history of pulmonary embolism: Secondary | ICD-10-CM | POA: Diagnosis not present

## 2018-12-31 DIAGNOSIS — F32A Depression, unspecified: Secondary | ICD-10-CM

## 2018-12-31 DIAGNOSIS — F329 Major depressive disorder, single episode, unspecified: Secondary | ICD-10-CM | POA: Diagnosis not present

## 2018-12-31 NOTE — Progress Notes (Signed)
Location:  [facility] [place] Room Number: 417-W Place of Service:  SNF (31)  [name], MD  Patient Care Team: [name], MD as PCP - General (Internal Medicine)  Extended Emergency Contact Information Primary Emergency Contact: [place] Phone: [phone] Relation: Other   Chief Complaint  Patient presents with  . Medical Management of Chronic Issues    Routine visit of medical management  . Immunizations    T-Dap, PPSV-23    HPI:  Pt is a 85 y.o. female seen today for routine issues of osteoarthritis, history of PE, and depression..     Past Medical History:  Diagnosis Date  . Anxiety state 02/25/2013  . Chronic kidney disease, stage III (moderate) (HCC)   . CKD (chronic kidney disease) stage 3, GFR 30-59 ml/min (HCC) 04/14/2016  . Dementia, multi-infarct ([place]) 10/12/2012  . Depression 10/12/2012  . Gastroesophageal reflux disease without esophagitis   . Generalized anxiety disorder   . GERD (gastroesophageal reflux disease) 10/12/2012  . History of falling   . History of pulmonary embolism 10/12/2012  . Hyperlipidemia   . Hypertension   . Hypo-osmolality and hyponatremia   . Hypokalemia   . Hypothyroid   . Long term current use of anticoagulant therapy 10/12/2012  . Major depressive disorder, single episode, unspecified   . OA (osteoarthritis)   . Old myocardial infarction   . Osteoarthritis 02/04/2013  . PE (pulmonary embolism)   . Peripheral vascular disease, unspecified ([place])   . Vascular dementia without behavioral disturbance [facility])    Past Surgical History:  Procedure Laterality Date  . JOINT REPLACEMENT     B knees    Allergies  Allergen Reactions  . Ciprofloxacin Hcl   . Codeine   . Duloxetine Hcl   . Hctz [Hydrochlorothiazide]   . Metaxalone   . Morphine And Related   . Olmesartan   . Penicillins   . Quetiapine Fumarate   . Zofran [Ondansetron Hcl]     Allergies  as of 12/31/2018      Reactions   Ciprofloxacin Hcl    Codeine    Duloxetine Hcl    Hctz [hydrochlorothiazide]    Metaxalone    Morphine And Related    Olmesartan    Penicillins    Quetiapine Fumarate    Zofran [ondansetron Hcl]       Medication List       Accurate as of December 31, 2018 11:59 PM. If you have any questions, ask your nurse or doctor.        STOP taking these medications   calcium-vitamin D 500-200 MG-UNIT tablet Commonly known as: OSCAL WITH D Stopped by: Inocencio Homes, MD     TAKE these medications   ARTIFICIAL TEARS OP Apply 1 drop to eye daily as needed.   Daily Vite Tabs Take 1 tablet by mouth daily.   docusate sodium 100 MG capsule Commonly known as: COLACE Take 100 mg by mouth 2 (two) times daily. Hold for loose stool   famotidine 20 MG tablet Commonly known as: PEPCID Take 20 mg by mouth daily. For reflux   fish oil-omega-3 fatty acids 1000 MG capsule Take 1 g by mouth 2 (two) times daily. For hyperlipidemia   levothyroxine 75 MCG tablet Commonly known as: SYNTHROID Take 75 mcg by mouth daily. For hypothyroidism   lisinopril 5 MG tablet Commonly known as: ZESTRIL Take 5 mg by mouth daily.   memantine 10 MG tablet  Commonly known as: NAMENDA Take 10 mg by mouth 2 (two) times daily. For dementia   Oyster Shell Calcium 500 MG Tabs TAKE 1 TABLET BY MOUTH ONCE DAILY FOR HYPOCALCEMIA   polyethylene glycol 17 g packet Commonly known as: MIRALAX / GLYCOLAX Take 17 g by mouth daily. HOLD FOR LOOSE STOOLS   PreviDent 5000 Plus 1.1 % Crea dental cream Generic drug: sodium fluoride Place 1 application onto teeth every evening. BRUSH TEETH WITH TOOTHPASTE AFTER PM MOUTH CARE. SPIT OUT EXCESS AND DO NOT RINSE   rivaroxaban 20 MG Tabs tablet Commonly known as: XARELTO Take 20 mg by mouth daily with supper.   sodium chloride 1 g tablet Take 1 g by mouth 3 (three) times daily with meals. For sodium supplement   Tylenol 8 Hour Arthritis Pain  650 MG CR tablet Generic drug: acetaminophen Take 650 mg by mouth every 12 (twelve) hours. For arthritis   venlafaxine XR 75 MG 24 hr capsule Commonly known as: EFFEXOR-XR Take 75 mg by mouth daily with breakfast.       Review of Systems  DATA OBTAINED: from patient GENERAL:  no fevers, fatigue, appetite changes SKIN: No itching, rash HEENT: No complaint RESPIRATORY: No cough, wheezing, SOB CARDIAC: No chest pain, palpitations, lower extremity edema  GI: No abdominal pain, No N/V/D or constipation, No heartburn or reflux  GU: No dysuria, frequency or urgency, or incontinence  MUSCULOSKELETAL: No unrelieved bone/joint pain NEUROLOGIC: No headache, dizziness  PSYCHIATRIC: No overt anxiety or sadness   Immunization History  Administered Date(s) Administered  . Influenza-Unspecified 04/26/2013, 04/21/2015, 05/28/2016, 05/29/2017  . Pneumococcal Conjugate-13 07/06/2017   Pertinent  Health Maintenance Due  Topic Date Due  . PNA vac Low Risk Adult (2 of 2 - PPSV23) 01/30/2019 (Originally 07/06/2018)  . INFLUENZA VACCINE  02/20/2019  . DEXA SCAN  Completed   Fall Risk  02/13/2018 02/12/2017  Falls in the past year? No No    Vitals:   12/31/18 1456  BP: 134/83  Pulse: 95  Resp: 18  Temp: 97.7 F (36.5 C)  TempSrc: Oral   There is no height or weight on file to calculate BMI.  Physical Exam  GENERAL APPEARANCE: Alert, conversant, No acute distress  SKIN: No diaphoresis rash HEENT: Unremarkable RESPIRATORY: Breathing is even, unlabored. Lung sounds are clear   CARDIOVASCULAR: Heart RRR no murmurs, rubs or gallops. No peripheral edema  GASTROINTESTINAL: Abdomen is soft, non-tender, not distended w/ normal bowel sounds.  GENITOURINARY: Bladder non tender, not distended  MUSCULOSKELETAL: No abnormal joints or musculature NEUROLOGIC: Cranial nerves 2-12 grossly intact. Moves all extremities PSYCHIATRIC: Mood and affect appropriate to situation, no behavioral issues   Labs reviewed: Recent Labs    05/06/18 05/11/18 09/21/18  NA 136* 141 135*  135*  K 4.1 4.2 4.4  4.4  BUN 18 9 17  17   CREATININE 0.8 0.7 0.7  0.7   Recent Labs    05/06/18 05/11/18 09/21/18  AST 18 5* 22  22  ALT 9 5* 11  11  ALKPHOS 82 66 78  78   Recent Labs    09/21/18  WBC 5.1  HGB 13.0  HCT 38  PLT 227   Recent Labs    05/06/18 05/11/18 09/21/18  CHOL 191 123 187  LDLCALC 109 59 103  TRIG 49 107 41   No results found for: [facility] Lab Results  Component Value Date   TSH 1.41 09/21/2018   Lab Results  Component Value Date   HGBA1C  5.3 10/14/2017   Lab Results  Component Value Date   CHOL 187 09/21/2018   HDL 75 (A) 09/21/2018   LDLCALC 103 09/21/2018   TRIG 41 09/21/2018    Significant Diagnostic Results in last 30 days:  No results found.  Assessment/Plan Osteoarthritis No reports of complaints; continue her arthritis Tylenol 650 mg every 12  History of pulmonary embolism Stable and chronic; continue Xarelto 20 mg daily  Depression Appears controlled; continue Effexor 75 mg daily    Anne D. Alexander MD.

## 2019-01-03 ENCOUNTER — Encounter: Payer: Self-pay | Admitting: Internal Medicine

## 2019-01-03 NOTE — Assessment & Plan Note (Signed)
Appears controlled; continue Effexor 75 mg daily

## 2019-01-03 NOTE — Assessment & Plan Note (Signed)
No reports of complaints; continue her arthritis Tylenol 650 mg every 12

## 2019-01-03 NOTE — Assessment & Plan Note (Signed)
Stable and chronic; continue Xarelto 20 mg daily

## 2019-01-05 LAB — BASIC METABOLIC PANEL
BUN: 18 (ref 4–21)
Creatinine: 0.8 (ref 0.5–1.1)
Glucose: 94
Potassium: 4.6 (ref 3.4–5.3)
Sodium: 137 (ref 137–147)

## 2019-01-06 LAB — BASIC METABOLIC PANEL
BUN: 21 (ref 4–21)
Creatinine: 0.8 (ref 0.5–1.1)
Glucose: 97
Potassium: 4.6 (ref 3.4–5.3)
Sodium: 133 — AB (ref 137–147)

## 2019-01-13 LAB — BASIC METABOLIC PANEL
BUN: 19 (ref 4–21)
Creatinine: 0.8 (ref 0.5–1.1)
Glucose: 91
Potassium: 4.1 (ref 3.4–5.3)
Sodium: 135 — AB (ref 137–147)

## 2019-02-12 ENCOUNTER — Encounter: Payer: Self-pay | Admitting: Internal Medicine

## 2019-02-12 ENCOUNTER — Non-Acute Institutional Stay (SKILLED_NURSING_FACILITY): Payer: Medicare Other | Admitting: Internal Medicine

## 2019-02-12 DIAGNOSIS — I10 Essential (primary) hypertension: Secondary | ICD-10-CM | POA: Diagnosis not present

## 2019-02-12 DIAGNOSIS — I739 Peripheral vascular disease, unspecified: Secondary | ICD-10-CM

## 2019-02-12 DIAGNOSIS — F0150 Vascular dementia without behavioral disturbance: Secondary | ICD-10-CM | POA: Diagnosis not present

## 2019-02-12 NOTE — Progress Notes (Signed)
Location:  [facility] [place] Room Number: 422-D Place of Service:  SNF (31)  [name], MD  Patient Care Team: [name], MD as PCP - General (Internal Medicine)  Extended Emergency Contact Information Primary Emergency Contact: [place],[name]  [place] Phone: [phone] Relation: Other    Allergies: Ciprofloxacin hcl, Codeine, Duloxetine hcl, Hctz [hydrochlorothiazide], Metaxalone, Morphine and related, Olmesartan, Penicillins, Quetiapine fumarate, and Zofran [name] hcl]  Chief Complaint  Patient presents with  . Medical Management of Chronic Issues    Routine [facility] SNF visit  . Immunizations    PPSV-23    HPI: Patient is an 85 y.o. female who is being seen for routine issues of peripheral vascular disease, hypertension, and multi-infarct dementia.  Past Medical History:  Diagnosis Date  . Anxiety state 02/25/2013  . Chronic kidney disease, stage III (moderate) (HCC)   . CKD (chronic kidney disease) stage 3, GFR 30-59 ml/min (HCC) 04/14/2016  . Dementia, multi-infarct ([place]) 10/12/2012  . Depression 10/12/2012  . Gastroesophageal reflux disease without esophagitis   . Generalized anxiety disorder   . GERD (gastroesophageal reflux disease) 10/12/2012  . History of falling   . History of pulmonary embolism 10/12/2012  . Hyperlipidemia   . Hypertension   . Hypo-osmolality and hyponatremia   . Hypokalemia   . Hypothyroid   . Long term current use of anticoagulant therapy 10/12/2012  . Major depressive disorder, single episode, unspecified   . OA (osteoarthritis)   . Old myocardial infarction   . Osteoarthritis 02/04/2013  . PE (pulmonary embolism)   . Peripheral vascular disease, unspecified ([place])   . Vascular dementia without behavioral disturbance [facility])     Past Surgical History:  Procedure Laterality Date  . JOINT REPLACEMENT     B knees    Allergies as of 02/12/2019      Reactions   Ciprofloxacin Hcl    Codeine    Duloxetine Hcl    Hctz [hydrochlorothiazide]    Metaxalone    Morphine And Related    Olmesartan    Penicillins    Quetiapine Fumarate    Zofran [ondansetron Hcl]       Medication List       Accurate as of February 12, 2019 11:59 PM. If you have any questions, ask your nurse or doctor.        ARTIFICIAL TEARS OP Apply 1 drop to eye daily as needed.   Daily Vite Tabs Take 1 tablet by mouth daily.   docusate sodium 100 MG capsule Commonly known as: COLACE Take 100 mg by mouth 2 (two) times daily. Hold for loose stool   famotidine 20 MG tablet Commonly known as: PEPCID Take 20 mg by mouth daily. For reflux   fish oil-omega-3 fatty acids 1000 MG capsule Take 1 g by mouth 2 (two) times daily. For hyperlipidemia   levothyroxine 75 MCG tablet Commonly known as: SYNTHROID Take 75 mcg by mouth daily. For hypothyroidism   lisinopril 5 MG tablet Commonly known as: ZESTRIL Take 5 mg by mouth daily.   memantine 10 MG tablet Commonly known as: NAMENDA Take 10 mg by mouth 2 (two) times daily. For dementia   Oyster Shell Calcium 500 MG Tabs TAKE 1 TABLET BY MOUTH ONCE DAILY FOR HYPOCALCEMIA   polyethylene glycol 17 g packet Commonly known as: MIRALAX / GLYCOLAX Take 17 g by mouth daily. HOLD FOR LOOSE STOOLS   PreviDent 5000 Plus 1.1 % Crea dental cream Generic  drug: sodium fluoride Place 1 application onto teeth every evening. BRUSH TEETH WITH TOOTHPASTE AFTER PM MOUTH CARE. SPIT OUT EXCESS AND DO NOT RINSE   rivaroxaban 20 MG Tabs tablet Commonly known as: XARELTO Take 20 mg by mouth daily with supper.   sodium chloride 1 g tablet Take 1 g by mouth 3 (three) times daily with meals. For sodium supplement   Tylenol 8 Hour Arthritis Pain 650 MG CR tablet Generic drug: acetaminophen Take 650 mg by mouth every 12 (twelve) hours. For arthritis   venlafaxine XR 75 MG 24 hr capsule Commonly known as: EFFEXOR-XR Take 75 mg by mouth daily  with breakfast.   Vitamin D3 1.25 MG (50000 UT) Caps Take 1 capsule by mouth once a week.       No orders of the defined types were placed in this encounter.   Immunization History  Administered Date(s) Administered  . Influenza-Unspecified 04/26/2013, 05/28/2016, 05/29/2017, 04/11/2018  . Pneumococcal Conjugate-13 07/06/2017    Social History   Tobacco Use  . Smoking status: Never Smoker  . Smokeless tobacco: Never Used  Substance Use Topics  . Alcohol use: No    Review of Systems  DATA OBTAINED: from patient, nurse GENERAL:  no fevers, fatigue, appetite changes SKIN: No itching, rash HEENT: No complaint RESPIRATORY: No cough, wheezing, SOB CARDIAC: No chest pain, palpitations, lower extremity edema  GI: No abdominal pain, No N/V/D or constipation, No heartburn or reflux  GU: No dysuria, frequency or urgency, or incontinence  MUSCULOSKELETAL: No unrelieved bone/joint pain NEUROLOGIC: No headache, dizziness  PSYCHIATRIC: No overt anxiety or sadness  Vitals:   02/12/19 1141  BP: 109/61  Pulse: 83  Resp: 19  Temp: (!) 96.8 F (36 C)  SpO2: 97%   Body mass index is 32.57 kg/m. Physical Exam  GENERAL APPEARANCE: Alert, conversant, No acute distress  SKIN: No diaphoresis rash HEENT: Unremarkable RESPIRATORY: Breathing is even, unlabored. Lung sounds are clear   CARDIOVASCULAR: Heart RRR no murmurs, rubs or gallops. No peripheral edema  GASTROINTESTINAL: Abdomen is soft, non-tender, not distended w/ normal bowel sounds.  GENITOURINARY: Bladder non tender, not distended  MUSCULOSKELETAL: No abnormal joints or musculature NEUROLOGIC: Cranial nerves 2-12 grossly intact. Moves all extremities PSYCHIATRIC: Mood and affect appropriate to situation with dementia, no behavioral issues  Patient Active Problem List   Diagnosis Date Noted  . Major depressive disorder, single episode, unspecified 06/20/2017  . Peripheral vascular disease, unspecified ([place]) 04/05/2017   . Vascular dementia without behavioral disturbance ([place]) 02/06/2017  . CKD (chronic kidney disease) stage 3, GFR 30-59 ml/min (HCC) 04/14/2016  . Hyperlipidemia 03/17/2016  . Fever 05/10/2014  . Nausea with vomiting 03/14/2014  . Mouth pain 11/17/2013  . Anxiety state 02/25/2013  . Lethargy 02/25/2013  . Osteoarthritis 02/04/2013  . Edema 02/04/2013  . Other convulsions 11/27/2012  . Unspecified constipation 11/27/2012  . History of pulmonary embolism 10/12/2012  . Long term current use of anticoagulant therapy 10/12/2012  . E. coli UTI 10/12/2012  . Essential hypertension, benign 10/12/2012  . Depression 10/12/2012  . Hypothyroidism 10/12/2012  . Dementia, multi-infarct ([place]) 10/12/2012  . GERD (gastroesophageal reflux disease) 10/12/2012  . Psychosis ([place]) 10/12/2012  . Hypokalemia 10/12/2012  . Hyponatremia 10/12/2012    CMP     Component Value Date/Time   NA 135 (A) 09/21/2018   NA 135 (A) 09/21/2018   NA 139 10/14/2017   K 4.4 09/21/2018   K 4.4 09/21/2018   K 4.4 10/14/2017   CL 98 08/28/2010  1845   CO2 31 08/28/2010 1845   GLUCOSE 89 08/28/2010 1845   BUN 17 09/21/2018   BUN 17 09/21/2018   CREATININE 0.7 09/21/2018   CREATININE 0.7 09/21/2018   CREATININE 0.84 10/14/2017   CALCIUM 9.1 10/14/2017   PROT 6 10/14/2017   ALBUMIN 4 10/14/2017   AST 22 09/21/2018   AST 22 09/21/2018   AST 19 10/14/2017   ALT 11 09/21/2018   ALT 11 09/21/2018   ALT 11 10/14/2017   ALKPHOS 78 09/21/2018   ALKPHOS 78 09/21/2018   ALKPHOS 87 10/14/2017   BILITOT 0.2 10/14/2017   GFRNONAA 54 (L) 08/28/2010 1845   GFRAA  08/28/2010 1845    >60        The eGFR has been calculated using the MDRD equation. This calculation has not been validated in all clinical situations. eGFR's persistently <60 mL/min signify possible Chronic Kidney Disease.   Recent Labs    05/06/18 05/11/18 09/21/18  NA 136* 141 135*  135*  K 4.1 4.2 4.4  4.4  BUN _0 CREATININE 0.8  0.7 0.7  0.7   Recent Labs    05/06/18 05/11/18 09/21/18  AST 18 5* 22  22  ALT 9 5* 11  11  ALKPHOS 82 66 78  78   Recent Labs    09/21/18  WBC 5.1  HGB 13.0  HCT 38  PLT 227   Recent Labs    05/06/18 05/11/18 09/21/18  CHOL 191 123 187  LDLCALC 109 59 103  TRIG 49 107 41   No results found for: [facility] Lab Results  Component Value Date   TSH 1.41 09/21/2018   Lab Results  Component Value Date   HGBA1C 5.3 10/14/2017   Lab Results  Component Value Date   CHOL 187 09/21/2018   HDL 75 (A) 09/21/2018   LDLCALC 103 09/21/2018   TRIG 41 09/21/2018    Significant Diagnostic Results in last 30 days:  No results found.  Assessment and Plan  Peripheral vascular disease, unspecified ([place]) No reported wounds or skin problems; continue Xarelto 20 mg daily; patient is on fish oil  Essential hypertension, benign Controlled; continue lisinopril 5 mg daily  Dementia, multi-infarct Mild to moderate; patient does have some perseveration; continue Namenda 10 mg twice daily    [name], MD

## 2019-02-13 ENCOUNTER — Encounter: Payer: Self-pay | Admitting: Internal Medicine

## 2019-02-13 NOTE — Assessment & Plan Note (Signed)
Controlled  - continue lisinopril 5 mg daily

## 2019-02-13 NOTE — Assessment & Plan Note (Signed)
Mild to moderate; patient does have some perseveration; continue Namenda 10 mg twice daily

## 2019-02-13 NOTE — Assessment & Plan Note (Signed)
No reported wounds or skin problems; continue Xarelto 20 mg daily; patient is on fish oil

## 2019-03-01 ENCOUNTER — Non-Acute Institutional Stay (SKILLED_NURSING_FACILITY): Payer: Medicare Other | Admitting: Internal Medicine

## 2019-03-01 DIAGNOSIS — U071 COVID-19: Secondary | ICD-10-CM

## 2019-03-02 ENCOUNTER — Encounter: Payer: Self-pay | Admitting: Internal Medicine

## 2019-03-02 DIAGNOSIS — U071 COVID-19: Secondary | ICD-10-CM | POA: Insufficient documentation

## 2019-03-02 NOTE — Progress Notes (Signed)
Location:  [facility] Service:   SNF  [name], MD  Patient Care Team: [name], MD as PCP - General (Internal Medicine)  Extended Emergency Contact Information Primary Emergency Contact: Culclasure,[name]  [place] Phone: [phone] Relation: Other    Allergies: Ciprofloxacin hcl, Codeine, Duloxetine hcl, Hctz [hydrochlorothiazide], Metaxalone, Morphine and related, Olmesartan, Penicillins, Quetiapine fumarate, and Zofran [name] hcl]  Chief Complaint  Patient presents with  . Acute Visit    HPI: Patient is 85 y.o. female who is being seen because she has COVID-19 positive from a test taken by the health department on 7/21.  Patient has been asymptomatic.  Patient has had no fever chills nausea vomiting diarrhea cough chest pain shortness of breath, sinus symptoms or any other symptoms.  Past Medical History:  Diagnosis Date  . Anxiety state 02/25/2013  . Chronic kidney disease, stage III (moderate) (HCC)   . CKD (chronic kidney disease) stage 3, GFR 30-59 ml/min (HCC) 04/14/2016  . Dementia, multi-infarct ([place]) 10/12/2012  . Depression 10/12/2012  . Gastroesophageal reflux disease without esophagitis   . Generalized anxiety disorder   . GERD (gastroesophageal reflux disease) 10/12/2012  . History of falling   . History of pulmonary embolism 10/12/2012  . Hyperlipidemia   . Hypertension   . Hypo-osmolality and hyponatremia   . Hypokalemia   . Hypothyroid   . Long term current use of anticoagulant therapy 10/12/2012  . Major depressive disorder, single episode, unspecified   . OA (osteoarthritis)   . Old myocardial infarction   . Osteoarthritis 02/04/2013  . PE (pulmonary embolism)   . Peripheral vascular disease, unspecified ([place])   . Vascular dementia without behavioral disturbance [facility])     Past Surgical History:  Procedure Laterality Date  . JOINT REPLACEMENT     B knees     Allergies as of 03/01/2019      Reactions   Ciprofloxacin Hcl    Codeine    Duloxetine Hcl    Hctz [hydrochlorothiazide]    Metaxalone    Morphine And Related    Olmesartan    Penicillins    Quetiapine Fumarate    Zofran [ondansetron Hcl]       Medication List       Accurate as of March 01, 2019 11:59 PM. If you have any questions, ask your nurse or doctor.        ARTIFICIAL TEARS OP Apply 1 drop to eye daily as needed.   Daily Vite Tabs Take 1 tablet by mouth daily.   docusate sodium 100 MG capsule Commonly known as: COLACE Take 100 mg by mouth 2 (two) times daily. Hold for loose stool   famotidine 20 MG tablet Commonly known as: PEPCID Take 20 mg by mouth daily. For reflux   fish oil-omega-3 fatty acids 1000 MG capsule Take 1 g by mouth 2 (two) times daily. For hyperlipidemia   levothyroxine 75 MCG tablet Commonly known as: SYNTHROID Take 75 mcg by mouth daily. For hypothyroidism   lisinopril 5 MG tablet Commonly known as: ZESTRIL Take 5 mg by mouth daily.   memantine 10 MG tablet Commonly known as: NAMENDA Take 10 mg by mouth 2 (two) times daily. For dementia   Oyster Shell Calcium 500 MG Tabs TAKE 1 TABLET BY MOUTH ONCE DAILY FOR HYPOCALCEMIA   polyethylene glycol 17 g packet Commonly known as: MIRALAX / GLYCOLAX Take 17 g by mouth daily. HOLD FOR LOOSE STOOLS  PreviDent 5000 Plus 1.1 % Crea dental cream Generic drug: sodium fluoride Place 1 application onto teeth every evening. BRUSH TEETH WITH TOOTHPASTE AFTER PM MOUTH CARE. SPIT OUT EXCESS AND DO NOT RINSE   rivaroxaban 20 MG Tabs tablet Commonly known as: XARELTO Take 20 mg by mouth daily with supper.   sodium chloride 1 g tablet Take 1 g by mouth 3 (three) times daily with meals. For sodium supplement   Tylenol 8 Hour Arthritis Pain 650 MG CR tablet Generic drug: acetaminophen Take 650 mg by mouth every 12 (twelve) hours. For arthritis   venlafaxine XR 75 MG 24 hr capsule Commonly  known as: EFFEXOR-XR Take 75 mg by mouth daily with breakfast.   Vitamin D3 1.25 MG (50000 UT) Caps Take 1 capsule by mouth once a week.       No orders of the defined types were placed in this encounter.   Immunization History  Administered Date(s) Administered  . Influenza-Unspecified 04/26/2013, 05/28/2016, 05/29/2017, 04/11/2018  . Pneumococcal Conjugate-13 07/06/2017    Social History   Tobacco Use  . Smoking status: Never Smoker  . Smokeless tobacco: Never Used  Substance Use Topics  . Alcohol use: No    Review of Systems  DATA OBTAINED: from patient GENERAL:  no fevers, fatigue, appetite changes SKIN: No itching, rash HEENT: No complaint RESPIRATORY: No cough, wheezing, SOB CARDIAC: No chest pain, palpitations, lower extremity edema  GI: No abdominal pain, No N/V/D or constipation, No heartburn or reflux  GU: No dysuria, frequency or urgency, or incontinence  MUSCULOSKELETAL: No unrelieved bone/joint pain NEUROLOGIC: No headache, dizziness  PSYCHIATRIC: No overt anxiety or sadness  Vitals:   03/02/19 2237  BP: (!) 149/82  Pulse: 86  Resp: 20  Temp: 98.3 F (36.8 C)   There is no height or weight on file to calculate BMI. Physical Exam  GENERAL APPEARANCE: Alert, conversant, No acute distress  SKIN: No diaphoresis rash HEENT: Unremarkable RESPIRATORY: Breathing is even, unlabored. Lung sounds are clear   CARDIOVASCULAR: Heart RRR no murmurs, rubs or gallops. No peripheral edema  GASTROINTESTINAL: Abdomen is soft, non-tender, not distended w/ normal bowel sounds.  GENITOURINARY: Bladder non tender, not distended  MUSCULOSKELETAL: No abnormal joints or musculature NEUROLOGIC: Cranial nerves 2-12 grossly intact. Moves all extremities PSYCHIATRIC: Mood and affect appropriate to situation, no behavioral issues  Patient Active Problem List   Diagnosis Date Noted  . Major depressive disorder, single episode, unspecified 06/20/2017  . Peripheral  vascular disease, unspecified ([place]) 04/05/2017  . Vascular dementia without behavioral disturbance ([place]) 02/06/2017  . CKD (chronic kidney disease) stage 3, GFR 30-59 ml/min (HCC) 04/14/2016  . Hyperlipidemia 03/17/2016  . Fever 05/10/2014  . Nausea with vomiting 03/14/2014  . Mouth pain 11/17/2013  . Anxiety state 02/25/2013  . Lethargy 02/25/2013  . Osteoarthritis 02/04/2013  . Edema 02/04/2013  . Other convulsions 11/27/2012  . Unspecified constipation 11/27/2012  . History of pulmonary embolism 10/12/2012  . Long term current use of anticoagulant therapy 10/12/2012  . E. coli UTI 10/12/2012  . Essential hypertension, benign 10/12/2012  . Depression 10/12/2012  . Hypothyroidism 10/12/2012  . Dementia, multi-infarct ([place]) 10/12/2012  . GERD (gastroesophageal reflux disease) 10/12/2012  . Psychosis ([place]) 10/12/2012  . Hypokalemia 10/12/2012  . Hyponatremia 10/12/2012    CMP     Component Value Date/Time   NA 135 (A) 09/21/2018   NA 135 (A) 09/21/2018   NA 139 10/14/2017   K 4.4 09/21/2018   K 4.4 09/21/2018  K 4.4 10/14/2017   CL 98 08/28/2010 1845   CO2 31 08/28/2010 1845   GLUCOSE 89 08/28/2010 1845   BUN 17 09/21/2018   BUN 17 09/21/2018   CREATININE 0.7 09/21/2018   CREATININE 0.7 09/21/2018   CREATININE 0.84 10/14/2017   CALCIUM 9.1 10/14/2017   PROT 6 10/14/2017   ALBUMIN 4 10/14/2017   AST 22 09/21/2018   AST 22 09/21/2018   AST 19 10/14/2017   ALT 11 09/21/2018   ALT 11 09/21/2018   ALT 11 10/14/2017   ALKPHOS 78 09/21/2018   ALKPHOS 78 09/21/2018   ALKPHOS 87 10/14/2017   BILITOT 0.2 10/14/2017   GFRNONAA 54 (L) 08/28/2010 1845   GFRAA  08/28/2010 1845    >60        The eGFR has been calculated using the MDRD equation. This calculation has not been validated in all clinical situations. eGFR's persistently <60 mL/min signify possible Chronic Kidney Disease.   Recent Labs    05/06/18 05/11/18 09/21/18  NA 136* 141 135*  135*  K 4.1 4.2  4.4  4.4  BUN '18 9 17  17  ' CREATININE 0.8 0.7 0.7  0.7   Recent Labs    05/06/18 05/11/18 09/21/18  AST 18 5* 22  22  ALT 9 5* 11  11  ALKPHOS 82 66 78  78   Recent Labs    09/21/18  WBC 5.1  HGB 13.0  HCT 38  PLT 227   Recent Labs    05/06/18 05/11/18 09/21/18  CHOL 191 123 187  LDLCALC 109 59 103  TRIG 49 107 41   No results found for: [facility] Lab Results  Component Value Date   TSH 1.41 09/21/2018   Lab Results  Component Value Date   HGBA1C 5.3 10/14/2017   Lab Results  Component Value Date   CHOL 187 09/21/2018   HDL 75 (A) 09/21/2018   LDLCALC 103 09/21/2018   TRIG 41 09/21/2018    Significant Diagnostic Results in last 30 days:  No results found.  Assessment and Plan  COVID-19 positive- patient has been in and is asymptomatic; patient is passed her 14-day.  And per health department does not need to be retested; continue close observation     [name], MD

## 2019-03-08 ENCOUNTER — Encounter: Payer: Self-pay | Admitting: Internal Medicine

## 2019-03-08 ENCOUNTER — Non-Acute Institutional Stay (SKILLED_NURSING_FACILITY): Payer: Medicare Other | Admitting: Internal Medicine

## 2019-03-08 DIAGNOSIS — E034 Atrophy of thyroid (acquired): Secondary | ICD-10-CM | POA: Diagnosis not present

## 2019-03-08 DIAGNOSIS — K219 Gastro-esophageal reflux disease without esophagitis: Secondary | ICD-10-CM | POA: Diagnosis not present

## 2019-03-08 DIAGNOSIS — U071 COVID-19: Secondary | ICD-10-CM | POA: Diagnosis not present

## 2019-03-08 DIAGNOSIS — F0150 Vascular dementia without behavioral disturbance: Secondary | ICD-10-CM

## 2019-03-08 NOTE — Progress Notes (Signed)
Location:  [facility] [place] Room Number: 417-W Place of Service:  SNF (31)  Tricia Duos, MD  Patient Care Team: Tricia Duos, MD as PCP - General (Internal Medicine)  Extended Emergency Contact Information Primary Emergency Contact: Culclasure,[name]  [place] Phone: [phone] Relation: Other    Allergies: Ciprofloxacin hcl, Codeine, Duloxetine hcl, Hctz [hydrochlorothiazide], Metaxalone, Morphine and related, Olmesartan, Penicillins, Quetiapine fumarate, and Zofran [name] hcl]  Chief Complaint  Patient presents with  . Medical Management of Chronic Issues    Routine [facility] visit    HPI: Patient is an 85 y.o. female who is being seen for routine issues of GERD, hypothyroidism, dementia, and COVID-19 positivity.  Past Medical History:  Diagnosis Date  . Anxiety state 02/25/2013  . Chronic kidney disease, stage III (moderate) (HCC)   . CKD (chronic kidney disease) stage 3, GFR 30-59 ml/min (HCC) 04/14/2016  . Dementia, multi-infarct ([place]) 10/12/2012  . Depression 10/12/2012  . Gastroesophageal reflux disease without esophagitis   . Generalized anxiety disorder   . GERD (gastroesophageal reflux disease) 10/12/2012  . History of falling   . History of pulmonary embolism 10/12/2012  . Hyperlipidemia   . Hypertension   . Hypo-osmolality and hyponatremia   . Hypokalemia   . Hypothyroid   . Long term current use of anticoagulant therapy 10/12/2012  . Major depressive disorder, single episode, unspecified   . OA (osteoarthritis)   . Old myocardial infarction   . Osteoarthritis 02/04/2013  . PE (pulmonary embolism)   . Peripheral vascular disease, unspecified ([place])   . Vascular dementia without behavioral disturbance [facility])     Past Surgical History:  Procedure Laterality Date  . JOINT REPLACEMENT     B knees    Allergies as of 03/08/2019      Reactions   Ciprofloxacin Hcl    Codeine    Duloxetine Hcl     Hctz [hydrochlorothiazide]    Metaxalone    Morphine And Related    Olmesartan    Penicillins    Quetiapine Fumarate    Zofran [ondansetron Hcl]       Medication List       Accurate as of March 08, 2019 11:59 PM. If you have any questions, ask your nurse or doctor.        ARTIFICIAL TEARS OP Apply 1 drop to eye daily as needed.   Daily Vite Tabs Take 1 tablet by mouth daily.   docusate sodium 100 MG capsule Commonly known as: COLACE Take 100 mg by mouth 2 (two) times daily. Hold for loose stool   famotidine 20 MG tablet Commonly known as: PEPCID Take 20 mg by mouth daily.   fish oil-omega-3 fatty acids 1000 MG capsule Take 1 g by mouth 2 (two) times daily. For hyperlipidemia   levothyroxine 75 MCG tablet Commonly known as: SYNTHROID Take 75 mcg by mouth daily. For hypothyroidism   lisinopril 5 MG tablet Commonly known as: ZESTRIL Take 5 mg by mouth daily.   memantine 10 MG tablet Commonly known as: NAMENDA Take 10 mg by mouth 2 (two) times daily. For dementia   Oyster Shell Calcium 500 MG Tabs Take 1 tablet by mouth daily.   polyethylene glycol 17 g packet Commonly known as: MIRALAX / GLYCOLAX Take 17 g by mouth daily. HOLD FOR LOOSE STOOLS   PreviDent 5000 Plus 1.1 % Crea dental cream Generic drug: sodium fluoride Place 1 application onto teeth every evening. BRUSH TEETH  WITH TOOTHPASTE AFTER PM MOUTH CARE. SPIT OUT EXCESS AND DO NOT RINSE   rivaroxaban 20 MG Tabs tablet Commonly known as: XARELTO Take 20 mg by mouth daily with supper.   sodium chloride 1 g tablet Take 1 g by mouth 3 (three) times daily with meals. For sodium supplement   Tylenol 8 Hour Arthritis Pain 650 MG CR tablet Generic drug: acetaminophen Take 650 mg by mouth every 12 (twelve) hours. For arthritis   venlafaxine XR 75 MG 24 hr capsule Commonly known as: EFFEXOR-XR Take 75 mg by mouth daily with breakfast.   Vitamin D3 1.25 MG (50000 UT) Caps Take 1 capsule by mouth  once a week.       No orders of the defined types were placed in this encounter.   Immunization History  Administered Date(s) Administered  . Influenza-Unspecified 04/26/2013, 05/28/2016, 05/29/2017, 04/11/2018  . Pneumococcal Conjugate-13 07/06/2017    Social History   Tobacco Use  . Smoking status: Never Smoker  . Smokeless tobacco: Never Used  Substance Use Topics  . Alcohol use: No    Review of Systems  DATA OBTAINED: from patient GENERAL:  no fevers, fatigue, appetite changes SKIN: No itching, rash all personally this was her vision cabins like old-fashioned cabins and there is no HEENT: No complaint RESPIRATORY: No cough, wheezing, SOB CARDIAC: No chest pain, palpitations, lower extremity edema  GI: No abdominal pain, No N/V/D or constipation, No heartburn or reflux  GU: No dysuria, frequency or urgency, or incontinence  MUSCULOSKELETAL: No unrelieved bone/joint pain NEUROLOGIC: No headache, dizziness  PSYCHIATRIC: No overt anxiety or sadness  Vitals:   03/08/19 1142  BP: 113/69  Pulse: 68  Resp: 18  Temp: (!) 97 F (36.1 C)   Body mass index is 32.51 kg/m. Physical Exam  GENERAL APPEARANCE: Alert, conversant, No acute distress  SKIN: No diaphoresis rash HEENT: Unremarkable RESPIRATORY: Breathing is even, unlabored. Lung sounds are clear   CARDIOVASCULAR: Heart RRR no murmurs, rubs or gallops. No peripheral edema  GASTROINTESTINAL: Abdomen is soft, non-tender, not distended w/ normal bowel sounds.  GENITOURINARY: Bladder non tender, not distended  MUSCULOSKELETAL: No abnormal joints or musculature NEUROLOGIC: Cranial nerves 2-12 grossly intact. Moves all extremities PSYCHIATRIC: Mood and affect appropriate to situation with minimal dementia, no behavioral issues  Patient Active Problem List   Diagnosis Date Noted  . Real time reverse transcriptase PCR positive for COVID-19 virus 03/02/2019  . Major depressive disorder, single episode, unspecified  06/20/2017  . Peripheral vascular disease, unspecified ([place]) 04/05/2017  . Vascular dementia without behavioral disturbance ([place]) 02/06/2017  . CKD (chronic kidney disease) stage 3, GFR 30-59 ml/min (HCC) 04/14/2016  . Hyperlipidemia 03/17/2016  . Fever 05/10/2014  . Nausea with vomiting 03/14/2014  . Mouth pain 11/17/2013  . Anxiety state 02/25/2013  . Lethargy 02/25/2013  . Osteoarthritis 02/04/2013  . Edema 02/04/2013  . Other convulsions 11/27/2012  . Unspecified constipation 11/27/2012  . History of pulmonary embolism 10/12/2012  . Long term current use of anticoagulant therapy 10/12/2012  . E. coli UTI 10/12/2012  . Essential hypertension, benign 10/12/2012  . Depression 10/12/2012  . Hypothyroidism 10/12/2012  . Dementia, multi-infarct ([place]) 10/12/2012  . GERD (gastroesophageal reflux disease) 10/12/2012  . Psychosis ([place]) 10/12/2012  . Hypokalemia 10/12/2012  . Hyponatremia 10/12/2012    CMP     Component Value Date/Time   NA 135 (A) 01/13/2019   NA 139 10/14/2017   K 4.1 01/13/2019   K 4.4 10/14/2017   CL 98 08/28/2010  1845   CO2 31 08/28/2010 1845   GLUCOSE 89 08/28/2010 1845   BUN 19 01/13/2019   CREATININE 0.8 01/13/2019   CREATININE 0.84 10/14/2017   CALCIUM 9.1 10/14/2017   PROT 6 10/14/2017   ALBUMIN 4 10/14/2017   AST 22 09/21/2018   AST 22 09/21/2018   AST 19 10/14/2017   ALT 11 09/21/2018   ALT 11 09/21/2018   ALT 11 10/14/2017   ALKPHOS 78 09/21/2018   ALKPHOS 78 09/21/2018   ALKPHOS 87 10/14/2017   BILITOT 0.2 10/14/2017   GFRNONAA 54 (L) 08/28/2010 1845   GFRAA  08/28/2010 1845    >60        The eGFR has been calculated using the MDRD equation. This calculation has not been validated in all clinical situations. eGFR's persistently <60 mL/min signify possible Chronic Kidney Disease.   Recent Labs    01/05/19 01/06/19 01/13/19  NA 137 133* 135*  K 4.6 4.6 4.1  BUN _0 CREATININE 0.8 0.8 0.8   Recent Labs    05/06/18  05/11/18 09/21/18  AST 18 5* 22  22  ALT 9 5* 11  11  ALKPHOS 82 66 78  78   Recent Labs    09/21/18  WBC 5.1  HGB 13.0  HCT 38  PLT 227   Recent Labs    05/06/18 05/11/18 09/21/18  CHOL 191 123 187  LDLCALC 109 59 103  TRIG 49 107 41   No results found for: [facility] Lab Results  Component Value Date   TSH 1.41 09/21/2018   Lab Results  Component Value Date   HGBA1C 5.3 10/14/2017   Lab Results  Component Value Date   CHOL 187 09/21/2018   HDL 75 (A) 09/21/2018   LDLCALC 103 09/21/2018   TRIG 41 09/21/2018    Significant Diagnostic Results in last 30 days:  No results found.  Assessment and Plan  GERD (gastroesophageal reflux disease) No problems reported; continue Pepcid 20 mgDaily  Hypothyroidism Chronic and stable; continue Synthroid 75 mcg daily  Vascular dementia without behavioral disturbance Continue without declines; continue Namenda 10 mg twice daily off it was different than thinking ex-girlfriend thing in  Real time reverse transcriptase PCR positive for COVID-19 virus Patient was covered positive on 7/21, she was completely asymptomatic, and by the time her test results returned she had already completed 7 days per the give her [place]; patient continues to do very well as if nothing ever happened    Tricia Duos, MD

## 2019-03-12 ENCOUNTER — Encounter: Payer: Self-pay | Admitting: Internal Medicine

## 2019-03-12 NOTE — Assessment & Plan Note (Signed)
Chronic and stable; continue Synthroid 75 mcg daily 

## 2019-03-12 NOTE — Assessment & Plan Note (Signed)
No problems reported; continue Pepcid 20 mgDaily

## 2019-03-12 NOTE — Assessment & Plan Note (Signed)
Continue without declines; continue Namenda 10 mg twice daily off it was different than thinking ex-girlfriend thing in

## 2019-03-12 NOTE — Assessment & Plan Note (Signed)
Patient was covered positive on 7/21, she was completely asymptomatic, and by the time her test results returned she had already completed 7 days per the give her [place]; patient continues to do very well as if nothing ever happened

## 2019-04-08 LAB — BASIC METABOLIC PANEL
BUN: 28 — AB (ref 4–21)
CO2: 28 — AB (ref 13–22)
Chloride: 97 — AB (ref 99–108)
Creatinine: 0.8 (ref 0.5–1.1)
Glucose: 89
Potassium: 4.4 (ref 3.4–5.3)
Sodium: 136 — AB (ref 137–147)

## 2019-04-08 LAB — COMPREHENSIVE METABOLIC PANEL
Calcium: 9.2 (ref 8.7–10.7)
GFR calc Af Amer: 75.11
GFR calc non Af Amer: 64.81

## 2019-04-20 ENCOUNTER — Encounter: Payer: Self-pay | Admitting: Internal Medicine

## 2019-04-20 NOTE — Progress Notes (Signed)
This encounter was created in error - please disregard.

## 2019-04-20 NOTE — Progress Notes (Deleted)
Location:  [facility] Room Number: 417-W Place of Service:  SNF (31)  Alexander, Anne D, MD  Patient Care Team: Alexander, Anne D, MD as PCP - General (Internal Medicine)  Extended Emergency Contact Information Primary Emergency Contact: Culclasure,[name]  [place] Home Phone: [phone] Relation: Other    Allergies: Ciprofloxacin hcl, Codeine, Duloxetine hcl, Hctz [hydrochlorothiazide], Metaxalone, Morphine and related, Olmesartan, Penicillins, Quetiapine fumarate, and Zofran [ondansetron hcl]  Chief Complaint  Patient presents with  . Medical Management of Chronic Issues    Routine [facility] visit    HPI: Patient is an 85 y.o. female who   Past Medical History:  Diagnosis Date  . Anxiety state 02/25/2013  . Chronic kidney disease, stage III (moderate) (HCC)   . CKD (chronic kidney disease) stage 3, GFR 30-59 ml/min (HCC) 04/14/2016  . Dementia, multi-infarct (HCC) 10/12/2012  . Depression 10/12/2012  . Gastroesophageal reflux disease without esophagitis   . Generalized anxiety disorder   . GERD (gastroesophageal reflux disease) 10/12/2012  . History of falling   . History of pulmonary embolism 10/12/2012  . Hyperlipidemia   . Hypertension   . Hypo-osmolality and hyponatremia   . Hypokalemia   . Hypothyroid   . Long term current use of anticoagulant therapy 10/12/2012  . Major depressive disorder, single episode, unspecified   . OA (osteoarthritis)   . Old myocardial infarction   . Osteoarthritis 02/04/2013  . PE (pulmonary embolism)   . Peripheral vascular disease, unspecified (HCC)   . Vascular dementia without behavioral disturbance (HCC)     Past Surgical History:  Procedure Laterality Date  . JOINT REPLACEMENT     B knees    Allergies as of 04/20/2019      Reactions   Ciprofloxacin Hcl    Codeine    Duloxetine Hcl    Hctz [hydrochlorothiazide]    Metaxalone    Morphine And Related    Olmesartan    Penicillins    Quetiapine Fumarate    Zofran [ondansetron Hcl]       Medication List       Accurate as of April 20, 2019 10:37 AM. If you have any questions, ask your nurse or doctor.        ARTIFICIAL TEARS OP Apply 1 drop to eye daily as needed.   Daily Vite Tabs Take 1 tablet by mouth daily.   docusate sodium 100 MG capsule Commonly known as: COLACE Take 100 mg by mouth 2 (two) times daily. Hold for loose stool   famotidine 20 MG tablet Commonly known as: PEPCID Take 20 mg by mouth daily.   fish oil-omega-3 fatty acids 1000 MG capsule Take 1 g by mouth 2 (two) times daily. For hyperlipidemia   levothyroxine 75 MCG tablet Commonly known as: SYNTHROID Take 75 mcg by mouth daily. For hypothyroidism   lisinopril 2.5 MG tablet Commonly known as: ZESTRIL Take 2.5 mg by mouth daily. What changed: Another medication with the same name was removed. Continue taking this medication, and follow the directions you see here. Changed by: Anne Alexander, MD   memantine 10 MG tablet Commonly known as: NAMENDA Take 10 mg by mouth 2 (two) times daily. For dementia   Oyster Shell Calcium 500 MG Tabs Take 1 tablet by mouth daily.   polyethylene glycol 17 g packet Commonly known as: MIRALAX / GLYCOLAX Take 17 g by mouth daily. HOLD FOR LOOSE STOOLS   PreviDent 5000 Plus 1.1 % Crea dental cream   Generic drug: sodium fluoride Place 1 application onto teeth every evening. BRUSH TEETH WITH TOOTHPASTE AFTER PM MOUTH CARE. SPIT OUT EXCESS AND DO NOT RINSE   rivaroxaban 20 MG Tabs tablet Commonly known as: XARELTO Take 20 mg by mouth daily with supper.   sodium chloride 1 g tablet Take 1 g by mouth See admin instructions. Take 1 tablet at breakfast and dinner, 2 tablets at lunch   Tylenol 8 Hour Arthritis Pain 650 MG CR tablet Generic drug: acetaminophen Take 650 mg by mouth every 12 (twelve) hours. For arthritis   venlafaxine XR 75 MG 24 hr capsule Commonly known as:  EFFEXOR-XR Take 75 mg by mouth daily with breakfast.   Vitamin D3 1.25 MG (50000 UT) Caps Take 1 capsule by mouth once a week.       No orders of the defined types were placed in this encounter.   Immunization History  Administered Date(s) Administered  . Influenza-Unspecified 04/26/2013, 05/28/2016, 05/29/2017, 04/11/2018  . Pneumococcal Conjugate-13 07/06/2017    Social History   Tobacco Use  . Smoking status: Never Smoker  . Smokeless tobacco: Never Used  Substance Use Topics  . Alcohol use: No    Review of Systems  DATA OBTAINED: from patient, nurse, medical record, family member GENERAL:  no fevers, fatigue, appetite changes SKIN: No itching, rash HEENT: No complaint RESPIRATORY: No cough, wheezing, SOB CARDIAC: No chest pain, palpitations, lower extremity edema  GI: No abdominal pain, No N/V/D or constipation, No heartburn or reflux  GU: No dysuria, frequency or urgency, or incontinence  MUSCULOSKELETAL: No unrelieved bone/joint pain NEUROLOGIC: No headache, dizziness  PSYCHIATRIC: No overt anxiety or sadness  Vitals:   04/20/19 1030  BP: 125/65  Pulse: 69  Resp: 18  Temp: 98.9 F (37.2 C)   Body mass index is 32.54 kg/m. Physical Exam  GENERAL APPEARANCE: Alert, conversant, No acute distress  SKIN: No diaphoresis rash HEENT: Unremarkable RESPIRATORY: Breathing is even, unlabored. Lung sounds are clear   CARDIOVASCULAR: Heart RRR no murmurs, rubs or gallops. No peripheral edema  GASTROINTESTINAL: Abdomen is soft, non-tender, not distended w/ normal bowel sounds.  GENITOURINARY: Bladder non tender, not distended  MUSCULOSKELETAL: No abnormal joints or musculature NEUROLOGIC: Cranial nerves 2-12 grossly intact. Moves all extremities PSYCHIATRIC: Mood and affect appropriate to situation, no behavioral issues  Patient Active Problem List   Diagnosis Date Noted  . Real time reverse transcriptase PCR positive for COVID-19 virus 03/02/2019  . Major  depressive disorder, single episode, unspecified 06/20/2017  . Peripheral vascular disease, unspecified (HCC) 04/05/2017  . Vascular dementia without behavioral disturbance (HCC) 02/06/2017  . CKD (chronic kidney disease) stage 3, GFR 30-59 ml/min (HCC) 04/14/2016  . Hyperlipidemia 03/17/2016  . Fever 05/10/2014  . Nausea with vomiting 03/14/2014  . Mouth pain 11/17/2013  . Anxiety state 02/25/2013  . Lethargy 02/25/2013  . Osteoarthritis 02/04/2013  . Edema 02/04/2013  . Other convulsions 11/27/2012  . Unspecified constipation 11/27/2012  . History of pulmonary embolism 10/12/2012  . Long term current use of anticoagulant therapy 10/12/2012  . E. coli UTI 10/12/2012  . Essential hypertension, benign 10/12/2012  . Depression 10/12/2012  . Hypothyroidism 10/12/2012  . Dementia, multi-infarct (HCC) 10/12/2012  . GERD (gastroesophageal reflux disease) 10/12/2012  . Psychosis (HCC) 10/12/2012  . Hypokalemia 10/12/2012  . Hyponatremia 10/12/2012    CMP     Component Value Date/Time   NA 135 (A) 01/13/2019   NA 139 10/14/2017   K 4.1 01/13/2019   K 4.4 10/14/2017     CL 98 08/28/2010 1845   CO2 31 08/28/2010 1845   GLUCOSE 89 08/28/2010 1845   BUN 19 01/13/2019   CREATININE 0.8 01/13/2019   CREATININE 0.84 10/14/2017   CALCIUM 9.1 10/14/2017   PROT 6 10/14/2017   ALBUMIN 4 10/14/2017   AST 22 09/21/2018   AST 22 09/21/2018   AST 19 10/14/2017   ALT 11 09/21/2018   ALT 11 09/21/2018   ALT 11 10/14/2017   ALKPHOS 78 09/21/2018   ALKPHOS 78 09/21/2018   ALKPHOS 87 10/14/2017   BILITOT 0.2 10/14/2017   GFRNONAA 54 (L) 08/28/2010 1845   GFRAA  08/28/2010 1845    >60        The eGFR has been calculated using the MDRD equation. This calculation has not been validated in all clinical situations. eGFR's persistently <60 mL/min signify possible Chronic Kidney Disease.   Recent Labs    01/05/19 01/06/19 01/13/19  NA 137 133* 135*  K 4.6 4.6 4.1  BUN _0 CREATININE 0.8 0.8 0.8   Recent Labs    05/06/18 05/11/18 09/21/18  AST 18 5* 22  22  ALT 9 5* 11  11  ALKPHOS 82 66 78  78   Recent Labs    09/21/18  WBC 5.1  HGB 13.0  HCT 38  PLT 227   Recent Labs    05/06/18 05/11/18 09/21/18  CHOL 191 123 187  LDLCALC 109 59 103  TRIG 49 107 41   No results found for: [facility] Lab Results  Component Value Date   TSH 1.41 09/21/2018   Lab Results  Component Value Date   HGBA1C 5.3 10/14/2017   Lab Results  Component Value Date   CHOL 187 09/21/2018   HDL 75 (A) 09/21/2018   LDLCALC 103 09/21/2018   TRIG 41 09/21/2018    Significant Diagnostic Results in last 30 days:  No results found.  Assessment and Plan  No problem-specific Assessment & Plan notes found for this encounter.   Labs/tests ordered:    [name], MD

## 2019-04-26 ENCOUNTER — Non-Acute Institutional Stay (SKILLED_NURSING_FACILITY): Payer: Medicare Other | Admitting: Internal Medicine

## 2019-04-26 ENCOUNTER — Encounter: Payer: Self-pay | Admitting: Internal Medicine

## 2019-04-26 DIAGNOSIS — M8949 Other hypertrophic osteoarthropathy, multiple sites: Secondary | ICD-10-CM | POA: Diagnosis not present

## 2019-04-26 DIAGNOSIS — Z86711 Personal history of pulmonary embolism: Secondary | ICD-10-CM

## 2019-04-26 DIAGNOSIS — F329 Major depressive disorder, single episode, unspecified: Secondary | ICD-10-CM

## 2019-04-26 DIAGNOSIS — F32A Depression, unspecified: Secondary | ICD-10-CM

## 2019-04-26 DIAGNOSIS — M159 Polyosteoarthritis, unspecified: Secondary | ICD-10-CM

## 2019-04-26 NOTE — Progress Notes (Signed)
Location:  [facility] Room Number: 417-W Place of Service:  SNF (31)  [name], MD  Patient Care Team: [name], MD as PCP - General (Internal Medicine)  Extended Emergency Contact Information Primary Emergency Contact: Culclasure,[name]  [place] Phone: [phone] Relation: Other    Allergies: Ciprofloxacin hcl, Codeine, Duloxetine hcl, Hctz [hydrochlorothiazide], Metaxalone, Morphine and related, Olmesartan, Penicillins, Quetiapine fumarate, and Zofran [name] hcl]  Chief Complaint  Patient presents with  . Medical Management of Chronic Issues    Routine [facility] visit    HPI: Patient is an 85 y.o. female who is being seen for routine issues of osteoarthritis, depression, and history of PE.  Past Medical History:  Diagnosis Date  . Anxiety state 02/25/2013  . Chronic kidney disease, stage III (moderate)   . CKD (chronic kidney disease) stage 3, GFR 30-59 ml/min 04/14/2016  . Dementia, multi-infarct ([place]) 10/12/2012  . Depression 10/12/2012  . Gastroesophageal reflux disease without esophagitis   . Generalized anxiety disorder   . GERD (gastroesophageal reflux disease) 10/12/2012  . History of falling   . History of pulmonary embolism 10/12/2012  . Hyperlipidemia   . Hypertension   . Hypo-osmolality and hyponatremia   . Hypokalemia   . Hypothyroid   . Long term current use of anticoagulant therapy 10/12/2012  . Major depressive disorder, single episode, unspecified   . OA (osteoarthritis)   . Old myocardial infarction   . Osteoarthritis 02/04/2013  . PE (pulmonary embolism)   . Peripheral vascular disease, unspecified ([place])   . Vascular dementia without behavioral disturbance [facility])     Past Surgical History:  Procedure Laterality Date  . JOINT REPLACEMENT     B knees    Allergies as of 04/26/2019      Reactions   Ciprofloxacin Hcl    Codeine    Duloxetine Hcl    Hctz  [hydrochlorothiazide]    Metaxalone    Morphine And Related    Olmesartan    Penicillins    Quetiapine Fumarate    Zofran [ondansetron Hcl]       Medication List       Accurate as of April 26, 2019 11:59 PM. If you have any questions, ask your nurse or doctor.        ARTIFICIAL TEARS OP Apply 1 drop to eye daily as needed (Eye irritation - both eyes).   Daily Vite Tabs Take 1 tablet by mouth daily.   docusate sodium 100 MG capsule Commonly known as: COLACE Take 100 mg by mouth 2 (two) times daily. Hold for loose stool   famotidine 20 MG tablet Commonly known as: PEPCID Take 20 mg by mouth daily.   fish oil-omega-3 fatty acids 1000 MG capsule Take 1 g by mouth 2 (two) times daily. For hyperlipidemia   levothyroxine 75 MCG tablet Commonly known as: SYNTHROID Take 75 mcg by mouth daily. For hypothyroidism   lisinopril 2.5 MG tablet Commonly known as: ZESTRIL Take 2.5 mg by mouth daily.   memantine 10 MG tablet Commonly known as: NAMENDA Take 10 mg by mouth 2 (two) times daily. For dementia   Oyster Shell Calcium 500 MG Tabs Take 1 tablet by mouth daily.   polyethylene glycol 17 g packet Commonly known as: MIRALAX / GLYCOLAX Take 17 g by mouth daily. HOLD FOR LOOSE STOOLS   PreviDent 5000 Plus 1.1 % Crea dental cream Generic drug: sodium fluoride Place 1 application onto teeth every  evening. BRUSH TEETH WITH TOOTHPASTE AFTER PM MOUTH CARE. SPIT OUT EXCESS AND DO NOT RINSE   rivaroxaban 20 MG Tabs tablet Commonly known as: XARELTO Take 20 mg by mouth daily with supper.   sodium chloride 1 g tablet Take 1 g by mouth See admin instructions. Take 1 tablet at breakfast and dinner, 2 tablets at lunch   Tylenol 8 Hour Arthritis Pain 650 MG CR tablet Generic drug: acetaminophen Take 650 mg by mouth every 12 (twelve) hours. For arthritis   venlafaxine XR 75 MG 24 hr capsule Commonly known as: EFFEXOR-XR Take 75 mg by mouth daily with breakfast.   Vitamin D3  1.25 MG (50000 UT) Caps Take 1 capsule by mouth once a week.       No orders of the defined types were placed in this encounter.   Immunization History  Administered Date(s) Administered  . Influenza-Unspecified 04/26/2013, 05/29/2017, 04/11/2018, 04/22/2019  . Pneumococcal Conjugate-13 07/06/2017  . Pneumococcal Polysaccharide-23 01/05/2019    Social History   Tobacco Use  . Smoking status: Never Smoker  . Smokeless tobacco: Never Used  Substance Use Topics  . Alcohol use: No    Review of Systems  DATA OBTAINED: from patient GENERAL:  no fevers, fatigue, appetite changes SKIN: No itching, rash HEENT: No complaint RESPIRATORY: No cough, wheezing, SOB CARDIAC: No chest pain, palpitations, lower extremity edema  GI: No abdominal pain, No N/V/D or constipation, No heartburn or reflux  GU: No dysuria, frequency or urgency, or incontinence  MUSCULOSKELETAL: No unrelieved bone/joint pain NEUROLOGIC: No headache, dizziness  PSYCHIATRIC: No overt anxiety or sadness  Vitals:   04/26/19 1036  BP: 122/67  Pulse: 74  Resp: 16  Temp: (!) 97.2 F (36.2 C)   Body mass index is 32.54 kg/m. Physical Exam  GENERAL APPEARANCE: Alert, conversant, No acute distress  SKIN: No diaphoresis rash HEENT: Unremarkable RESPIRATORY: Breathing is even, unlabored. Lung sounds are clear   CARDIOVASCULAR: Heart RRR no murmurs, rubs or gallops. No peripheral edema  GASTROINTESTINAL: Abdomen is soft, non-tender, not distended w/ normal bowel sounds.  GENITOURINARY: Bladder non tender, not distended  MUSCULOSKELETAL: No abnormal joints or musculature NEUROLOGIC: Cranial nerves 2-12 grossly intact. Moves all extremities PSYCHIATRIC: Mood and affect appropriate to situation, no behavioral issues  Patient Active Problem List   Diagnosis Date Noted  . Real time reverse transcriptase PCR positive for COVID-19 virus 03/02/2019  . Major depressive disorder, single episode, unspecified 06/20/2017   . Peripheral vascular disease, unspecified ([place]) 04/05/2017  . Vascular dementia without behavioral disturbance ([place]) 02/06/2017  . CKD (chronic kidney disease) stage 3, GFR 30-59 ml/min 04/14/2016  . Hyperlipidemia 03/17/2016  . Fever 05/10/2014  . Nausea with vomiting 03/14/2014  . Mouth pain 11/17/2013  . Anxiety state 02/25/2013  . Lethargy 02/25/2013  . Osteoarthritis 02/04/2013  . Edema 02/04/2013  . Other convulsions 11/27/2012  . Unspecified constipation 11/27/2012  . History of pulmonary embolism 10/12/2012  . Long term current use of anticoagulant therapy 10/12/2012  . E. coli UTI 10/12/2012  . Essential hypertension, benign 10/12/2012  . Depression 10/12/2012  . Hypothyroidism 10/12/2012  . Dementia, multi-infarct ([place]) 10/12/2012  . GERD (gastroesophageal reflux disease) 10/12/2012  . Psychosis ([place]) 10/12/2012  . Hypokalemia 10/12/2012  . Hyponatremia 10/12/2012    CMP     Component Value Date/Time   NA 135 (A) 01/13/2019   NA 139 10/14/2017   K 4.1 01/13/2019   K 4.4 10/14/2017   CL 98 08/28/2010 1845   CO2 31  08/28/2010 1845   GLUCOSE 89 08/28/2010 1845   BUN 19 01/13/2019   CREATININE 0.8 01/13/2019   CREATININE 0.84 10/14/2017   CALCIUM 9.1 10/14/2017   PROT 6 10/14/2017   ALBUMIN 4 10/14/2017   AST 22 09/21/2018   AST 22 09/21/2018   AST 19 10/14/2017   ALT 11 09/21/2018   ALT 11 09/21/2018   ALT 11 10/14/2017   ALKPHOS 78 09/21/2018   ALKPHOS 78 09/21/2018   ALKPHOS 87 10/14/2017   BILITOT 0.2 10/14/2017   GFRNONAA 54 (L) 08/28/2010 1845   GFRAA  08/28/2010 1845    >60        The eGFR has been calculated using the MDRD equation. This calculation has not been validated in all clinical situations. eGFR's persistently <60 mL/min signify possible Chronic Kidney Disease.   Recent Labs    01/05/19 01/06/19 01/13/19  NA 137 133* 135*  K 4.6 4.6 4.1  BUN _0 CREATININE 0.8 0.8 0.8   Recent Labs    05/06/18 05/11/18 09/21/18   AST 18 5* 22  22  ALT 9 5* 11  11  ALKPHOS 82 66 78  78   Recent Labs    09/21/18  WBC 5.1  HGB 13.0  HCT 38  PLT 227   Recent Labs    05/06/18 05/11/18 09/21/18  CHOL 191 123 187  LDLCALC 109 59 103  TRIG 49 107 41   No results found for: [facility] Lab Results  Component Value Date   TSH 1.41 09/21/2018   Lab Results  Component Value Date   HGBA1C 5.3 10/14/2017   Lab Results  Component Value Date   CHOL 187 09/21/2018   HDL 75 (A) 09/21/2018   LDLCALC 103 09/21/2018   TRIG 41 09/21/2018    Significant Diagnostic Results in last 30 days:  No results found.  Assessment and Plan  Osteoarthritis Continues without complaint; continue Tylenol 650 mg every 12 hours scheduled  Depression Continues stable; continue Effexor 75 mg daily  History of pulmonary embolism Chronic and stable; continue Xarelto 20 mg daily    [name], MD

## 2019-05-01 ENCOUNTER — Encounter: Payer: Self-pay | Admitting: Internal Medicine

## 2019-05-01 NOTE — Assessment & Plan Note (Signed)
Continues stable; continue Effexor 75 mg daily

## 2019-05-01 NOTE — Assessment & Plan Note (Signed)
Continues without complaint; continue Tylenol 650 mg every 12 hours scheduled

## 2019-05-01 NOTE — Assessment & Plan Note (Signed)
Chronic and stable; continue Xarelto 20 mg daily 

## 2019-05-26 LAB — VITAMIN D 25 HYDROXY (VIT D DEFICIENCY, FRACTURES): Vit D, 25-Hydroxy: 60

## 2019-05-26 LAB — CBC AND DIFFERENTIAL
HCT: 41 (ref 36–46)
Hemoglobin: 13.8 (ref 12.0–16.0)
Platelets: 193 (ref 150–399)
WBC: 4.1

## 2019-05-26 LAB — CBC: RBC: 4.43 (ref 3.87–5.11)

## 2019-05-31 ENCOUNTER — Encounter: Payer: Self-pay | Admitting: Internal Medicine

## 2019-05-31 ENCOUNTER — Non-Acute Institutional Stay (SKILLED_NURSING_FACILITY): Payer: Medicare Other | Admitting: Internal Medicine

## 2019-05-31 DIAGNOSIS — I739 Peripheral vascular disease, unspecified: Secondary | ICD-10-CM | POA: Diagnosis not present

## 2019-05-31 DIAGNOSIS — I10 Essential (primary) hypertension: Secondary | ICD-10-CM | POA: Diagnosis not present

## 2019-05-31 DIAGNOSIS — F0150 Vascular dementia without behavioral disturbance: Secondary | ICD-10-CM

## 2019-05-31 NOTE — Progress Notes (Signed)
Location:  [facility] [place] Room Number: 417/W Place of Service:  SNF (31)  Tricia Duos, MD  Patient Care Team: Tricia Duos, MD as PCP - General (Internal Medicine)  Extended Emergency Contact Information Primary Emergency Contact: Culclasure,[name]  [place] Phone: [phone] Relation: Other    Allergies: Ciprofloxacin hcl, Codeine, Duloxetine hcl, Hctz [hydrochlorothiazide], Metaxalone, Morphine and related, Olmesartan, Penicillins, Quetiapine fumarate, and Zofran [name] hcl]  Chief Complaint  Patient presents with  . Medical Management of Chronic Issues    Routine visit of medical management    HPI: Patient is 85 y.o. female who is being seen for routine issues of multi-infarct dementia, hypertension, and PVD.  Past Medical History:  Diagnosis Date  . Anxiety state 02/25/2013  . Chronic kidney disease, stage III (moderate)   . CKD (chronic kidney disease) stage 3, GFR 30-59 ml/min 04/14/2016  . Dementia, multi-infarct ([place]) 10/12/2012  . Depression 10/12/2012  . Gastroesophageal reflux disease without esophagitis   . Generalized anxiety disorder   . GERD (gastroesophageal reflux disease) 10/12/2012  . History of falling   . History of pulmonary embolism 10/12/2012  . Hyperlipidemia   . Hypertension   . Hypo-osmolality and hyponatremia   . Hypokalemia   . Hypothyroid   . Long term current use of anticoagulant therapy 10/12/2012  . Major depressive disorder, single episode, unspecified   . OA (osteoarthritis)   . Old myocardial infarction   . Osteoarthritis 02/04/2013  . PE (pulmonary embolism)   . Peripheral vascular disease, unspecified ([place])   . Vascular dementia without behavioral disturbance [facility])     Past Surgical History:  Procedure Laterality Date  . JOINT REPLACEMENT     B knees    Allergies as of 05/31/2019      Reactions   Ciprofloxacin Hcl    Codeine    Duloxetine Hcl    Hctz  [hydrochlorothiazide]    Metaxalone    Morphine And Related    Olmesartan    Penicillins    Quetiapine Fumarate    Zofran [ondansetron Hcl]       Medication List       Accurate as of May 31, 2019 11:59 PM. If you have any questions, ask your nurse or doctor.        ARTIFICIAL TEARS OP Apply 1 drop to eye daily as needed (Eye irritation - both eyes).   Daily Vite Tabs Take 1 tablet by mouth daily.   docusate sodium 100 MG capsule Commonly known as: COLACE Take 100 mg by mouth 2 (two) times daily. Hold for loose stool   famotidine 20 MG tablet Commonly known as: PEPCID Take 20 mg by mouth daily.   fish oil-omega-3 fatty acids 1000 MG capsule Take 1 g by mouth 2 (two) times daily. For hyperlipidemia   levothyroxine 75 MCG tablet Commonly known as: SYNTHROID Take 75 mcg by mouth daily. For hypothyroidism   lisinopril 2.5 MG tablet Commonly known as: ZESTRIL Take 2.5 mg by mouth daily.   memantine 10 MG tablet Commonly known as: NAMENDA Take 10 mg by mouth 2 (two) times daily. For dementia   Oyster Shell Calcium 500 MG Tabs Take 1 tablet by mouth daily.   polyethylene glycol 17 g packet Commonly known as: MIRALAX / GLYCOLAX Take 17 g by mouth daily. HOLD FOR LOOSE STOOLS   PreviDent 5000 Plus 1.1 % Crea dental cream Generic drug: sodium fluoride Place 1 application onto teeth every evening. BRUSH  TEETH WITH TOOTHPASTE AFTER PM MOUTH CARE. SPIT OUT EXCESS AND DO NOT RINSE   rivaroxaban 20 MG Tabs tablet Commonly known as: XARELTO Take 20 mg by mouth daily with supper.   sodium chloride 1 g tablet Take 1 g by mouth See admin instructions. Take 1 tablet at breakfast and dinner, 2 tablets at lunch   Tylenol 8 Hour Arthritis Pain 650 MG CR tablet Generic drug: acetaminophen Take 650 mg by mouth every 12 (twelve) hours. For arthritis   venlafaxine XR 75 MG 24 hr capsule Commonly known as: EFFEXOR-XR Take 75 mg by mouth daily with breakfast.   Vitamin D3  1.25 MG (50000 UT) Caps Take 1 capsule by mouth once a week.       No orders of the defined types were placed in this encounter.   Immunization History  Administered Date(s) Administered  . Influenza-Unspecified 04/26/2013, 05/29/2017, 04/11/2018, 04/22/2019  . Pneumococcal Conjugate-13 07/06/2017  . Pneumococcal Polysaccharide-23 01/05/2019    Social History   Tobacco Use  . Smoking status: Never Smoker  . Smokeless tobacco: Never Used  Substance Use Topics  . Alcohol use: No    Review of Systems  DATA OBTAINED: from patient GENERAL:  no fevers, fatigue, appetite changes SKIN: No itching, rash HEENT: No complaint RESPIRATORY: No cough, wheezing, SOB CARDIAC: No chest pain, palpitations, lower extremity edema  GI: No abdominal pain, No N/V/D or constipation, No heartburn or reflux  GU: No dysuria, frequency or urgency, or incontinence  MUSCULOSKELETAL: No unrelieved bone/joint pain NEUROLOGIC: No headache, dizziness  PSYCHIATRIC: No overt anxiety or sadness  Vitals:   05/31/19 1201  BP: 118/77  Pulse: 75  Resp: 18  Temp: 98.2 F (36.8 C)  SpO2: 95%   Body mass index is 32.05 kg/m. Physical Exam  GENERAL APPEARANCE: Alert, conversant, No acute distress  SKIN: No diaphoresis rash HEENT: Unremarkable RESPIRATORY: Breathing is even, unlabored. Lung sounds are clear   CARDIOVASCULAR: Heart RRR no murmurs, rubs or gallops. No peripheral edema  GASTROINTESTINAL: Abdomen is soft, non-tender, not distended w/ normal bowel sounds.  GENITOURINARY: Bladder non tender, not distended  MUSCULOSKELETAL: No abnormal joints or musculature NEUROLOGIC: Cranial nerves 2-12 grossly intact. Moves all extremities PSYCHIATRIC: Mood and affect appropriate to situation, no behavioral issues  Patient Active Problem List   Diagnosis Date Noted  . Real time reverse transcriptase PCR positive for COVID-19 virus 03/02/2019  . Major depressive disorder, single episode, unspecified  06/20/2017  . Peripheral vascular disease, unspecified ([place]) 04/05/2017  . Vascular dementia without behavioral disturbance ([place]) 02/06/2017  . CKD (chronic kidney disease) stage 3, GFR 30-59 ml/min 04/14/2016  . Hyperlipidemia 03/17/2016  . Fever 05/10/2014  . Nausea with vomiting 03/14/2014  . Mouth pain 11/17/2013  . Anxiety state 02/25/2013  . Lethargy 02/25/2013  . Osteoarthritis 02/04/2013  . Edema 02/04/2013  . Other convulsions 11/27/2012  . Unspecified constipation 11/27/2012  . History of pulmonary embolism 10/12/2012  . Long term current use of anticoagulant therapy 10/12/2012  . E. coli UTI 10/12/2012  . Essential hypertension, benign 10/12/2012  . Depression 10/12/2012  . Hypothyroidism 10/12/2012  . Dementia, multi-infarct ([place]) 10/12/2012  . GERD (gastroesophageal reflux disease) 10/12/2012  . Psychosis ([place]) 10/12/2012  . Hypokalemia 10/12/2012  . Hyponatremia 10/12/2012    CMP     Component Value Date/Time   NA 135 (A) 01/13/2019   NA 139 10/14/2017   K 4.1 01/13/2019   K 4.4 10/14/2017   CL 98 08/28/2010 1845   CO2 31  08/28/2010 1845   GLUCOSE 89 08/28/2010 1845   BUN 19 01/13/2019   CREATININE 0.8 01/13/2019   CREATININE 0.84 10/14/2017   CALCIUM 9.1 10/14/2017   PROT 6 10/14/2017   ALBUMIN 4 10/14/2017   AST 22 09/21/2018   AST 22 09/21/2018   AST 19 10/14/2017   ALT 11 09/21/2018   ALT 11 09/21/2018   ALT 11 10/14/2017   ALKPHOS 78 09/21/2018   ALKPHOS 78 09/21/2018   ALKPHOS 87 10/14/2017   BILITOT 0.2 10/14/2017   GFRNONAA 54 (L) 08/28/2010 1845   GFRAA  08/28/2010 1845    >60        The eGFR has been calculated using the MDRD equation. This calculation has not been validated in all clinical situations. eGFR's persistently <60 mL/min signify possible Chronic Kidney Disease.   Recent Labs    01/05/19 01/06/19 01/13/19  NA 137 133* 135*  K 4.6 4.6 4.1  BUN _0 CREATININE 0.8 0.8 0.8   Recent Labs    09/21/18  AST 22   22  ALT 11  11  ALKPHOS 78  78   Recent Labs    09/21/18  WBC 5.1  HGB 13.0  HCT 38  PLT 227   Recent Labs    09/21/18  CHOL 187  LDLCALC 103  TRIG 41   No results found for: [facility] Lab Results  Component Value Date   TSH 1.41 09/21/2018   Lab Results  Component Value Date   HGBA1C 5.3 10/14/2017   Lab Results  Component Value Date   CHOL 187 09/21/2018   HDL 75 (A) 09/21/2018   LDLCALC 103 09/21/2018   TRIG 41 09/21/2018    Significant Diagnostic Results in last 30 days:  No results found.  Assessment and Plan  Dementia, multi-infarct Mild to moderate; seems to be weathering the Covid isolation well; continue Namenda 10 mg twice daily 16x  Essential hypertension, benign Controlled; continue lisinopril 2.5 mg daily  Peripheral vascular disease, unspecified (H[facility]) No known wound; continue Xarelto 20 mg daily; patient is on     Tricia Duos, MD

## 2019-06-05 ENCOUNTER — Encounter: Payer: Self-pay | Admitting: Internal Medicine

## 2019-06-05 NOTE — Assessment & Plan Note (Signed)
Controlled; continue lisinopril 2.5 mg daily 

## 2019-06-05 NOTE — Assessment & Plan Note (Signed)
Mild to moderate; seems to be weathering the Covid isolation well; continue Namenda 10 mg twice daily 16x

## 2019-06-06 NOTE — Assessment & Plan Note (Signed)
No known wound; continue Xarelto 20 mg daily; patient is on

## 2019-07-06 ENCOUNTER — Non-Acute Institutional Stay (SKILLED_NURSING_FACILITY): Payer: Medicare Other | Admitting: Internal Medicine

## 2019-07-06 ENCOUNTER — Encounter: Payer: Self-pay | Admitting: Internal Medicine

## 2019-07-06 DIAGNOSIS — Z789 Other specified health status: Secondary | ICD-10-CM

## 2019-07-06 NOTE — Progress Notes (Signed)
Location:  [facility] [place] Room Number: 417-W Place of Service:  SNF (31)  Tricia Duos, MD  Patient Care Team: Tricia Duos, MD as PCP - General (Internal Medicine)  Extended Emergency Contact Information Primary Emergency Contact: Martin,Tricia  [place] Phone: [phone] Relation: Other    Allergies: Ciprofloxacin Martin, Codeine, Duloxetine Martin, Hctz [hydrochlorothiazide], Metaxalone, Morphine and related, Olmesartan, Penicillins, Quetiapine fumarate, and Zofran Tricia Martin]  Chief Complaint  Patient presents with  . Acute Visit    Patient is seen for COVID prophylaxis    HPI: Patient is 85 y.o. female who is being seen for viral prophylaxis.  Everyone in the facility has been placed on vitamin D, vitamin C, and zinc sulfate.  Past Medical History:  Diagnosis Date  . Anxiety state 02/25/2013  . Chronic kidney disease, stage III (moderate)   . CKD (chronic kidney disease) stage 3, GFR 30-59 ml/min 04/14/2016  . Dementia, multi-infarct (Tricia Martin) 10/12/2012  . Depression 10/12/2012  . Gastroesophageal reflux disease without esophagitis   . Generalized anxiety disorder   . GERD (gastroesophageal reflux disease) 10/12/2012  . History of falling   . History of pulmonary embolism 10/12/2012  . Hyperlipidemia   . Hypertension   . Hypo-osmolality and hyponatremia   . Hypokalemia   . Hypothyroid   . Long term current use of anticoagulant therapy 10/12/2012  . Major depressive disorder, single episode, unspecified   . OA (osteoarthritis)   . Old myocardial infarction   . Osteoarthritis 02/04/2013  . PE (pulmonary embolism)   . Peripheral vascular disease, unspecified ([place])   . Vascular dementia without behavioral disturbance Tricia Martin)     Past Surgical History:  Procedure Laterality Date  . JOINT REPLACEMENT     B knees    Allergies as of 07/06/2019      Reactions   Ciprofloxacin Martin    Codeine    Duloxetine Martin     Hctz [hydrochlorothiazide]    Metaxalone    Morphine And Related    Olmesartan    Penicillins    Quetiapine Fumarate    Zofran [ondansetron Martin]       Medication List       Accurate as of July 06, 2019  8:23 PM. If you have any questions, ask your nurse or doctor.        ARTIFICIAL TEARS OP Apply 1 drop to eye daily as needed (Eye irritation - both eyes).   ascorbic acid 500 MG tablet Commonly known as: VITAMIN C Take 500 mg by mouth daily. Start taking on: July 07, 2019   Daily Vite Tabs Take 1 tablet by mouth daily.   docusate sodium 100 MG capsule Commonly known as: COLACE Take 100 mg by mouth 2 (two) times daily. Hold for loose stool   famotidine 20 MG tablet Commonly known as: PEPCID Take 20 mg by mouth daily.   fish oil-omega-3 fatty acids 1000 MG capsule Take 1 g by mouth 2 (two) times daily. For hyperlipidemia   levothyroxine 75 MCG tablet Commonly known as: SYNTHROID Take 75 mcg by mouth daily. For hypothyroidism   lisinopril 2.5 MG tablet Commonly known as: ZESTRIL Take 2.5 mg by mouth daily.   memantine 10 MG tablet Commonly known as: NAMENDA Take 10 mg by mouth 2 (two) times daily. For dementia   Oyster Shell Calcium 500 MG Tabs Take 1 tablet by mouth daily.   polyethylene glycol 17 g packet Commonly known as: MIRALAX /  GLYCOLAX Take 17 g by mouth daily. HOLD FOR LOOSE STOOLS   PreviDent 5000 Plus 1.1 % Crea dental cream Generic drug: sodium fluoride Place 1 application onto teeth every evening. BRUSH TEETH WITH TOOTHPASTE AFTER PM MOUTH CARE. SPIT OUT EXCESS AND DO NOT RINSE   rivaroxaban 20 MG Tabs tablet Commonly known as: XARELTO Take 20 mg by mouth daily with supper.   sodium chloride 1 g tablet Take 1 g by mouth See admin instructions. Take 1 tablet at breakfast and dinner, 2 tablets at lunch   Tylenol 8 Hour Arthritis Pain 650 MG CR tablet Generic drug: acetaminophen Take 650 mg by mouth every 12 (twelve) hours. For  arthritis   venlafaxine XR 75 MG 24 hr capsule Commonly known as: EFFEXOR-XR Take 75 mg by mouth daily with breakfast.   Vitamin D3 1.25 MG (50000 UT) Caps Take 1 capsule by mouth once a week.       No orders of the defined types were placed in this encounter.   Immunization History  Administered Date(s) Administered  . Influenza-Unspecified 04/26/2013, 05/29/2017, 04/11/2018, 04/22/2019  . Pneumococcal Conjugate-13 07/06/2017  . Pneumococcal Polysaccharide-23 01/05/2019    Social History   Tobacco Use  . Smoking status: Never Smoker  . Smokeless tobacco: Never Used  Substance Use Topics  . Alcohol use: No    Review of Systems   GENERAL:  no fevers, fatigue, appetite changes SKIN: No itching, rash HEENT: No complaint RESPIRATORY: No cough, wheezing, SOB CARDIAC: No chest pain, palpitations, lower extremity edema  GI: No abdominal pain, No N/V/D or constipation, No heartburn or reflux  GU: No dysuria, frequency or urgency, or incontinence  MUSCULOSKELETAL: No unrelieved bone/joint pain NEUROLOGIC: No headache, dizziness  PSYCHIATRIC: No overt anxiety or sadness  Vitals:   07/06/19 1515  BP: 124/86  Pulse: 77  Resp: 20  Temp: 98.7 F (37.1 C)  SpO2: 95%   Body mass index is 32.15 kg/m. Physical Exam  GENERAL APPEARANCE: Alert, conversant, No acute distress  SKIN: No diaphoresis rash HEENT: Unremarkable RESPIRATORY: Breathing is even, unlabored. Lung sounds are clear   CARDIOVASCULAR: Heart RRR no murmurs, rubs or gallops. No peripheral edema  GASTROINTESTINAL: Abdomen is soft, non-tender, not distended w/ normal bowel sounds.  GENITOURINARY: Bladder non tender, not distended  MUSCULOSKELETAL: No abnormal joints or musculature NEUROLOGIC: Cranial nerves 2-12 grossly intact. Moves all extremities PSYCHIATRIC: Mood and affect appropriate to situation, no behavioral issues  Patient Active Problem List   Diagnosis Date Noted  . Real time reverse  transcriptase PCR positive for COVID-19 virus 03/02/2019  . Major depressive disorder, single episode, unspecified 06/20/2017  . Peripheral vascular disease, unspecified (Tricia Martin) 04/05/2017  . Vascular dementia without behavioral disturbance (Tricia Martin) 02/06/2017  . CKD (chronic kidney disease) stage 3, GFR 30-59 ml/min 04/14/2016  . Hyperlipidemia 03/17/2016  . Fever 05/10/2014  . Nausea with vomiting 03/14/2014  . Mouth pain 11/17/2013  . Anxiety state 02/25/2013  . Lethargy 02/25/2013  . Osteoarthritis 02/04/2013  . Edema 02/04/2013  . Other convulsions 11/27/2012  . Unspecified constipation 11/27/2012  . History of pulmonary embolism 10/12/2012  . Long term current use of anticoagulant therapy 10/12/2012  . E. coli UTI 10/12/2012  . Essential hypertension, benign 10/12/2012  . Depression 10/12/2012  . Hypothyroidism 10/12/2012  . Dementia, multi-infarct ([place]) 10/12/2012  . GERD (gastroesophageal reflux disease) 10/12/2012  . Psychosis ([place]) 10/12/2012  . Hypokalemia 10/12/2012  . Hyponatremia 10/12/2012    CMP     Component Value Date/Time  NA 135 (A) 01/13/2019 0000   NA 139 10/14/2017 0000   K 4.1 01/13/2019 0000   K 4.4 10/14/2017 0000   CL 98 08/28/2010 1845   CO2 31 08/28/2010 1845   GLUCOSE 89 08/28/2010 1845   BUN 19 01/13/2019 0000   CREATININE 0.8 01/13/2019 0000   CREATININE 0.84 10/14/2017 0000   CALCIUM 9.1 10/14/2017 0000   PROT 6 10/14/2017 0000   ALBUMIN 4 10/14/2017 0000   AST 22 09/21/2018 0000   AST 22 09/21/2018 0000   AST 19 10/14/2017 0000   ALT 11 09/21/2018 0000   ALT 11 09/21/2018 0000   ALT 11 10/14/2017 0000   ALKPHOS 78 09/21/2018 0000   ALKPHOS 78 09/21/2018 0000   ALKPHOS 87 10/14/2017 0000   BILITOT 0.2 10/14/2017 0000   GFRNONAA 54 (L) 08/28/2010 1845   GFRAA  08/28/2010 1845    >60        The eGFR has been calculated using the MDRD equation. This calculation has not been validated in all clinical situations. eGFR's  persistently <60 mL/min signify possible Chronic Kidney Disease.   Recent Labs    01/05/19 0000 01/06/19 0000 01/13/19 0000  NA 137 133* 135*  K 4.6 4.6 4.1  BUN '18 21 19  ' CREATININE 0.8 0.8 0.8   Recent Labs    09/21/18 0000  AST 22  22  ALT 11  11  ALKPHOS 78  78   Recent Labs    09/21/18 0000  WBC 5.1  HGB 13.0  HCT 38  PLT 227   Recent Labs    09/21/18 0000  CHOL 187  LDLCALC 103  TRIG 41   No results found for: [facility] Lab Results  Component Value Date   TSH 1.41 09/21/2018   Lab Results  Component Value Date   HGBA1C 5.3 10/14/2017   Lab Results  Component Value Date   CHOL 187 09/21/2018   HDL 75 (A) 09/21/2018   LDLCALC 103 09/21/2018   TRIG 41 09/21/2018    Significant Diagnostic Results in last 30 days:  No results found.  Assessment and Plan  Does not have immunity to Covid-patient is already on vitamin D 50,000 units weekly which will continue.  Patient has been placed on vitamin C 500 mg daily for 90 days and zinc sulfate 220 mg every other day for 90 days.  Patient will have a vitamin D level drawn today and again in 90 days.    Tricia Duos, MD

## 2019-07-08 LAB — VITAMIN D 25 HYDROXY (VIT D DEFICIENCY, FRACTURES): Vit D, 25-Hydroxy: 60

## 2019-07-10 ENCOUNTER — Encounter: Payer: Self-pay | Admitting: Internal Medicine

## 2019-07-12 ENCOUNTER — Non-Acute Institutional Stay (SKILLED_NURSING_FACILITY): Payer: Medicare Other | Admitting: Internal Medicine

## 2019-07-12 DIAGNOSIS — E034 Atrophy of thyroid (acquired): Secondary | ICD-10-CM | POA: Diagnosis not present

## 2019-07-12 DIAGNOSIS — F0150 Vascular dementia without behavioral disturbance: Secondary | ICD-10-CM | POA: Diagnosis not present

## 2019-07-12 DIAGNOSIS — K219 Gastro-esophageal reflux disease without esophagitis: Secondary | ICD-10-CM | POA: Diagnosis not present

## 2019-07-12 NOTE — Progress Notes (Signed)
Location:  [facility] Room Number: 417-W Place of Service:  SNF (31)  Margit Hanks, MD  Patient Care Team: Margit Hanks, MD as PCP - General (Internal Medicine)  Extended Emergency Contact Information Primary Emergency Contact: Culclasure,[name]  [place] Home Phone: [phone] Relation: Other    Allergies: Ciprofloxacin hcl, Codeine, Duloxetine hcl, Hctz [hydrochlorothiazide], Metaxalone, Morphine and related, Olmesartan, Penicillins, Quetiapine fumarate, and Zofran Frazier Richards hcl]  Chief Complaint  Patient presents with  . Medical Management of Chronic Issues    Routine [facility] visit    HPI: Patient is an 85 y.o. female who is being seen for routine issues of GERD, hypothyroidism, and dementia.  Past Medical History:  Diagnosis Date  . Anxiety state 02/25/2013  . Chronic kidney disease, stage III (moderate)   . CKD (chronic kidney disease) stage 3, GFR 30-59 ml/min 04/14/2016  . Dementia, multi-infarct (HCC) 10/12/2012  . Depression 10/12/2012  . Gastroesophageal reflux disease without esophagitis   . Generalized anxiety disorder   . GERD (gastroesophageal reflux disease) 10/12/2012  . History of falling   . History of pulmonary embolism 10/12/2012  . Hyperlipidemia   . Hypertension   . Hypo-osmolality and hyponatremia   . Hypokalemia   . Hypothyroid   . Long term current use of anticoagulant therapy 10/12/2012  . Major depressive disorder, single episode, unspecified   . OA (osteoarthritis)   . Old myocardial infarction   . Osteoarthritis 02/04/2013  . PE (pulmonary embolism)   . Peripheral vascular disease, unspecified (HCC)   . Vascular dementia without behavioral disturbance [facility])     Past Surgical History:  Procedure Laterality Date  . JOINT REPLACEMENT     B knees    Allergies as of 07/12/2019      Reactions   Ciprofloxacin Hcl    Codeine    Duloxetine Hcl    Hctz [hydrochlorothiazide]     Metaxalone    Morphine And Related    Olmesartan    Penicillins    Quetiapine Fumarate    Zofran [ondansetron Hcl]       Medication List       Accurate as of July 12, 2019 11:59 PM. If you have any questions, ask your nurse or doctor.        ARTIFICIAL TEARS OP Apply 1 drop to eye daily as needed (Eye irritation - both eyes).   ascorbic acid 500 MG tablet Commonly known as: VITAMIN C Take 500 mg by mouth daily.   Daily Vite Tabs Take 1 tablet by mouth daily.   docusate sodium 100 MG capsule Commonly known as: COLACE Take 100 mg by mouth 2 (two) times daily. Hold for loose stool   famotidine 20 MG tablet Commonly known as: PEPCID Take 20 mg by mouth daily.   fish oil-omega-3 fatty acids 1000 MG capsule Take 1 g by mouth 2 (two) times daily. For hyperlipidemia   levothyroxine 75 MCG tablet Commonly known as: SYNTHROID Take 75 mcg by mouth daily. For hypothyroidism   lisinopril 2.5 MG tablet Commonly known as: ZESTRIL Take 2.5 mg by mouth daily.   memantine 10 MG tablet Commonly known as: NAMENDA Take 10 mg by mouth 2 (two) times daily. For dementia   Oyster Shell Calcium 500 MG Tabs Take 1 tablet by mouth daily.   polyethylene glycol 17 g packet Commonly known as: MIRALAX / GLYCOLAX Take 17 g by mouth daily. HOLD FOR LOOSE STOOLS   PreviDent 5000  Plus 1.1 % Crea dental cream Generic drug: sodium fluoride Place 1 application onto teeth every evening. BRUSH TEETH WITH TOOTHPASTE AFTER PM MOUTH CARE. SPIT OUT EXCESS AND DO NOT RINSE   rivaroxaban 20 MG Tabs tablet Commonly known as: XARELTO Take 20 mg by mouth daily with supper.   sodium chloride 1 g tablet Take 1 g by mouth See admin instructions. Take 1 tablet at breakfast and dinner, 2 tablets at lunch   Tylenol 8 Hour Arthritis Pain 650 MG CR tablet Generic drug: acetaminophen Take 650 mg by mouth every 12 (twelve) hours. For arthritis   venlafaxine XR 75 MG 24 hr capsule Commonly known as:  EFFEXOR-XR Take 75 mg by mouth daily with breakfast.   Vitamin D3 1.25 MG (50000 UT) Caps Take 1 capsule by mouth once a week.   zinc sulfate 220 (50 Zn) MG capsule Take 220 mg by mouth every other day.       No orders of the defined types were placed in this encounter.   Immunization History  Administered Date(s) Administered  . Influenza-Unspecified 04/26/2013, 05/29/2017, 04/11/2018, 04/22/2019  . Pneumococcal Conjugate-13 07/06/2017  . Pneumococcal Polysaccharide-23 01/05/2019    Social History   Tobacco Use  . Smoking status: Never Smoker  . Smokeless tobacco: Never Used  Substance Use Topics  . Alcohol use: No    Review of Systems  GENERAL:  no fevers, fatigue, appetite changes SKIN: No itching, rash HEENT: No complaint RESPIRATORY: No cough, wheezing, SOB CARDIAC: No chest pain, palpitations, lower extremity edema  GI: No abdominal pain, No N/V/D or constipation, No heartburn or reflux  GU: No dysuria, frequency or urgency, or incontinence  MUSCULOSKELETAL: No unrelieved bone/joint pain NEUROLOGIC: No headache, dizziness  PSYCHIATRIC: No overt anxiety or sadness  Vitals:   07/18/19 1540  BP: 126/65  Pulse: 73  Resp: 18  Temp: (!) 97.1 F (36.2 C)   There is no height or weight on file to calculate BMI. Physical Exam  GENERAL APPEARANCE: Alert, conversant, No acute distress  SKIN: No diaphoresis rash HEENT: Unremarkable RESPIRATORY: Breathing is even, unlabored. Lung sounds are clear   CARDIOVASCULAR: Heart RRR no murmurs, rubs or gallops. No peripheral edema  GASTROINTESTINAL: Abdomen is soft, non-tender, not distended w/ normal bowel sounds.  GENITOURINARY: Bladder non tender, not distended  MUSCULOSKELETAL: No abnormal joints or musculature NEUROLOGIC: Cranial nerves 2-12 grossly intact. Moves all extremities PSYCHIATRIC: Mood and affect appropriate to situation, no behavioral issues  Patient Active Problem List   Diagnosis Date Noted  .  Real time reverse transcriptase PCR positive for COVID-19 virus 03/02/2019  . Major depressive disorder, single episode, unspecified 06/20/2017  . Peripheral vascular disease, unspecified ([place]) 04/05/2017  . Vascular dementia without behavioral disturbance ([place]) 02/06/2017  . CKD (chronic kidney disease) stage 3, GFR 30-59 ml/min 04/14/2016  . Hyperlipidemia 03/17/2016  . Fever 05/10/2014  . Nausea with vomiting 03/14/2014  . Mouth pain 11/17/2013  . Anxiety state 02/25/2013  . Lethargy 02/25/2013  . Osteoarthritis 02/04/2013  . Edema 02/04/2013  . Other convulsions 11/27/2012  . Unspecified constipation 11/27/2012  . History of pulmonary embolism 10/12/2012  . Long term current use of anticoagulant therapy 10/12/2012  . E. coli UTI 10/12/2012  . Essential hypertension, benign 10/12/2012  . Depression 10/12/2012  . Hypothyroidism 10/12/2012  . Dementia, multi-infarct ([place]) 10/12/2012  . GERD (gastroesophageal reflux disease) 10/12/2012  . Psychosis ([place]) 10/12/2012  . Hypokalemia 10/12/2012  . Hyponatremia 10/12/2012    CMP  Component Value Date/Time   NA 136 (A) 04/08/2019 0000   NA 139 10/14/2017 0000   K 4.4 04/08/2019 0000   K 4.4 10/14/2017 0000   CL 97 (A) 04/08/2019 0000   CO2 28 (A) 04/08/2019 0000   GLUCOSE 89 08/28/2010 1845   BUN 28 (A) 04/08/2019 0000   CREATININE 0.8 04/08/2019 0000   CREATININE 0.84 10/14/2017 0000   CALCIUM 9.2 04/08/2019 0000   CALCIUM 9.1 10/14/2017 0000   PROT 6 10/14/2017 0000   ALBUMIN 4 10/14/2017 0000   AST 22 09/21/2018 0000   AST 22 09/21/2018 0000   AST 19 10/14/2017 0000   ALT 11 09/21/2018 0000   ALT 11 09/21/2018 0000   ALT 11 10/14/2017 0000   ALKPHOS 78 09/21/2018 0000   ALKPHOS 78 09/21/2018 0000   ALKPHOS 87 10/14/2017 0000   BILITOT 0.2 10/14/2017 0000   GFRNONAA 64.81 04/08/2019 0000   GFRAA 75.11 04/08/2019 0000   Recent Labs    01/06/19 0000 01/13/19 0000 04/08/19 0000  NA 133* 135* 136*  K 4.6 4.1  4.4  CL  --   --  97*  CO2  --   --  28*  BUN 21 19 28*  CREATININE 0.8 0.8 0.8  CALCIUM  --   --  9.2   Recent Labs    09/21/18 0000  AST 22  22  ALT 11  11  ALKPHOS 78  78   Recent Labs    09/21/18 0000 05/26/19 0000  WBC 5.1 4.1  HGB 13.0 13.8  HCT 38 41  PLT 227 193   Recent Labs    09/21/18 0000  CHOL 187  LDLCALC 103  TRIG 41   No results found for: [facility] Lab Results  Component Value Date   TSH 1.41 09/21/2018   Lab Results  Component Value Date   HGBA1C 5.3 10/14/2017   Lab Results  Component Value Date   CHOL 187 09/21/2018   HDL 75 (A) 09/21/2018   LDLCALC 103 09/21/2018   TRIG 41 09/21/2018    Significant Diagnostic Results in last 30 days:  No results found.  Assessment and Plan  GERD (gastroesophageal reflux disease) No reported problems; continue Pepcid 20 mg daily  Hypothyroidism TSH 1.41; continue Synthroid 75 mg daily  Vascular dementia without behavioral disturbance Continues without decline; continue Namenda 10 mg twice daily     Margit HanksAnne D Kimimila Tauzin, MD

## 2019-07-18 ENCOUNTER — Encounter: Payer: Self-pay | Admitting: Internal Medicine

## 2019-07-18 NOTE — Assessment & Plan Note (Signed)
TSH 1.41; continue Synthroid 75 mg daily

## 2019-07-18 NOTE — Assessment & Plan Note (Signed)
No reported problems; continue Pepcid 20 mg daily 

## 2019-07-18 NOTE — Assessment & Plan Note (Signed)
Continues without decline; continue Namenda 10 mg twice daily

## 2019-08-10 ENCOUNTER — Encounter: Payer: Self-pay | Admitting: Internal Medicine

## 2019-08-10 ENCOUNTER — Non-Acute Institutional Stay (SKILLED_NURSING_FACILITY): Payer: Medicare Other | Admitting: Internal Medicine

## 2019-08-10 DIAGNOSIS — M8949 Other hypertrophic osteoarthropathy, multiple sites: Secondary | ICD-10-CM

## 2019-08-10 DIAGNOSIS — F32A Depression, unspecified: Secondary | ICD-10-CM

## 2019-08-10 DIAGNOSIS — F329 Major depressive disorder, single episode, unspecified: Secondary | ICD-10-CM

## 2019-08-10 DIAGNOSIS — M159 Polyosteoarthritis, unspecified: Secondary | ICD-10-CM

## 2019-08-10 NOTE — Progress Notes (Addendum)
Location:  [facility] Room Number: 417-W Place of Service:  SNF (31)  Margit Hanks, MD  Patient Care Team: Margit Hanks, MD as PCP - General (Internal Medicine)  Extended Emergency Contact Information Primary Emergency Contact: Culclasure,[name]  [place] Home Phone: [phone] Relation: Other    Allergies: Ciprofloxacin hcl, Codeine, Duloxetine hcl, Hctz [hydrochlorothiazide], Metaxalone, Morphine and related, Olmesartan, Penicillins, Quetiapine fumarate, and Zofran Frazier Richards hcl]  Chief Complaint  Patient presents with  . Medical Management of Chronic Issues    Routine [facility] visit    HPI: Patient is an 86 y.o. female who is being seen for routine issues of osteoarthritis, chronic kidney disease stage II, and depression.  Past Medical History:  Diagnosis Date  . Anxiety state 02/25/2013  . Chronic kidney disease, stage III (moderate)   . CKD (chronic kidney disease) stage 3, GFR 30-59 ml/min 04/14/2016  . Dementia, multi-infarct (HCC) 10/12/2012  . Depression 10/12/2012  . Gastroesophageal reflux disease without esophagitis   . Generalized anxiety disorder   . GERD (gastroesophageal reflux disease) 10/12/2012  . History of falling   . History of pulmonary embolism 10/12/2012  . Hyperlipidemia   . Hypertension   . Hypo-osmolality and hyponatremia   . Hypokalemia   . Hypothyroid   . Long term current use of anticoagulant therapy 10/12/2012  . Major depressive disorder, single episode, unspecified   . OA (osteoarthritis)   . Old myocardial infarction   . Osteoarthritis 02/04/2013  . PE (pulmonary embolism)   . Peripheral vascular disease, unspecified (HCC)   . Vascular dementia without behavioral disturbance [facility])     Past Surgical History:  Procedure Laterality Date  . JOINT REPLACEMENT     B knees    Allergies as of 08/10/2019      Reactions   Ciprofloxacin Hcl    Codeine    Duloxetine Hcl    Hctz [hydrochlorothiazide]    Metaxalone    Morphine And Related    Olmesartan    Penicillins    Quetiapine Fumarate    Zofran [ondansetron Hcl]       Medication List       Accurate as of August 10, 2019 11:59 PM. If you have any questions, ask your nurse or doctor.        ARTIFICIAL TEARS OP Apply 1 drop to eye daily as needed (Eye irritation - both eyes).   ascorbic acid 500 MG tablet Commonly known as: VITAMIN C Take 500 mg by mouth daily.   Daily Vite Tabs Take 1 tablet by mouth daily.   docusate sodium 100 MG capsule Commonly known as: COLACE Take 100 mg by mouth 2 (two) times daily. Hold for loose stool   famotidine 20 MG tablet Commonly known as: PEPCID Take 20 mg by mouth daily.   fish oil-omega-3 fatty acids 1000 MG capsule Take 1 g by mouth 2 (two) times daily. For hyperlipidemia   levothyroxine 75 MCG tablet Commonly known as: SYNTHROID Take 75 mcg by mouth daily. For hypothyroidism   lisinopril 2.5 MG tablet Commonly known as: ZESTRIL Take 2.5 mg by mouth daily.   memantine 10 MG tablet Commonly known as: NAMENDA Take 10 mg by mouth 2 (two) times daily. For dementia   Oyster Shell Calcium 500 MG Tabs Take 1 tablet by mouth daily.   polyethylene glycol 17 g packet Commonly known as: MIRALAX / GLYCOLAX Take 17 g by mouth daily. HOLD FOR LOOSE STOOLS  PreviDent 5000 Plus 1.1 % Crea dental cream Generic drug: sodium fluoride Place 1 application onto teeth every evening. BRUSH TEETH WITH TOOTHPASTE AFTER PM MOUTH CARE. SPIT OUT EXCESS AND DO NOT RINSE   rivaroxaban 20 MG Tabs tablet Commonly known as: XARELTO Take 20 mg by mouth daily with supper.   sodium chloride 1 g tablet Take 1 g by mouth See admin instructions. Take 1 tablet at breakfast and dinner, 2 tablets at lunch   Tylenol 8 Hour Arthritis Pain 650 MG CR tablet Generic drug: acetaminophen Take 650 mg by mouth every 12 (twelve) hours. For arthritis   venlafaxine XR 75 MG 24 hr  capsule Commonly known as: EFFEXOR-XR Take 75 mg by mouth daily with breakfast.   Vitamin D3 1.25 MG (50000 UT) Caps Take 1 capsule by mouth once a week.   zinc sulfate 220 (50 Zn) MG capsule Take 220 mg by mouth every other day.       No orders of the defined types were placed in this encounter.   Immunization History  Administered Date(s) Administered  . Influenza-Unspecified 04/26/2013, 05/29/2017, 04/11/2018, 04/22/2019  . Pneumococcal Conjugate-13 07/06/2017  . Pneumococcal Polysaccharide-23 01/05/2019    Social History   Tobacco Use  . Smoking status: Never Smoker  . Smokeless tobacco: Never Used  Substance Use Topics  . Alcohol use: No    Review of Systems  GENERAL:  no fevers, fatigue, appetite changes SKIN: No itching, rash HEENT: No complaint RESPIRATORY: No cough, wheezing, SOB CARDIAC: No chest pain, palpitations, lower extremity edema  GI: No abdominal pain, No N/V/D or constipation, No heartburn or reflux  GU: No dysuria, frequency or urgency, or incontinence  MUSCULOSKELETAL: No unrelieved bone/joint pain NEUROLOGIC: No headache, dizziness  PSYCHIATRIC: No overt anxiety or sadness  Vitals:   08/10/19 0847  BP: 119/76  Pulse: 93  Resp: 18  Temp: (!) 97.4 F (36.3 C)   Body mass index is 32.02 kg/m. Physical Exam  GENERAL APPEARANCE: Alert, conversant, No acute distress  SKIN: No diaphoresis rash HEENT: Unremarkable RESPIRATORY: Breathing is even, unlabored. Lung sounds are clear   CARDIOVASCULAR: Heart RRR no murmurs, rubs or gallops. No peripheral edema  GASTROINTESTINAL: Abdomen is soft, non-tender, not distended w/ normal bowel sounds.  GENITOURINARY: Bladder non tender, not distended  MUSCULOSKELETAL: No abnormal joints or musculature NEUROLOGIC: Cranial nerves 2-12 grossly intact. Moves all extremities PSYCHIATRIC: Mood and affect appropriate to situation, no behavioral issues  Patient Active Problem List   Diagnosis Date Noted    . Real time reverse transcriptase PCR positive for COVID-19 virus 03/02/2019  . Major depressive disorder, single episode, unspecified 06/20/2017  . Peripheral vascular disease, unspecified (HCC) 04/05/2017  . Vascular dementia without behavioral disturbance (HCC) 02/06/2017  . Chronic kidney disease, stage II (mild) 04/14/2016  . Hyperlipidemia 03/17/2016  . Fever 05/10/2014  . Nausea with vomiting 03/14/2014  . Mouth pain 11/17/2013  . Anxiety state 02/25/2013  . Lethargy 02/25/2013  . Osteoarthritis 02/04/2013  . Edema 02/04/2013  . Other convulsions 11/27/2012  . Unspecified constipation 11/27/2012  . History of pulmonary embolism 10/12/2012  . Long term current use of anticoagulant therapy 10/12/2012  . E. coli UTI 10/12/2012  . Essential hypertension, benign 10/12/2012  . Depression 10/12/2012  . Hypothyroidism 10/12/2012  . Dementia, multi-infarct (HCC) 10/12/2012  . GERD (gastroesophageal reflux disease) 10/12/2012  . Psychosis (HCC) 10/12/2012  . Hypokalemia 10/12/2012  . Hyponatremia 10/12/2012    CMP     Component Value Date/Time  NA 136 (A) 04/08/2019 0000   NA 139 10/14/2017 0000   K 4.4 04/08/2019 0000   K 4.4 10/14/2017 0000   CL 97 (A) 04/08/2019 0000   CO2 28 (A) 04/08/2019 0000   GLUCOSE 89 08/28/2010 1845   BUN 28 (A) 04/08/2019 0000   CREATININE 0.8 04/08/2019 0000   CREATININE 0.84 10/14/2017 0000   CALCIUM 9.2 04/08/2019 0000   CALCIUM 9.1 10/14/2017 0000   PROT 6 10/14/2017 0000   ALBUMIN 4 10/14/2017 0000   AST 22 09/21/2018 0000   AST 22 09/21/2018 0000   AST 19 10/14/2017 0000   ALT 11 09/21/2018 0000   ALT 11 09/21/2018 0000   ALT 11 10/14/2017 0000   ALKPHOS 78 09/21/2018 0000   ALKPHOS 78 09/21/2018 0000   ALKPHOS 87 10/14/2017 0000   BILITOT 0.2 10/14/2017 0000   GFRNONAA 64.81 04/08/2019 0000   GFRAA 75.11 04/08/2019 0000   Recent Labs    01/06/19 0000 01/13/19 0000 04/08/19 0000  NA 133* 135* 136*  K 4.6 4.1 4.4  CL   --   --  97*  CO2  --   --  28*  BUN 21 19 28*  CREATININE 0.8 0.8 0.8  CALCIUM  --   --  9.2   Recent Labs    09/21/18 0000  AST 22  22  ALT 11  11  ALKPHOS 78  78   Recent Labs    09/21/18 0000 05/26/19 0000  WBC 5.1 4.1  HGB 13.0 13.8  HCT 38 41  PLT 227 193   Recent Labs    09/21/18 0000  CHOL 187  LDLCALC 103  TRIG 41   No results found for: [facility] Lab Results  Component Value Date   TSH 1.41 09/21/2018   Lab Results  Component Value Date   HGBA1C 5.3 10/14/2017   Lab Results  Component Value Date   CHOL 187 09/21/2018   HDL 75 (A) 09/21/2018   LDLCALC 103 09/21/2018   TRIG 41 09/21/2018    Significant Diagnostic Results in last 30 days:  No results found.  Assessment and Plan  Chronic kidney disease, stage II (mild) Most recent GFR 64 which puts patient in chronic kidney disease stage II; will continue to monitor elbows  Depression Chronic and stable; especially during the pandemic we will continue patient on Effexor 75 mg daily  Osteoarthritis Continues without complaint or problem; continue Tylenol 650 mg every 12 hours scheduled    [name], MD

## 2019-08-16 ENCOUNTER — Encounter: Payer: Self-pay | Admitting: Internal Medicine

## 2019-08-16 NOTE — Assessment & Plan Note (Signed)
Most recent GFR 64 which puts patient in chronic kidney disease stage II; will continue to monitor elbows

## 2019-08-16 NOTE — Assessment & Plan Note (Signed)
Chronic and stable; especially during the pandemic we will continue patient on Effexor 75 mg daily

## 2019-08-16 NOTE — Assessment & Plan Note (Signed)
Continues without complaint or problem; continue Tylenol 650 mg every 12 hours scheduled

## 2019-08-17 LAB — COMPREHENSIVE METABOLIC PANEL
Albumin: 3.7 (ref 3.5–5.0)
Calcium: 9.2 (ref 8.7–10.7)
GFR calc Af Amer: 80.84
GFR calc non Af Amer: 69.75
Globulin: 1.7

## 2019-08-17 LAB — HEPATIC FUNCTION PANEL
ALT: 17 (ref 7–35)
AST: 20 (ref 13–35)
Alkaline Phosphatase: 66 (ref 25–125)
Bilirubin, Total: 0.3

## 2019-08-17 LAB — CBC AND DIFFERENTIAL
HCT: 41 (ref 36–46)
Hemoglobin: 13.9 (ref 12.0–16.0)
Platelets: 194 (ref 150–399)
WBC: 4.3

## 2019-08-17 LAB — BASIC METABOLIC PANEL
BUN: 27 — AB (ref 4–21)
CO2: 30 — AB (ref 13–22)
Chloride: 97 — AB (ref 99–108)
Creatinine: 0.8 (ref 0.5–1.1)
Glucose: 83
Potassium: 5.2 (ref 3.4–5.3)
Sodium: 137 (ref 137–147)

## 2019-08-17 LAB — CBC: RBC: 4.4 (ref 3.87–5.11)

## 2019-08-17 LAB — LIPID PANEL
Cholesterol: 184 (ref 0–200)
HDL: 65 (ref 35–70)
LDL Cholesterol: 112
LDl/HDL Ratio: 2.8
Triglycerides: 34 — AB (ref 40–160)

## 2019-08-17 LAB — TSH: TSH: 1.5 (ref 0.41–5.90)

## 2019-08-26 LAB — COMPREHENSIVE METABOLIC PANEL
Calcium: 8.9 (ref 8.7–10.7)
GFR calc Af Amer: 90
GFR calc non Af Amer: 80.19

## 2019-08-26 LAB — BASIC METABOLIC PANEL
BUN: 21 (ref 4–21)
CO2: 28 — AB (ref 13–22)
Chloride: 98 — AB (ref 99–108)
Creatinine: 0.7 (ref 0.5–1.1)
Glucose: 86
Potassium: 4.2 (ref 3.4–5.3)
Sodium: 136 — AB (ref 137–147)

## 2019-09-02 ENCOUNTER — Other Ambulatory Visit: Payer: Self-pay

## 2019-09-02 NOTE — Patient Outreach (Signed)
[facility]) Care Management  09/02/2019  Tricia Martin 1931/02/10 [id number]   Medication Adherence call to Tricia Martin patient has a telephone number that belongs to some one else. Tricia Martin is showing past due on Lisinopril 2.5 mg under [facility] Ins.   Lillia Abed CPhT Pharmacy Technician [facility] Care Management Direct Dial [phone]  Fax [phone] [email or web address]

## 2019-09-14 ENCOUNTER — Non-Acute Institutional Stay (SKILLED_NURSING_FACILITY): Payer: Medicare Other | Admitting: Internal Medicine

## 2019-09-14 ENCOUNTER — Encounter: Payer: Self-pay | Admitting: Internal Medicine

## 2019-09-14 DIAGNOSIS — F0150 Vascular dementia without behavioral disturbance: Secondary | ICD-10-CM | POA: Diagnosis not present

## 2019-09-14 DIAGNOSIS — I739 Peripheral vascular disease, unspecified: Secondary | ICD-10-CM | POA: Diagnosis not present

## 2019-09-14 DIAGNOSIS — I10 Essential (primary) hypertension: Secondary | ICD-10-CM | POA: Diagnosis not present

## 2019-09-14 NOTE — Progress Notes (Signed)
Location:  [facility] [place] Room Number: 417-W Place of Service:  SNF (31)  Tricia Duos, MD  Patient Care Team: Tricia Duos, MD as PCP - General (Internal Medicine)  Extended Emergency Contact Information Primary Emergency Contact: Culclasure,[name]  [place] Phone: [phone] Relation: Other    Allergies: Ciprofloxacin hcl, Codeine, Duloxetine hcl, Hctz [hydrochlorothiazide], Metaxalone, Morphine and related, Olmesartan, Penicillins, Quetiapine fumarate, and Zofran [name] hcl]  Chief Complaint  Patient presents with  . Medical Management of Chronic Issues    Routine [facility] visit    HPI: Patient is an 86 y.o. female who is being seen for routine issues multi-infarct dementia, hypertension, and peripheral arterial disease.  Past Medical History:  Diagnosis Date  . Anxiety state 02/25/2013  . Chronic kidney disease, stage III (moderate)   . CKD (chronic kidney disease) stage 3, GFR 30-59 ml/min 04/14/2016  . Dementia, multi-infarct ([place]) 10/12/2012  . Depression 10/12/2012  . Gastroesophageal reflux disease without esophagitis   . Generalized anxiety disorder   . GERD (gastroesophageal reflux disease) 10/12/2012  . History of falling   . History of pulmonary embolism 10/12/2012  . Hyperlipidemia   . Hypertension   . Hypo-osmolality and hyponatremia   . Hypokalemia   . Hypothyroid   . Long term current use of anticoagulant therapy 10/12/2012  . Major depressive disorder, single episode, unspecified   . OA (osteoarthritis)   . Old myocardial infarction   . Osteoarthritis 02/04/2013  . PE (pulmonary embolism)   . Peripheral vascular disease, unspecified ([place])   . Vascular dementia without behavioral disturbance [facility])     Past Surgical History:  Procedure Laterality Date  . JOINT REPLACEMENT     B knees    Allergies as of 09/14/2019      Reactions   Ciprofloxacin Hcl    Codeine    Duloxetine Hcl     Hctz [hydrochlorothiazide]    Metaxalone    Morphine And Related    Olmesartan    Penicillins    Quetiapine Fumarate    Zofran [ondansetron Hcl]       Medication List       Accurate as of September 14, 2019 11:59 PM. If you have any questions, ask your nurse or doctor.        STOP taking these medications   lisinopril 2.5 MG tablet Commonly known as: ZESTRIL Stopped by: Inocencio Homes, MD     TAKE these medications   ARTIFICIAL TEARS OP Apply 1 drop to eye daily as needed (Eye irritation - both eyes).   ascorbic acid 500 MG tablet Commonly known as: VITAMIN C Take 500 mg by mouth daily.   Daily Vite Tabs Take 1 tablet by mouth daily.   docusate sodium 100 MG capsule Commonly known as: COLACE Take 100 mg by mouth 2 (two) times daily. Hold for loose stool   famotidine 20 MG tablet Commonly known as: PEPCID Take 20 mg by mouth daily.   fish oil-omega-3 fatty acids 1000 MG capsule Take 1 g by mouth 2 (two) times daily. For hyperlipidemia   levothyroxine 75 MCG tablet Commonly known as: SYNTHROID Take 75 mcg by mouth daily. For hypothyroidism   memantine 10 MG tablet Commonly known as: NAMENDA Take 10 mg by mouth 2 (two) times daily. For dementia   Oyster Shell Calcium 500 MG Tabs Take 1 tablet by mouth daily.   polyethylene glycol 17 g packet Commonly known as: MIRALAX / GLYCOLAX  Take 17 g by mouth daily. HOLD FOR LOOSE STOOLS   PreviDent 5000 Plus 1.1 % Crea dental cream Generic drug: sodium fluoride Place 1 application onto teeth every evening. BRUSH TEETH WITH TOOTHPASTE AFTER PM MOUTH CARE. SPIT OUT EXCESS AND DO NOT RINSE   rivaroxaban 20 MG Tabs tablet Commonly known as: XARELTO Take 20 mg by mouth daily with supper.   sodium chloride 1 g tablet Take 1 g by mouth See admin instructions. Take 1 tablet at breakfast and dinner, 2 tablets at lunch   Tylenol 8 Hour Arthritis Pain 650 MG CR tablet Generic drug: acetaminophen Take 650 mg by mouth  every 12 (twelve) hours. For arthritis   venlafaxine XR 75 MG 24 hr capsule Commonly known as: EFFEXOR-XR Take 75 mg by mouth daily with breakfast.   Vitamin D3 1.25 MG (50000 UT) Caps Take 1 capsule by mouth once a week.   zinc sulfate 220 (50 Zn) MG capsule Take 220 mg by mouth every other day.       No orders of the defined types were placed in this encounter.   Immunization History  Administered Date(s) Administered  . Influenza-Unspecified 04/26/2013, 05/29/2017, 04/11/2018, 04/22/2019  . Moderna SARS-COVID-2 Vaccination 07/22/2019, 08/19/2019  . Pneumococcal Conjugate-13 07/06/2017  . Pneumococcal Polysaccharide-23 01/05/2019, 04/29/2019    Social History   Tobacco Use  . Smoking status: Never Smoker  . Smokeless tobacco: Never Used  Substance Use Topics  . Alcohol use: No    Review of Systems   GENERAL:  no fevers, fatigue, appetite changes SKIN: No itching, rash HEENT: No complaint RESPIRATORY: No cough, wheezing, SOB CARDIAC: No chest pain, palpitations, lower extremity edema  GI: No abdominal pain, No N/V/D or constipation, No heartburn or reflux  GU: No dysuria, frequency or urgency, or incontinence  MUSCULOSKELETAL: No unrelieved bone/joint pain NEUROLOGIC: No headache, dizziness  PSYCHIATRIC: No overt anxiety or sadness  Vitals:   09/14/19 1323  BP: 137/77  Pulse: 84  Resp: 18  Temp: (!) 97.3 F (36.3 C)  SpO2: 95%   Body mass index is 31.83 kg/m. Physical Exam  GENERAL APPEARANCE: Alert, conversant, No acute distress  SKIN: No diaphoresis rash HEENT: Unremarkable RESPIRATORY: Breathing is even, unlabored. Lung sounds are clear   CARDIOVASCULAR: Heart RRR no murmurs, rubs or gallops. No peripheral edema  GASTROINTESTINAL: Abdomen is soft, non-tender, not distended w/ normal bowel sounds.  GENITOURINARY: Bladder non tender, not distended  MUSCULOSKELETAL: No abnormal joints or musculature NEUROLOGIC: Cranial nerves 2-12 grossly intact.  Moves all extremities PSYCHIATRIC: Mood and affect appropriate to situation, no behavioral issues  Patient Active Problem List   Diagnosis Date Noted  . Real time reverse transcriptase PCR positive for COVID-19 virus 03/02/2019  . Major depressive disorder, single episode, unspecified 06/20/2017  . Peripheral vascular disease, unspecified (HCC) 04/05/2017  . Vascular dementia without behavioral disturbance (HCC) 02/06/2017  . Chronic kidney disease, stage II (mild) 04/14/2016  . Hyperlipidemia 03/17/2016  . Fever 05/10/2014  . Nausea with vomiting 03/14/2014  . Mouth pain 11/17/2013  . Anxiety state 02/25/2013  . Lethargy 02/25/2013  . Osteoarthritis 02/04/2013  . Edema 02/04/2013  . Other convulsions 11/27/2012  . Unspecified constipation 11/27/2012  . History of pulmonary embolism 10/12/2012  . Long term current use of anticoagulant therapy 10/12/2012  . E. coli UTI 10/12/2012  . Essential hypertension, benign 10/12/2012  . Depression 10/12/2012  . Hypothyroidism 10/12/2012  . Dementia, multi-infarct (HCC) 10/12/2012  . GERD (gastroesophageal reflux disease) 10/12/2012  . Psychosis (  HCC) 10/12/2012  . Hypokalemia 10/12/2012  . Hyponatremia 10/12/2012    CMP     Component Value Date/Time   NA 137 08/17/2019 0000   NA 139 10/14/2017 0000   K 5.2 08/17/2019 0000   K 4.4 10/14/2017 0000   CL 97 (A) 08/17/2019 0000   CO2 30 (A) 08/17/2019 0000   GLUCOSE 89 08/28/2010 1845   BUN 27 (A) 08/17/2019 0000   CREATININE 0.8 08/17/2019 0000   CREATININE 0.84 10/14/2017 0000   CALCIUM 9.2 08/17/2019 0000   CALCIUM 9.1 10/14/2017 0000   PROT 6 10/14/2017 0000   ALBUMIN 3.7 08/17/2019 0000   ALBUMIN 4 10/14/2017 0000   AST 20 08/17/2019 0000   AST 19 10/14/2017 0000   ALT 17 08/17/2019 0000   ALT 11 10/14/2017 0000   ALKPHOS 66 08/17/2019 0000   ALKPHOS 87 10/14/2017 0000   BILITOT 0.2 10/14/2017 0000   GFRNONAA 69.75 08/17/2019 0000   GFRAA 80.84 08/17/2019 0000    Recent Labs    01/13/19 0000 04/08/19 0000 08/17/19 0000  NA 135* 136* 137  K 4.1 4.4 5.2  CL  --  97* 97*  CO2  --  28* 30*  BUN 19 28* 27*  CREATININE 0.8 0.8 0.8  CALCIUM  --  9.2 9.2   Recent Labs    09/21/18 0000 08/17/19 0000  AST 22  22 20   ALT 11  11 17   ALKPHOS 78  78 66  ALBUMIN  --  3.7   Recent Labs    09/21/18 0000 05/26/19 0000 08/17/19 0000  WBC 5.1 4.1 4.3  HGB 13.0 13.8 13.9  HCT 38 41 41  PLT 227 193 194   Recent Labs    09/21/18 0000 08/17/19 0000  CHOL 187 184  LDLCALC 103 112  TRIG 41 34*   No results found for: [facility] Lab Results  Component Value Date   TSH 1.50 08/17/2019   Lab Results  Component Value Date   HGBA1C 5.3 10/14/2017   Lab Results  Component Value Date   CHOL 184 08/17/2019   HDL 65 08/17/2019   LDLCALC 112 08/17/2019   TRIG 34 (A) 08/17/2019    Significant Diagnostic Results in last 30 days:  No results found.  Assessment and Plan  Dementia, multi-infarct Very stable; continue Aricept 10 mg nightly  Essential hypertension, benign Chronic and stable; continue lisinopril 2.5 mg daily  Peripheral vascular disease, unspecified (HCC) No new lesions; continue Xarelto 20 mg daily    08/19/2019, MD

## 2019-09-19 ENCOUNTER — Encounter: Payer: Self-pay | Admitting: Internal Medicine

## 2019-09-19 NOTE — Assessment & Plan Note (Signed)
Chronic and stable; continue lisinopril 2.5 mg daily

## 2019-09-19 NOTE — Assessment & Plan Note (Signed)
No new lesions; continue Xarelto 20 mg daily

## 2019-09-19 NOTE — Assessment & Plan Note (Signed)
Very stable; continue Aricept 10 mg nightly

## 2019-10-05 ENCOUNTER — Other Ambulatory Visit: Payer: Self-pay

## 2019-10-05 NOTE — Patient Outreach (Signed)
[facility]) Care Management  10/05/2019  Witney Huie [birth year] [id number]   Medication Adherence call to Mrs. Danella Penton patient is at an assistance living at this time. Mrs. Lichtenberg is showing past due on Lisinopril 2.5 mg under [facility] Ins.  Lillia Abed CPhT Pharmacy Technician [facility] Management Direct Dial [phone]  Fax [phone] [email or web address]

## 2019-10-06 LAB — VITAMIN D 25 HYDROXY (VIT D DEFICIENCY, FRACTURES): Vit D, 25-Hydroxy: 60

## 2019-10-18 ENCOUNTER — Non-Acute Institutional Stay (SKILLED_NURSING_FACILITY): Payer: Medicare Other | Admitting: Internal Medicine

## 2019-10-18 ENCOUNTER — Encounter: Payer: Self-pay | Admitting: Internal Medicine

## 2019-10-18 DIAGNOSIS — F324 Major depressive disorder, single episode, in partial remission: Secondary | ICD-10-CM | POA: Diagnosis not present

## 2019-10-18 DIAGNOSIS — Z86711 Personal history of pulmonary embolism: Secondary | ICD-10-CM | POA: Diagnosis not present

## 2019-10-18 DIAGNOSIS — E871 Hypo-osmolality and hyponatremia: Secondary | ICD-10-CM

## 2019-10-18 NOTE — [place] Notes (Signed)
Location:  [facility] [place] Room Number: 417-W Place of Service:  SNF (31)  [name], MD  Patient Care Team: [name], MD as PCP - General (Internal Medicine)  Extended Emergency Contact Information Primary Emergency Contact: Culclasure,[name]  [place] Phone: [phone] Relation: Other    Allergies: Ciprofloxacin hcl, Codeine, Duloxetine hcl, Hctz [hydrochlorothiazide], Metaxalone, Morphine and related, Olmesartan, Penicillins, Quetiapine fumarate, and Zofran [name] hcl]  Chief Complaint  Patient presents with  . Medical Management of Chronic Issues    Routine [facility] visit    HPI: Patient is an 86 y.o. female who is being seen for routine issues of history of PE, hyponatremia, and major depressive disorder.  Past Medical History:  Diagnosis Date  . Anxiety state 02/25/2013  . Chronic kidney disease, stage III (moderate)   . CKD (chronic kidney disease) stage 3, GFR 30-59 ml/min 04/14/2016  . Dementia, multi-infarct ([place]) 10/12/2012  . Depression 10/12/2012  . Gastroesophageal reflux disease without esophagitis   . Generalized anxiety disorder   . GERD (gastroesophageal reflux disease) 10/12/2012  . History of falling   . History of pulmonary embolism 10/12/2012  . Hyperlipidemia   . Hypertension   . Hypo-osmolality and hyponatremia   . Hypokalemia   . Hypothyroid   . Long term current use of anticoagulant therapy 10/12/2012  . Major depressive disorder, single episode, unspecified   . OA (osteoarthritis)   . Old myocardial infarction   . Osteoarthritis 02/04/2013  . PE (pulmonary embolism)   . Peripheral vascular disease, unspecified ([place])   . Vascular dementia without behavioral disturbance [facility])     Past Surgical History:  Procedure Laterality Date  . JOINT REPLACEMENT     B knees    Allergies as of 10/18/2019      Reactions   Ciprofloxacin Hcl    Codeine    Duloxetine Hcl    Hctz  [hydrochlorothiazide]    Metaxalone    Morphine And Related    Olmesartan    Penicillins    Quetiapine Fumarate    Zofran [ondansetron Hcl]       Medication List       Accurate as of October 18, 2019 11:59 PM. If you have any questions, ask your nurse or doctor.        ARTIFICIAL TEARS OP Apply 1 drop to eye daily as needed (Eye irritation - both eyes).   Daily Vite Tabs Take 1 tablet by mouth daily.   docusate sodium 100 MG capsule Commonly known as: COLACE Take 100 mg by mouth 2 (two) times daily. Hold for loose stool   famotidine 20 MG tablet Commonly known as: PEPCID Take 20 mg by mouth daily.   fish oil-omega-3 fatty acids 1000 MG capsule Take 1 g by mouth 2 (two) times daily. For hyperlipidemia   levothyroxine 75 MCG tablet Commonly known as: SYNTHROID Take 75 mcg by mouth daily. For hypothyroidism   memantine 10 MG tablet Commonly known as: NAMENDA Take 10 mg by mouth 2 (two) times daily. For dementia   Oyster Shell Calcium 500 MG Tabs Take 1 tablet by mouth daily.   polyethylene glycol 17 g packet Commonly known as: MIRALAX / GLYCOLAX Take 17 g by mouth daily. HOLD FOR LOOSE STOOLS   PreviDent 5000 Plus 1.1 % Crea dental cream Generic drug: sodium fluoride Place 1 application onto teeth every evening. BRUSH TEETH WITH TOOTHPASTE AFTER PM MOUTH CARE. SPIT OUT EXCESS AND DO  NOT RINSE   rivaroxaban 20 MG Tabs tablet Commonly known as: XARELTO Take 20 mg by mouth daily with supper.   sodium chloride 1 g tablet Take 1 g by mouth See admin instructions. Take 1 tablet at breakfast and dinner, 2 tablets at lunch   Tylenol 8 Hour Arthritis Pain 650 MG CR tablet Generic drug: acetaminophen Take 650 mg by mouth every 12 (twelve) hours. For arthritis   venlafaxine 37.5 MG tablet Commonly known as: EFFEXOR Take 37.5 mg by mouth daily. What changed: Another medication with the same name was removed. Continue taking this medication, and follow the directions  you see here. Changed by: Merrilee Seashore, MD   Vitamin D3 1.25 MG (50000 UT) Caps Take 1 capsule by mouth once a week.       No orders of the defined types were placed in this encounter.   Immunization History  Administered Date(s) Administered  . Influenza-Unspecified 04/26/2013, 05/29/2017, 04/11/2018, 04/22/2019  . Moderna SARS-COVID-2 Vaccination 07/22/2019, 08/19/2019  . Pneumococcal Conjugate-13 07/06/2017  . Pneumococcal Polysaccharide-23 01/05/2019, 04/29/2019    Social History   Tobacco Use  . Smoking status: Never Smoker  . Smokeless tobacco: Never Used  Substance Use Topics  . Alcohol use: No    Review of Systems   GENERAL:  no fevers, fatigue, appetite changes SKIN: No itching, rash HEENT: No complaint RESPIRATORY: No cough, wheezing, SOB CARDIAC: No chest pain, palpitations, lower extremity edema  GI: No abdominal pain, No N/V/D or constipation, No heartburn or reflux  GU: No dysuria, frequency or urgency, or incontinence  MUSCULOSKELETAL: No unrelieved bone/joint pain NEUROLOGIC: No headache, dizziness  PSYCHIATRIC: No overt anxiety or sadness  Vitals:   10/18/19 1635  BP: 119/73  Pulse: 73  Resp: 16  Temp: (!) 97 F (36.1 C)  SpO2: 99%   Body mass index is 32.15 kg/m. Physical Exam  GENERAL APPEARANCE: Alert, conversant, No acute distress abdomen: Her wheelchair SKIN: No diaphoresis rash HEENT: Unremarkable RESPIRATORY: Breathing is even, unlabored. Lung sounds are clear   CARDIOVASCULAR: Heart RRR no murmurs, rubs or gallops. No peripheral edema  GASTROINTESTINAL: Abdomen is soft, non-tender, not distended w/ normal bowel sounds.  GENITOURINARY: Bladder non tender, not distended  MUSCULOSKELETAL: No abnormal joints or musculature NEUROLOGIC: Cranial nerves 2-12 grossly intact. Moves all extremities PSYCHIATRIC: Mood and affect appropriate to situation with mild dementia, no behavioral issues  Patient Active Problem List   Diagnosis  Date Noted  . Real time reverse transcriptase PCR positive for COVID-19 virus 03/02/2019  . Major depressive disorder, single episode, unspecified 06/20/2017  . Peripheral vascular disease, unspecified (HCC) 04/05/2017  . Vascular dementia without behavioral disturbance (HCC) 02/06/2017  . Chronic kidney disease, stage II (mild) 04/14/2016  . Hyperlipidemia 03/17/2016  . Fever 05/10/2014  . Nausea with vomiting 03/14/2014  . Mouth pain 11/17/2013  . Anxiety state 02/25/2013  . Lethargy 02/25/2013  . Osteoarthritis 02/04/2013  . Edema 02/04/2013  . Other convulsions 11/27/2012  . Unspecified constipation 11/27/2012  . History of pulmonary embolism 10/12/2012  . Long term current use of anticoagulant therapy 10/12/2012  . E. coli UTI 10/12/2012  . Essential hypertension, benign 10/12/2012  . Depression 10/12/2012  . Hypothyroidism 10/12/2012  . Dementia, multi-infarct (HCC) 10/12/2012  . GERD (gastroesophageal reflux disease) 10/12/2012  . Psychosis (HCC) 10/12/2012  . Hypokalemia 10/12/2012  . Hyponatremia 10/12/2012    CMP     Component Value Date/Time   NA 136 (A) 08/26/2019 0000   NA 139 10/14/2017 0000  K 4.2 08/26/2019 0000   K 4.4 10/14/2017 0000   CL 98 (A) 08/26/2019 0000   CO2 28 (A) 08/26/2019 0000   GLUCOSE 89 08/28/2010 1845   BUN 21 08/26/2019 0000   CREATININE 0.7 08/26/2019 0000   CREATININE 0.84 10/14/2017 0000   CALCIUM 8.9 08/26/2019 0000   CALCIUM 9.1 10/14/2017 0000   PROT 6 10/14/2017 0000   ALBUMIN 3.7 08/17/2019 0000   ALBUMIN 4 10/14/2017 0000   AST 20 08/17/2019 0000   AST 19 10/14/2017 0000   ALT 17 08/17/2019 0000   ALT 11 10/14/2017 0000   ALKPHOS 66 08/17/2019 0000   ALKPHOS 87 10/14/2017 0000   BILITOT 0.2 10/14/2017 0000   GFRNONAA 80.19 08/26/2019 0000   GFRAA 90 08/26/2019 0000   Recent Labs    04/08/19 0000 08/17/19 0000 08/26/19 0000  NA 136* 137 136*  K 4.4 5.2 4.2  CL 97* 97* 98*  CO2 28* 30* 28*  BUN 28* 27* 21    CREATININE 0.8 0.8 0.7  CALCIUM 9.2 9.2 8.9   Recent Labs    08/17/19 0000  AST 20  ALT 17  ALKPHOS 66  ALBUMIN 3.7   Recent Labs    05/26/19 0000 08/17/19 0000  WBC 4.1 4.3  HGB 13.8 13.9  HCT 41 41  PLT 193 194   Recent Labs    08/17/19 0000  CHOL 184  LDLCALC 112  TRIG 34*   No results found for: [facility] Lab Results  Component Value Date   TSH 1.50 08/17/2019   Lab Results  Component Value Date   HGBA1C 5.3 10/14/2017   Lab Results  Component Value Date   CHOL 184 08/17/2019   HDL 65 08/17/2019   LDLCALC 112 08/17/2019   TRIG 34 (A) 08/17/2019    Significant Diagnostic Results in last 30 days:  No results found.  Assessment and Plan  History of pulmonary embolism Stable; continue Xarelto 20 mg daily  Hyponatremia Sodium 136; continue sodium chloride tablets 1 g each, 4 g daily  Major depressive disorder, single episode, unspecified Stable; continue Effexor 75 mg daily     Margit Hanks, MD   '

## 2019-10-24 ENCOUNTER — Encounter: Payer: Self-pay | Admitting: Internal Medicine

## 2019-10-24 NOTE — Assessment & Plan Note (Signed)
Sodium 136; continue sodium chloride tablets 1 g each, 4 g daily

## 2019-10-24 NOTE — Assessment & Plan Note (Signed)
Stable; continue Xarelto 20 mg daily

## 2019-10-24 NOTE — Assessment & Plan Note (Signed)
Stable; continue Effexor 75 mg daily

## 2020-06-02 ENCOUNTER — Encounter: Payer: Self-pay | Admitting: Internal Medicine

## 2020-06-02 ENCOUNTER — Non-Acute Institutional Stay (SKILLED_NURSING_FACILITY): Payer: Medicare Other | Admitting: Internal Medicine

## 2020-06-02 DIAGNOSIS — Z86711 Personal history of pulmonary embolism: Secondary | ICD-10-CM | POA: Diagnosis not present

## 2020-06-02 DIAGNOSIS — K219 Gastro-esophageal reflux disease without esophagitis: Secondary | ICD-10-CM

## 2020-06-02 DIAGNOSIS — I10 Essential (primary) hypertension: Secondary | ICD-10-CM

## 2020-06-02 DIAGNOSIS — F325 Major depressive disorder, single episode, in full remission: Secondary | ICD-10-CM

## 2020-06-02 DIAGNOSIS — Z789 Other specified health status: Secondary | ICD-10-CM | POA: Diagnosis not present

## 2020-06-02 DIAGNOSIS — M150 Primary generalized (osteo)arthritis: Secondary | ICD-10-CM

## 2020-06-02 DIAGNOSIS — F0150 Vascular dementia without behavioral disturbance: Secondary | ICD-10-CM | POA: Diagnosis not present

## 2020-06-02 DIAGNOSIS — E871 Hypo-osmolality and hyponatremia: Secondary | ICD-10-CM

## 2020-06-02 DIAGNOSIS — N182 Chronic kidney disease, stage 2 (mild): Secondary | ICD-10-CM

## 2020-06-02 DIAGNOSIS — E034 Atrophy of thyroid (acquired): Secondary | ICD-10-CM

## 2020-06-02 DIAGNOSIS — M8949 Other hypertrophic osteoarthropathy, multiple sites: Secondary | ICD-10-CM

## 2020-06-02 DIAGNOSIS — E785 Hyperlipidemia, unspecified: Secondary | ICD-10-CM

## 2020-06-02 DIAGNOSIS — M159 Polyosteoarthritis, unspecified: Secondary | ICD-10-CM

## 2020-06-02 NOTE — Progress Notes (Signed)
Location:  [facility]   Place of Service:  SNF [phone]) Provider:  Cristian Grieves L. Renato Gails, D.O., C.M.D.  Kermit Balo, DO  Patient Care Team: Kermit Balo, DO as PCP - General (Geriatric Medicine)  Extended Emergency Contact Information Primary Emergency Contact: Bennie [place] Home Phone: [phone] Relation: Other Secondary Emergency Contact: Munch,James  [place] Home Phone: [phone] Relation: Son  Code Status:  FULL Goals of care: Advanced Directive information Advanced Directives 05/31/2019  Does Patient Have a Medical Advance Directive? Yes  Type of Advance Directive (No Data)  Does patient want to make changes to medical advance directive? -  Copy of Healthcare Power of Attorney in Chart? -  Would patient like information on creating a medical advance directive? -     Chief Complaint  Patient presents with  . Medical Management of Chronic Issues    med mgt of chronic diseases, [facility]    HPI:  Pt is a 86 y.o. female seen today for medical management of chronic diseases.  She is followed by [facility] NP.  She has a h/o PE on xarelto, dementia on namenda, PVD, gerd, hypothyroid, CKD3, prior covid infection, psychosis, depression, hyponatremia on sodium tablets.  She is here for long-term care.  When seen, she was up and about talking with other residents and staff, self-propelling her wheelchair.  She was pleasantly confused and talked about things from her past.  She denied any current pain or complaints--says different things ache from time to time, but "nothing that stays with me".  She eats well and sleeps well.    Reviewing NP note, her effexor had been dose reduced in august and she had been tolerating that well.  Her depression remained in remission and still appears this way today.  She typically is out of bed each day and takes her meds as provided.  Staff had no concerns about her today either.     Past Medical History:  Diagnosis Date  . Anxiety state 02/25/2013  . Chronic kidney disease, stage III (moderate) (HCC)   . CKD (chronic kidney disease) stage 3, GFR 30-59 ml/min (HCC) 04/14/2016  . Dementia, multi-infarct (HCC) 10/12/2012  . Depression 10/12/2012  . Gastroesophageal reflux disease without esophagitis   . Generalized anxiety disorder   . GERD (gastroesophageal reflux disease) 10/12/2012  . History of falling   . History of pulmonary embolism 10/12/2012  . Hyperlipidemia   . Hypertension   . Hypo-osmolality and hyponatremia   . Hypokalemia   . Hypothyroid   . Long term current use of anticoagulant therapy 10/12/2012  . Major depressive disorder, single episode, unspecified   . OA (osteoarthritis)   . Old myocardial infarction   . Osteoarthritis 02/04/2013  . PE (pulmonary embolism)   . Peripheral vascular disease, unspecified (HCC)   . Vascular dementia without behavioral disturbance [facility])    Past Surgical History:  Procedure Laterality Date  . JOINT REPLACEMENT     B knees    Allergies  Allergen Reactions  . Ciprofloxacin Hcl   . Codeine   . Duloxetine Hcl   . Hctz [Hydrochlorothiazide]   . Metaxalone   . Morphine And Related   . Olmesartan   . Penicillins   . Quetiapine Fumarate   . Zofran [Ondansetron Hcl]     Outpatient Encounter Medications as of 06/02/2020  Medication Sig  . acetaminophen (TYLENOL 8 HOUR ARTHRITIS PAIN) 650 MG CR tablet Take 650 mg by mouth every  12 (twelve) hours. For arthritis   . Carboxymethylcellulose Sodium (ARTIFICIAL TEARS OP) Apply 1 drop to eye daily as needed (Eye irritation - both eyes).   . Cholecalciferol (VITAMIN D3) 1.25 MG (50000 UT) CAPS Take 1 capsule by mouth once a week.  . docusate sodium (COLACE) 100 MG capsule Take 100 mg by mouth 2 (two) times daily. Hold for loose stool  . famotidine (PEPCID) 20 MG tablet Take 20 mg by mouth daily.   . fish oil-omega-3 fatty acids 1000 MG capsule Take 1 g by mouth 2 (two)  times daily. For hyperlipidemia  . levothyroxine (SYNTHROID, LEVOTHROID) 75 MCG tablet Take 75 mcg by mouth daily. For hypothyroidism  . memantine (NAMENDA) 10 MG tablet Take 10 mg by mouth 2 (two) times daily. For dementia  . Multiple Vitamin (DAILY VITE) TABS Take 1 tablet by mouth daily.   Ethelda Chick Calcium 500 MG TABS Take 1 tablet by mouth daily.   . polyethylene glycol (MIRALAX / GLYCOLAX) packet Take 17 g by mouth daily. HOLD FOR LOOSE STOOLS  . rivaroxaban (XARELTO) 20 MG TABS tablet Take 20 mg by mouth daily with supper.   . sodium chloride 1 G tablet Take 1 g by mouth See admin instructions. Take 1 tablet at breakfast and dinner, 2 tablets at lunch  . sodium fluoride (PREVIDENT 5000 PLUS) 1.1 % CREA dental cream Place 1 application onto teeth every evening. BRUSH TEETH WITH TOOTHPASTE AFTER PM MOUTH CARE. SPIT OUT EXCESS AND DO NOT RINSE  . venlafaxine (EFFEXOR) 37.5 MG tablet Take 37.5 mg by mouth daily.   No facility-administered encounter medications on file as of 06/02/2020.    ROS  Immunization History  Administered Date(s) Administered  . Influenza-Unspecified 04/26/2013, 05/29/2017, 04/11/2018, 04/22/2019  . Moderna SARS-COVID-2 Vaccination 07/22/2019, 08/19/2019  . Pneumococcal Conjugate-13 07/06/2017  . Pneumococcal Polysaccharide-23 01/05/2019, 04/29/2019   Pertinent  Health Maintenance Due  Topic Date Due  . INFLUENZA VACCINE  02/20/2020  . DEXA SCAN  Completed  . PNA vac Low Risk Adult  Completed   Fall Risk  02/13/2018 02/12/2017  Falls in the past year? No No   Functional Status Survey:    Vitals:   06/02/20 1616  BP: 134/84  Pulse: 67  Resp: 18  Temp: 98 F (36.7 C)  Weight: 204 lb 6.4 oz (92.7 kg)  Height: 5\' 6"  (1.676 m)   Body mass index is 32.99 kg/m. Physical Exam Vitals reviewed.  Constitutional:      General: She is not in acute distress.    Appearance: Normal appearance. She is obese. She is not toxic-appearing.  HENT:     Head:  Normocephalic and atraumatic.     Right Ear: External ear normal.     Left Ear: External ear normal.     Nose: Nose normal.     Mouth/Throat:     Pharynx: Oropharynx is clear.  Eyes:     Extraocular Movements: Extraocular movements intact.     Conjunctiva/sclera: Conjunctivae normal.     Pupils: Pupils are equal, round, and reactive to light.     Comments: glasses  Cardiovascular:     Rate and Rhythm: Normal rate and regular rhythm.     Pulses: Normal pulses.     Heart sounds: Normal heart sounds.  Pulmonary:     Effort: Pulmonary effort is normal.     Breath sounds: Normal breath sounds. No wheezing, rhonchi or rales.  Abdominal:     General: Bowel sounds are normal. There  is no distension.     Palpations: Abdomen is soft.     Tenderness: There is no abdominal tenderness. There is no guarding or rebound.  Musculoskeletal:        General: Normal range of motion.     Cervical back: Neck supple.     Right lower leg: No edema.     Left lower leg: No edema.  Lymphadenopathy:     Cervical: No cervical adenopathy.  Skin:    General: Skin is warm and dry.  Neurological:     General: No focal deficit present.     Mental Status: She is alert.     Motor: Weakness present.     Gait: Gait abnormal.     Comments: Self-propels her wheelchair  Psychiatric:        Mood and Affect: Mood normal.     Comments: Pleasantly confused, enjoys talking     Labs reviewed: Recent Labs    08/17/19 0000 08/26/19 0000  NA 137 136*  K 5.2 4.2  CL 97* 98*  CO2 30* 28*  BUN 27* 21  CREATININE 0.8 0.7  CALCIUM 9.2 8.9   Recent Labs    08/17/19 0000  AST 20  ALT 17  ALKPHOS 66  ALBUMIN 3.7   Recent Labs    08/17/19 0000  WBC 4.3  HGB 13.9  HCT 41  PLT 194   Lab Results  Component Value Date   TSH 1.50 08/17/2019   Lab Results  Component Value Date   HGBA1C 5.3 10/14/2017   Lab Results  Component Value Date   CHOL 184 08/17/2019   HDL 65 08/17/2019   LDLCALC 112  08/17/2019   TRIG 34 (A) 08/17/2019    Assessment/Plan 1. Multi-infarct dementia without behavioral disturbance (HCC) -friendly and sociable, continue supportive care in SNF for ADLs -continues on namenda full dose  2. Full code status - Full code  3. History of pulmonary embolism -remains on xarelto long-term  4. Hyponatremia -last abstracted sodium from feb was ok at 136 -volume status at present euvolemic -continues on sodium tablets 1/2/1 with meals  5. Major depressive disorder with single episode, in full remission (HCC) -continue on reduced dose of effexor which she's tolerating well  6. Primary osteoarthritis involving multiple joints -did not report any regular pains today, but is on scheduled tylenol which we continued  7. Hypothyroidism due to acquired atrophy of thyroid -cont levothyroxine daily Lab Results  Component Value Date   TSH 1.50 08/17/2019   8. Essential hypertension, benign -bp well controlled w/o meds even on effexor  9. Gastroesophageal reflux disease without esophagitis -cont pepcid daily  10. Chronic kidney disease, stage II (mild) -Avoid nephrotoxic agents like nsaids, dose adjust renally excreted meds, hydrate.  11. Hyperlipidemia, unspecified hyperlipidemia type -no longer on tx at her advanced age and with her dementia and adl dependence  Family/ staff Communication: d/w snf nurse and reviewed optum np notes  Labs/tests ordered:  No new  Mela Perham L. Shomari Scicchitano, D.O. Geriatrics [facility] [address], [place] [address] Cell Phone (Mon-Fri 8am-5pm):  [phone] On Call:  [phone] & follow prompts after 5pm & weekends Office Phone:  [phone] Office Fax:  [phone]

## 2020-09-11 ENCOUNTER — Encounter: Payer: Self-pay | Admitting: Internal Medicine
